# Patient Record
Sex: Female | Born: 1960 | Race: White | Hispanic: No | Marital: Married | Smoking: Never smoker
Health system: Southern US, Community
[De-identification: ages and names within clinical notes are randomized; demographics above are authoritative.]

## PROBLEM LIST (undated history)

## (undated) DIAGNOSIS — L309 Dermatitis, unspecified: Secondary | ICD-10-CM

## (undated) DIAGNOSIS — Z9289 Personal history of other medical treatment: Secondary | ICD-10-CM

## (undated) DIAGNOSIS — I10 Essential (primary) hypertension: Secondary | ICD-10-CM

## (undated) DIAGNOSIS — B029 Zoster without complications: Secondary | ICD-10-CM

## (undated) DIAGNOSIS — K649 Unspecified hemorrhoids: Secondary | ICD-10-CM

## (undated) DIAGNOSIS — Z8041 Family history of malignant neoplasm of ovary: Secondary | ICD-10-CM

## (undated) DIAGNOSIS — Z1379 Encounter for other screening for genetic and chromosomal anomalies: Secondary | ICD-10-CM

## (undated) DIAGNOSIS — L409 Psoriasis, unspecified: Secondary | ICD-10-CM

## (undated) HISTORY — DX: Personal history of other medical treatment: Z92.89

## (undated) HISTORY — DX: Unspecified hemorrhoids: K64.9

## (undated) HISTORY — DX: Zoster without complications: B02.9

## (undated) HISTORY — DX: Dermatitis, unspecified: L30.9

## (undated) HISTORY — DX: Essential (primary) hypertension: I10

## (undated) HISTORY — DX: Psoriasis, unspecified: L40.9

## (undated) HISTORY — DX: Encounter for other screening for genetic and chromosomal anomalies: Z13.79

## (undated) HISTORY — DX: Family history of malignant neoplasm of ovary: Z80.41

---

## 2004-11-10 ENCOUNTER — Ambulatory Visit: Payer: Self-pay | Admitting: Unknown Physician Specialty

## 2004-11-26 ENCOUNTER — Ambulatory Visit: Payer: Self-pay | Admitting: Unknown Physician Specialty

## 2005-07-05 ENCOUNTER — Ambulatory Visit: Payer: Self-pay | Admitting: Unknown Physician Specialty

## 2005-12-13 ENCOUNTER — Ambulatory Visit: Payer: Self-pay | Admitting: Unknown Physician Specialty

## 2006-10-24 ENCOUNTER — Emergency Department: Payer: Self-pay | Admitting: Emergency Medicine

## 2006-12-16 ENCOUNTER — Ambulatory Visit: Payer: Self-pay | Admitting: Unknown Physician Specialty

## 2007-10-08 ENCOUNTER — Emergency Department: Payer: Self-pay | Admitting: Emergency Medicine

## 2013-07-19 ENCOUNTER — Ambulatory Visit: Payer: Self-pay | Admitting: Podiatry

## 2013-07-25 ENCOUNTER — Encounter: Payer: Self-pay | Admitting: Podiatry

## 2013-07-25 ENCOUNTER — Ambulatory Visit (INDEPENDENT_AMBULATORY_CARE_PROVIDER_SITE_OTHER): Payer: 59 | Admitting: Podiatry

## 2013-07-25 VITALS — BP 144/97 | HR 76 | Resp 16 | Ht 67.0 in | Wt 190.0 lb

## 2013-07-25 DIAGNOSIS — Z79899 Other long term (current) drug therapy: Secondary | ICD-10-CM

## 2013-07-25 MED ORDER — TERBINAFINE HCL 250 MG PO TABS: 250.0000 mg | ORAL_TABLET | Freq: Every day | ORAL | Status: DC

## 2013-07-25 NOTE — Progress Notes (Signed)
Here to get results of the toenail that was sent off and to find out what the next step is.  Objective: No change in the onychomycosis of the nail.  Assessment onychomycosis confirmed by mycology report.  Plan: We discussed the etiology pathology conservative versus surgical therapies. We discussed the use of orals versus topicals we also discussed the use of laser. At this point we have decided oral medication will be best for her. She will be given 250 mg of Lamisil 30 in number she will take 1 at night. She will also have liver profile and CBC performed for [place] I will followup with her in one month for another CBC and liver profile at which time we will right a prescription for 90 tablets. Should her blood work come back abnormal initially we will call her immediately.

## 2013-07-26 LAB — HEPATIC FUNCTION PANEL: Bilirubin, Direct: 0.16 mg/dL (ref 0.00–0.40)

## 2013-07-26 LAB — CBC WITH DIFFERENTIAL/PLATELET
Basophils Absolute: 0 10*3/uL (ref 0.0–0.2)
Basos: 0 %
EOS ABS: 0.1 10*3/uL (ref 0.0–0.4)
EOS: 1 %
HCT: 40.9 % (ref 34.0–46.6)
Hemoglobin: 13.9 g/dL (ref 11.1–15.9)
IMMATURE GRANS (ABS): 0 10*3/uL (ref 0.0–0.1)
Immature Granulocytes: 0 %
LYMPHS ABS: 1.7 10*3/uL (ref 0.7–3.1)
Lymphs: 25 %
MCH: 29.6 pg (ref 26.6–33.0)
MCHC: 34 g/dL (ref 31.5–35.7)
MCV: 87 fL (ref 79–97)
MONOS ABS: 0.6 10*3/uL (ref 0.1–0.9)
Monocytes: 8 %
NEUTROS ABS: 4.4 10*3/uL (ref 1.4–7.0)
Neutrophils Relative %: 66 %
RBC: 4.69 x10E6/uL (ref 3.77–5.28)
RDW: 14.5 % (ref 12.3–15.4)
WBC: 6.9 10*3/uL (ref 3.4–10.8)

## 2013-08-02 ENCOUNTER — Telehealth: Payer: Self-pay | Admitting: *Deleted

## 2013-08-02 NOTE — Telephone Encounter (Signed)
SPOKE TO PATIENT LETTING HER KNOW BLOODWORK WAS OK

## 2013-08-22 ENCOUNTER — Ambulatory Visit: Payer: 59 | Admitting: Podiatry

## 2013-08-23 ENCOUNTER — Ambulatory Visit (INDEPENDENT_AMBULATORY_CARE_PROVIDER_SITE_OTHER): Payer: 59 | Admitting: Podiatry

## 2013-08-23 VITALS — Resp 16 | Ht 67.0 in | Wt 191.0 lb

## 2013-08-23 DIAGNOSIS — Z79899 Other long term (current) drug therapy: Secondary | ICD-10-CM

## 2013-08-23 MED ORDER — TERBINAFINE HCL 250 MG PO TABS: 250.0000 mg | ORAL_TABLET | Freq: Every day | ORAL | Status: DC

## 2013-08-23 NOTE — Progress Notes (Signed)
She presents today for followup of her Lamisil therapy. She states that everything seems to be doing great of the nail is looking better. The a problem and she relates having his knee pain which is relieved by anti-inflammatories.  Objective: Vital signs are stable she is alert and oriented x3 there does appear to be nail changes to the hallux right.  Assessment: Long-term therapy with 4 onychomycosis with Lamisil.  Plan: Send her for another liver profile and CBC. She was prescribed another 90 days of Lamisil 250 mg tablets one by mouth daily. I will followup with her in 4 months. We will notify her with her results should be liver enzymes be elevated.

## 2013-08-24 LAB — HEPATIC FUNCTION PANEL
ALBUMIN: 4.3 g/dL (ref 3.5–5.5)
ALK PHOS: 67 IU/L (ref 39–117)
ALT: 10 IU/L (ref 0–32)
AST: 14 IU/L (ref 0–40)
Bilirubin, Direct: 0.17 mg/dL (ref 0.00–0.40)
Total Bilirubin: 0.6 mg/dL (ref 0.0–1.2)
Total Protein: 6.4 g/dL (ref 6.0–8.5)

## 2013-08-24 LAB — CBC WITH DIFFERENTIAL/PLATELET
Basophils Absolute: 0 10*3/uL (ref 0.0–0.2)
Basos: 1 %
Eos: 2 %
Eosinophils Absolute: 0.1 10*3/uL (ref 0.0–0.4)
HEMATOCRIT: 41.2 % (ref 34.0–46.6)
Hemoglobin: 13.5 g/dL (ref 11.1–15.9)
Immature Grans (Abs): 0 10*3/uL (ref 0.0–0.1)
Immature Granulocytes: 0 %
LYMPHS ABS: 0.8 10*3/uL (ref 0.7–3.1)
Lymphs: 17 %
MCH: 29.5 pg (ref 26.6–33.0)
MCHC: 32.8 g/dL (ref 31.5–35.7)
MCV: 90 fL (ref 79–97)
MONOCYTES: 14 %
MONOS ABS: 0.7 10*3/uL (ref 0.1–0.9)
NEUTROS ABS: 3.3 10*3/uL (ref 1.4–7.0)
Neutrophils Relative %: 66 %
RBC: 4.58 x10E6/uL (ref 3.77–5.28)
RDW: 14.9 % (ref 12.3–15.4)
WBC: 4.9 10*3/uL (ref 3.4–10.8)

## 2013-08-28 ENCOUNTER — Telehealth: Payer: Self-pay | Admitting: *Deleted

## 2013-08-28 NOTE — Telephone Encounter (Signed)
Message copied by Laury Axon on Tue Aug 28, 2013 10:24 AM ------      Message from: Tyson Dense T      Created: Tue Aug 28, 2013  7:05 AM       Blood work looks great. Continue with meds. ------

## 2013-08-28 NOTE — Telephone Encounter (Signed)
Left message letting pt know per dr Milinda Pointer blood work looks great and continue with medication.

## 2013-12-26 ENCOUNTER — Ambulatory Visit: Payer: 59 | Admitting: Podiatry

## 2014-01-07 ENCOUNTER — Ambulatory Visit (INDEPENDENT_AMBULATORY_CARE_PROVIDER_SITE_OTHER): Payer: 59 | Admitting: Podiatry

## 2014-01-07 VITALS — BP 123/80 | HR 68 | Resp 16

## 2014-01-07 DIAGNOSIS — Z79899 Other long term (current) drug therapy: Secondary | ICD-10-CM

## 2014-01-07 MED ORDER — TAVABOROLE 5 % EX SOLN: CUTANEOUS | Status: DC

## 2014-01-07 NOTE — Progress Notes (Signed)
She states that she had to stop taking her Lamisil: Started to bother her knees.  Objective: No change in the nail plate as of yet.  Assessment: Long-term therapy for the treatment onychomycosis we were using Lamisil but has discontinued it  Plan: Started her on a topical and I will followup with her in 6 months. If she would like to try the oral once again we will send her for another blood work

## 2014-05-24 DIAGNOSIS — K649 Unspecified hemorrhoids: Secondary | ICD-10-CM

## 2014-05-24 HISTORY — DX: Unspecified hemorrhoids: K64.9

## 2014-07-10 ENCOUNTER — Ambulatory Visit: Payer: 59 | Admitting: Podiatry

## 2014-09-22 DIAGNOSIS — Z1379 Encounter for other screening for genetic and chromosomal anomalies: Secondary | ICD-10-CM

## 2014-09-22 HISTORY — DX: Encounter for other screening for genetic and chromosomal anomalies: Z13.79

## 2014-10-18 DIAGNOSIS — Z9289 Personal history of other medical treatment: Secondary | ICD-10-CM

## 2014-10-18 DIAGNOSIS — Z8041 Family history of malignant neoplasm of ovary: Secondary | ICD-10-CM

## 2014-10-18 HISTORY — DX: Personal history of other medical treatment: Z92.89

## 2014-10-18 HISTORY — DX: Family history of malignant neoplasm of ovary: Z80.41

## 2014-10-18 LAB — HM PAP SMEAR: HM Pap smear: NEGATIVE

## 2014-10-22 ENCOUNTER — Encounter: Payer: Self-pay | Admitting: *Deleted

## 2014-10-23 HISTORY — PX: BREAST BIOPSY: SHX20

## 2014-10-24 ENCOUNTER — Other Ambulatory Visit: Payer: Self-pay | Admitting: Obstetrics and Gynecology

## 2014-10-24 DIAGNOSIS — R928 Other abnormal and inconclusive findings on diagnostic imaging of breast: Secondary | ICD-10-CM

## 2014-10-30 ENCOUNTER — Ambulatory Visit
Admission: RE | Admit: 2014-10-30 | Discharge: 2014-10-30 | Disposition: A | Discharge status: Home / Self Care | Payer: 59 | Source: Ambulatory Visit | Attending: Obstetrics and Gynecology | Admitting: Obstetrics and Gynecology

## 2014-10-30 ENCOUNTER — Encounter: Payer: Self-pay | Admitting: General Surgery

## 2014-10-30 ENCOUNTER — Ambulatory Visit (INDEPENDENT_AMBULATORY_CARE_PROVIDER_SITE_OTHER): Payer: 59 | Admitting: General Surgery

## 2014-10-30 VITALS — BP 128/80 | HR 78 | Resp 14 | Ht 65.0 in | Wt 170.0 lb

## 2014-10-30 DIAGNOSIS — N6002 Solitary cyst of left breast: Secondary | ICD-10-CM | POA: Diagnosis not present

## 2014-10-30 DIAGNOSIS — R928 Other abnormal and inconclusive findings on diagnostic imaging of breast: Secondary | ICD-10-CM | POA: Diagnosis present

## 2014-10-30 DIAGNOSIS — Z1211 Encounter for screening for malignant neoplasm of colon: Secondary | ICD-10-CM

## 2014-10-30 DIAGNOSIS — K649 Unspecified hemorrhoids: Secondary | ICD-10-CM | POA: Diagnosis not present

## 2014-10-30 DIAGNOSIS — K625 Hemorrhage of anus and rectum: Secondary | ICD-10-CM

## 2014-10-30 LAB — POC HEMOCCULT BLD/STL (OFFICE/1-CARD/DIAGNOSTIC): FECAL OCCULT BLD: NEGATIVE

## 2014-10-30 MED ORDER — POLYETHYLENE GLYCOL 3350 17 GM/SCOOP PO POWD: 1.0000 | Freq: Once | ORAL | Status: DC

## 2014-10-30 NOTE — Patient Instructions (Addendum)
Colonoscopy A colonoscopy is an exam to look at the entire large intestine (colon). This exam can help find problems such as tumors, polyps, inflammation, and areas of bleeding. The exam takes about 1 hour.  LET [facility] CARE PROVIDER KNOW ABOUT:   Any allergies you have.  All medicines you are taking, including vitamins, herbs, eye drops, creams, and over-the-counter medicines.  Previous problems you or members of your family have had with the use of anesthetics.  Any blood disorders you have.  Previous surgeries you have had.  Medical conditions you have. RISKS AND COMPLICATIONS  Generally, this is a safe procedure. However, as with any procedure, complications can occur. Possible complications include:  Bleeding.  Tearing or rupture of the colon wall.  Reaction to medicines given during the exam.  Infection (rare). BEFORE THE PROCEDURE   Ask your health care provider about changing or stopping your regular medicines.  You may be prescribed an oral bowel prep. This involves drinking a large amount of medicated liquid, starting the day before your procedure. The liquid will cause you to have multiple loose stools until your stool is almost clear or light green. This cleans out your colon in preparation for the procedure.  Do not eat or drink anything else once you have started the bowel prep, unless your health care provider tells you it is safe to do so.  Arrange for someone to drive you home after the procedure. PROCEDURE   You will be given medicine to help you relax (sedative).  You will lie on your side with your knees bent.  A long, flexible tube with a light and camera on the end (colonoscope) will be inserted through the rectum and into the colon. The camera sends video back to a computer screen as it moves through the colon. The colonoscope also releases carbon dioxide gas to inflate the colon. This helps your health care provider see the area better.  During  the exam, your health care provider may take a small tissue sample (biopsy) to be examined under a microscope if any abnormalities are found.  The exam is finished when the entire colon has been viewed. AFTER THE PROCEDURE   Do not drive for 24 hours after the exam.  You may have a small amount of blood in your stool.  You may pass moderate amounts of gas and have mild abdominal cramping or bloating. This is caused by the gas used to inflate your colon during the exam.  Ask when your test results will be ready and how you will get your results. Make sure you get your test results. Document Released: 05/07/2000 Document Revised: 02/28/2013 Document Reviewed: 01/15/2013 [facility] Patient Information 2015 [place], [place]. This information is not intended to replace advice given to you by your health care provider. Make sure you discuss any questions you have with your health care provider.  Patient is scheduled for a colonoscopy and surgery at [facility] on 11/20/14. She will pre admit by phone. Patient is aware to pre register with the hospital at least two days prior. She will stop her fish oil one week prior. Miralax prescription has been sent into her pharmacy. Patient is aware of date and instructions.

## 2014-10-30 NOTE — Progress Notes (Signed)
Patient ID: Amber Henson, female   DOB: 1960/11/19, 53 y.o.   MRN: [id number]  Chief Complaint  Patient presents with  . Colonoscopy    HPI Amber Henson is a 54 y.o. female.  Here today to discuss having a colonoscopy. Bowels move every other day. She has noticed occasional bleeding of hemorrhoids with straining. She does admit to some rectal itching from the external hemorrhoids. She was seen here in 2008 for hemorrhoids.  Denies any gastrointestinal issues.   HPI  No past medical history on file.  No past surgical history on file.  Family History  Problem Relation Age of Onset  . Diabetes Father   . Hypertension Father   . Hypertension Mother   . Colonic polyp Mother     Social History History  Substance Use Topics  . Smoking status: Never Smoker   . Smokeless tobacco: Never Used  . Alcohol Use: No    No Known Allergies  Current Outpatient Prescriptions  Medication Sig Dispense Refill  . Multiple Vitamin (MULTIVITAMIN) capsule Take 1 capsule by mouth daily.    . Omega-3 Fatty Acids (FISH OIL PO) Take 1 capsule by mouth daily.    . polyethylene glycol powder (GLYCOLAX/MIRALAX) powder Take 255 g by mouth once. 255 g 0   No current facility-administered medications for this visit.    Review of Systems Review of Systems  Constitutional: Negative.   Respiratory: Negative.   Cardiovascular: Negative.   Gastrointestinal: Positive for anal bleeding. Negative for nausea, vomiting, diarrhea and constipation.    Blood pressure 128/80, pulse 78, resp. rate 14, height 5\' 5"  (1.651 m), weight 170 lb (77.111 kg), last menstrual period 10/17/2014.  Physical Exam Physical Exam  Constitutional: She is oriented to person, place, and time. She appears well-developed and well-nourished.  Neck: Neck supple.  Cardiovascular: Normal rate, regular rhythm and normal heart sounds.   Pulmonary/Chest: Effort normal and breath sounds normal.  Abdominal: Soft. Normal appearance. There  is no tenderness. A hernia is present.    Umbilical hernia present.  Genitourinary: Rectal exam shows external hemorrhoid. Guaiac negative stool.  Rectal exam shows pronounced redunadant skin. skin tags at 09-27-08 . Anal polyp at   5:00 position. Likely inflammatory.  Lymphadenopathy:    She has no cervical adenopathy.  Neurological: She is alert and oriented to person, place, and time.  Skin: Skin is warm and dry.    Data Reviewed Office notes of Ardeth Perfect, [place] dated 10/18/2014. Fourth degree hemorrhoids reported. This is not correct.  My office notes of 09/08/2006 were reviewed. Prominent anal skin tags, minimal hemorrhoidal tissue noted at that time.  Assessment    Candidate for screening colonoscopy.  Symptomatic external anal skin tags, minimal hemorrhoidal disease.    Plan    Due to the difficulty with perianal care, excision is warranted. Potential for pain, bleeding and disturbances with defecation were reviewed.  Patient has been discouraged from using soap as part of her perianal cleansing routine.    Excision of anal tag at time of colonoscopy.  Colonoscopy with possible biopsy/polypectomy prn: Information regarding the procedure, including its potential risks and complications (including but not limited to perforation of the bowel, which may require emergency surgery to repair, and bleeding) was verbally given to the patient. Educational information regarding lower instestinal endoscopy was given to the patient. Written instructions for how to complete the bowel prep using Miralax were provided. The importance of drinking ample fluids to avoid dehydration as a result of the  prep emphasized.  Patient is scheduled for a colonoscopy and surgery at [facility] on 11/20/14. She will pre admit by phone. Patient is aware to pre register with the hospital at least two days prior. She will stop her fish oil one week prior. Miralax prescription has been sent into her pharmacy. Patient  is aware of date and instructions.   PCP:  No Pcp  Ref Tamsen Meek 11/01/2014, 1:16 PM

## 2014-11-01 DIAGNOSIS — K625 Hemorrhage of anus and rectum: Secondary | ICD-10-CM | POA: Insufficient documentation

## 2014-11-01 DIAGNOSIS — Z1211 Encounter for screening for malignant neoplasm of colon: Secondary | ICD-10-CM | POA: Insufficient documentation

## 2014-11-01 DIAGNOSIS — K649 Unspecified hemorrhoids: Secondary | ICD-10-CM | POA: Insufficient documentation

## 2014-11-01 NOTE — H&P (Signed)
Patient ID: Amber Henson, female DOB: 1961/02/05, 54 y.o. MRN: [id number]  Chief Complaint   Patient presents with   .  Colonoscopy    HPI  Amber Henson is a 54 y.o. female. Here today to discuss having a colonoscopy. Bowels move every other day. She has noticed occasional bleeding of hemorrhoids with straining. She does admit to some rectal itching from the external hemorrhoids. She was seen here in 2008 for hemorrhoids.  Denies any gastrointestinal issues.  HPI  No past medical history on file.  No past surgical history on file.  Family History   Problem  Relation  Age of Onset   .  Diabetes  Father    .  Hypertension  Father    .  Hypertension  Mother    .  Colonic polyp  Mother     Social History  History   Substance Use Topics   .  Smoking status:  Never Smoker   .  Smokeless tobacco:  Never Used   .  Alcohol Use:  No    No Known Allergies  Current Outpatient Prescriptions   Medication  Sig  Dispense  Refill   .  Multiple Vitamin (MULTIVITAMIN) capsule  Take 1 capsule by mouth daily.     .  Omega-3 Fatty Acids (FISH OIL PO)  Take 1 capsule by mouth daily.     .  polyethylene glycol powder (GLYCOLAX/MIRALAX) powder  Take 255 g by mouth once.  255 g  0    No current facility-administered medications for this visit.    Review of Systems  Review of Systems  Constitutional: Negative.  Respiratory: Negative.  Cardiovascular: Negative.  Gastrointestinal: Positive for anal bleeding. Negative for nausea, vomiting, diarrhea and constipation.   Blood pressure 128/80, pulse 78, resp. rate 14, height 5\' 5"  (1.651 m), weight 170 lb (77.111 kg), last menstrual period 10/17/2014.  Physical Exam  Physical Exam  Constitutional: She is oriented to person, place, and time. She appears well-developed and well-nourished.  Neck: Neck supple.  Cardiovascular: Normal rate, regular rhythm and normal heart sounds.  Pulmonary/Chest: Effort normal and breath sounds normal.  Abdominal: Soft.  Normal appearance. There is no tenderness. A hernia is present.    Umbilical hernia present.  Genitourinary: Rectal exam shows external hemorrhoid. Guaiac negative stool.  Rectal exam shows pronounced redunadant skin. skin tags at 09-27-08 . Anal polyp at 5:00 position. Likely inflammatory.  Lymphadenopathy:  She has no cervical adenopathy.  Neurological: She is alert and oriented to person, place, and time.  Skin: Skin is warm and dry.   Data Reviewed  Office notes of Ardeth Perfect, [place] dated 10/18/2014. Fourth degree hemorrhoids reported. This is not correct.  My office notes of 09/08/2006 were reviewed. Prominent anal skin tags, minimal hemorrhoidal tissue noted at that time.  Assessment   Candidate for screening colonoscopy.  Symptomatic external anal skin tags, minimal hemorrhoidal disease.   Plan   Due to the difficulty with perianal care, excision is warranted. Potential for pain, bleeding and disturbances with defecation were reviewed.  Patient has been discouraged from using soap as part of her perianal cleansing routine.   Excision of anal tag at time of colonoscopy.  Colonoscopy with possible biopsy/polypectomy prn: Information regarding the procedure, including its potential risks and complications (including but not limited to perforation of the bowel, which may require emergency surgery to repair, and bleeding) was verbally given to the patient. Educational information regarding lower instestinal endoscopy was given to the  patient. Written instructions for how to complete the bowel prep using Miralax were provided. The importance of drinking ample fluids to avoid dehydration as a result of the prep emphasized.  Patient is scheduled for a colonoscopy and surgery at [facility] on 11/20/14. She will pre admit by phone. Patient is aware to pre register with the hospital at least two days prior. She will stop her fish oil one week prior. Miralax prescription has been sent into her pharmacy.  Patient is aware of date and instructions.  PCP: No Pcp  Ref Tamsen Meek  11/01/2014, 1:16 PM

## 2014-11-04 ENCOUNTER — Other Ambulatory Visit: Payer: Self-pay | Admitting: Obstetrics and Gynecology

## 2014-11-04 DIAGNOSIS — R928 Other abnormal and inconclusive findings on diagnostic imaging of breast: Secondary | ICD-10-CM

## 2014-11-05 ENCOUNTER — Other Ambulatory Visit: Payer: Self-pay | Admitting: Obstetrics and Gynecology

## 2014-11-05 DIAGNOSIS — R928 Other abnormal and inconclusive findings on diagnostic imaging of breast: Secondary | ICD-10-CM

## 2014-11-05 DIAGNOSIS — N6002 Solitary cyst of left breast: Secondary | ICD-10-CM

## 2014-11-07 ENCOUNTER — Ambulatory Visit
Admission: RE | Admit: 2014-11-07 | Discharge: 2014-11-07 | Disposition: A | Discharge status: Home / Self Care | Payer: 59 | Source: Ambulatory Visit | Attending: Obstetrics and Gynecology | Admitting: Obstetrics and Gynecology

## 2014-11-07 ENCOUNTER — Other Ambulatory Visit: Payer: Self-pay | Admitting: Obstetrics and Gynecology

## 2014-11-07 ENCOUNTER — Ambulatory Visit: Payer: 59

## 2014-11-07 DIAGNOSIS — R928 Other abnormal and inconclusive findings on diagnostic imaging of breast: Secondary | ICD-10-CM

## 2014-11-07 DIAGNOSIS — N63 Unspecified lump in breast: Secondary | ICD-10-CM | POA: Insufficient documentation

## 2014-11-07 DIAGNOSIS — C50912 Malignant neoplasm of unspecified site of left female breast: Secondary | ICD-10-CM

## 2014-11-11 LAB — SURGICAL PATHOLOGY

## 2014-11-13 ENCOUNTER — Other Ambulatory Visit: Payer: 59

## 2014-11-14 ENCOUNTER — Encounter: Payer: Self-pay | Admitting: *Deleted

## 2014-11-14 DIAGNOSIS — Z8371 Family history of colonic polyps: Secondary | ICD-10-CM | POA: Diagnosis not present

## 2014-11-14 DIAGNOSIS — Z1211 Encounter for screening for malignant neoplasm of colon: Secondary | ICD-10-CM | POA: Diagnosis not present

## 2014-11-14 DIAGNOSIS — Z8249 Family history of ischemic heart disease and other diseases of the circulatory system: Secondary | ICD-10-CM | POA: Diagnosis not present

## 2014-11-14 DIAGNOSIS — K644 Residual hemorrhoidal skin tags: Secondary | ICD-10-CM | POA: Diagnosis present

## 2014-11-14 DIAGNOSIS — K5730 Diverticulosis of large intestine without perforation or abscess without bleeding: Secondary | ICD-10-CM | POA: Diagnosis not present

## 2014-11-14 DIAGNOSIS — Z833 Family history of diabetes mellitus: Secondary | ICD-10-CM | POA: Diagnosis not present

## 2014-11-14 NOTE — Patient Instructions (Signed)
  Your procedure is scheduled on 11/20/14 Report to Day Surgery. To find out your arrival time please call [phone] between 1PM - 3PM on 11/19/14.  Remember: Instructions that are not followed completely may result in serious medical risk, up to and including death, or upon the discretion of your surgeon and anesthesiologist your surgery may need to be rescheduled.    _x___ 1. Do not eat food or drink liquids after midnight. No gum chewing or hard candies.     _x___ 2. No Alcohol for 24 hours before or after surgery.   ____ 3. Bring all medications with you on the day of surgery if instructed.    __x__ 4. Notify your doctor if there is any change in your medical condition     (cold, fever, infections).     Do not wear jewelry, make-up, hairpins, clips or nail polish.  Do not wear lotions, powders, or perfumes. You may wear deodorant.  Do not shave 48 hours prior to surgery. Men may shave face and neck.  Do not bring valuables to the hospital.    [facility] is not responsible for any belongings or valuables.               Contacts, dentures or bridgework may not be worn into surgery.  Leave your suitcase in the car. After surgery it may be brought to your room.  For patients admitted to the hospital, discharge time is determined by your                treatment team.   Patients discharged the day of surgery will not be allowed to drive home.   Please read over the following fact sheets that you were given:   Surgical Site Infection Prevention   ____ Take these medicines the morning of surgery with A SIP OF WATER:    1.   2.   3.   4.  5.  6.  ____ Fleet Enema (as directed)   ____ Use CHG Soap as directed  ____ Use inhalers on the day of surgery  ____ Stop metformin 2 days prior to surgery    ____ Take 1/2 of usual insulin dose the night before surgery and none on the morning of surgery.   ____ Stop Coumadin/Plavix/aspirin on  ____ Stop Anti-inflammatories  on   ____ Stop supplements until after surgery.    ____ Bring C-Pap to the hospital.

## 2014-11-20 ENCOUNTER — Encounter: Payer: Self-pay | Admitting: General Surgery

## 2014-11-20 ENCOUNTER — Ambulatory Visit: Payer: 59 | Admitting: Anesthesiology

## 2014-11-20 ENCOUNTER — Ambulatory Visit
Admission: RE | Admit: 2014-11-20 | Discharge: 2014-11-20 | Disposition: A | Discharge status: Home / Self Care | Payer: 59 | Source: Ambulatory Visit | Attending: General Surgery | Admitting: General Surgery

## 2014-11-20 ENCOUNTER — Encounter
Admission: RE | Disposition: A | Discharge status: Home / Self Care | Payer: Self-pay | Source: Ambulatory Visit | Attending: General Surgery

## 2014-11-20 DIAGNOSIS — Z1211 Encounter for screening for malignant neoplasm of colon: Secondary | ICD-10-CM | POA: Insufficient documentation

## 2014-11-20 DIAGNOSIS — K649 Unspecified hemorrhoids: Secondary | ICD-10-CM

## 2014-11-20 DIAGNOSIS — Z8371 Family history of colonic polyps: Secondary | ICD-10-CM | POA: Insufficient documentation

## 2014-11-20 DIAGNOSIS — K5730 Diverticulosis of large intestine without perforation or abscess without bleeding: Secondary | ICD-10-CM | POA: Insufficient documentation

## 2014-11-20 DIAGNOSIS — Z833 Family history of diabetes mellitus: Secondary | ICD-10-CM | POA: Insufficient documentation

## 2014-11-20 DIAGNOSIS — K625 Hemorrhage of anus and rectum: Secondary | ICD-10-CM | POA: Diagnosis not present

## 2014-11-20 DIAGNOSIS — Z8249 Family history of ischemic heart disease and other diseases of the circulatory system: Secondary | ICD-10-CM | POA: Insufficient documentation

## 2014-11-20 HISTORY — PX: COLONOSCOPY: SHX5424

## 2014-11-20 HISTORY — PX: HEMORRHOID SURGERY: SHX153

## 2014-11-20 LAB — POCT PREGNANCY, URINE: Preg Test, Ur: NEGATIVE

## 2014-11-20 SURGERY — HEMORRHOIDECTOMY
Anesthesia: General

## 2014-11-20 SURGERY — COLONOSCOPY
Anesthesia: General

## 2014-11-20 MED ORDER — LACTATED RINGERS IV SOLN
INTRAVENOUS | Status: DC
  Administered 2014-11-20: 10:00:00 via INTRAVENOUS

## 2014-11-20 MED ORDER — CEFOXITIN SODIUM-DEXTROSE 2-2.2 GM-% IV SOLR (PREMIX)
INTRAVENOUS | Status: AC
  Administered 2014-11-20: 2 g via INTRAVENOUS
  Filled 2014-11-20: qty 50

## 2014-11-20 MED ORDER — SODIUM CHLORIDE 0.9 % IV SOLN
INTRAVENOUS | Status: DC | PRN
  Administered 2014-11-20 (×2): via INTRAVENOUS

## 2014-11-20 MED ORDER — PROPOFOL INFUSION 10 MG/ML OPTIME
INTRAVENOUS | Status: DC | PRN
  Administered 2014-11-20: 100 ug/kg/min via INTRAVENOUS

## 2014-11-20 MED ORDER — DEXAMETHASONE SODIUM PHOSPHATE 4 MG/ML IJ SOLN
INTRAMUSCULAR | Status: DC | PRN
  Administered 2014-11-20: 8 mg via INTRAVENOUS

## 2014-11-20 MED ORDER — ACETAMINOPHEN 10 MG/ML IV SOLN
INTRAVENOUS | Status: DC | PRN
  Administered 2014-11-20: 1000 mg via INTRAVENOUS

## 2014-11-20 MED ORDER — BUPIVACAINE LIPOSOME 1.3 % IJ SUSP
INTRAMUSCULAR | Status: AC
  Filled 2014-11-20: qty 20

## 2014-11-20 MED ORDER — OXYCODONE-ACETAMINOPHEN 5-325 MG PO TABS: 1.0000 | ORAL_TABLET | ORAL | Status: DC | PRN

## 2014-11-20 MED ORDER — FENTANYL CITRATE (PF) 100 MCG/2ML IJ SOLN
INTRAMUSCULAR | Status: DC | PRN
  Administered 2014-11-20: 50 ug via INTRAVENOUS

## 2014-11-20 MED ORDER — FAMOTIDINE 20 MG PO TABS
ORAL_TABLET | ORAL | Status: AC
  Administered 2014-11-20: 20 mg
  Filled 2014-11-20: qty 1

## 2014-11-20 MED ORDER — DEXTROSE 5 % IV SOLN
2.0000 g | INTRAVENOUS | Status: DC
  Filled 2014-11-20: qty 2

## 2014-11-20 MED ORDER — LIDOCAINE-EPINEPHRINE (PF) 1 %-1:200000 IJ SOLN
INTRAMUSCULAR | Status: AC
  Filled 2014-11-20: qty 30

## 2014-11-20 MED ORDER — FENTANYL CITRATE (PF) 100 MCG/2ML IJ SOLN
INTRAMUSCULAR | Status: DC | PRN
  Administered 2014-11-20 (×3): 50 ug via INTRAVENOUS
  Administered 2014-11-20: 25 ug via INTRAVENOUS

## 2014-11-20 MED ORDER — ACETAMINOPHEN 10 MG/ML IV SOLN
INTRAVENOUS | Status: AC
  Filled 2014-11-20: qty 100

## 2014-11-20 MED ORDER — SODIUM CHLORIDE 0.9 % IV SOLN
INTRAVENOUS | Status: DC | PRN
  Administered 2014-11-20: 10:00:00 via INTRAVENOUS

## 2014-11-20 MED ORDER — LIDOCAINE-EPINEPHRINE (PF) 1 %-1:200000 IJ SOLN
INTRAMUSCULAR | Status: DC | PRN
  Administered 2014-11-20: 30 mL

## 2014-11-20 MED ORDER — ONDANSETRON HCL 4 MG/2ML IJ SOLN
INTRAMUSCULAR | Status: DC | PRN
Start: 2014-11-20 — End: 2014-11-20
  Administered 2014-11-20: 4 mg via INTRAVENOUS

## 2014-11-20 MED ORDER — PROPOFOL 10 MG/ML IV BOLUS
INTRAVENOUS | Status: DC | PRN
  Administered 2014-11-20: 50 mg via INTRAVENOUS

## 2014-11-20 MED ORDER — KETOROLAC TROMETHAMINE 30 MG/ML IJ SOLN
INTRAMUSCULAR | Status: DC | PRN
  Administered 2014-11-20: 30 mg via INTRAVENOUS

## 2014-11-20 MED ORDER — BUPIVACAINE LIPOSOME 1.3 % IJ SUSP
INTRAMUSCULAR | Status: DC | PRN
  Administered 2014-11-20: 20 mL

## 2014-11-20 MED ORDER — PROPOFOL 10 MG/ML IV BOLUS
INTRAVENOUS | Status: DC | PRN
  Administered 2014-11-20: 130 mg via INTRAVENOUS

## 2014-11-20 MED ORDER — LIDOCAINE HCL (CARDIAC) 20 MG/ML IV SOLN
INTRAVENOUS | Status: DC | PRN
  Administered 2014-11-20: 60 mg via INTRAVENOUS

## 2014-11-20 MED ORDER — FAMOTIDINE 20 MG PO TABS
20.0000 mg | ORAL_TABLET | Freq: Once | ORAL | Status: DC
  Filled 2014-11-20: qty 1

## 2014-11-20 MED ORDER — BUPIVACAINE-EPINEPHRINE (PF) 0.5% -1:200000 IJ SOLN
INTRAMUSCULAR | Status: AC
  Filled 2014-11-20: qty 30

## 2014-11-20 MED ORDER — LIDOCAINE HCL (PF) 2 % IJ SOLN
INTRAMUSCULAR | Status: DC | PRN
  Administered 2014-11-20: 50 mg

## 2014-11-20 SURGICAL SUPPLY — 27 items
BLADE SURG 15 STRL SS SAFETY (BLADE) ×1 IMPLANT
BRIEF STRETCH MATERNITY 2XLG (MISCELLANEOUS) ×2 IMPLANT
CANISTER SUCT 1200ML W/VALVE (MISCELLANEOUS) ×2 IMPLANT
DRAPE LAPAROTOMY 100X77 ABD (DRAPES) ×2 IMPLANT
DRAPE LEGGINS SURG 28X43 STRL (DRAPES) ×2 IMPLANT
DRAPE UNDER BUTTOCK W/FLU (DRAPES) ×2 IMPLANT
DRSG GAUZE PETRO 6X36 STRIP ST (GAUZE/BANDAGES/DRESSINGS) ×2 IMPLANT
GLOVE BIO SURGEON STRL SZ7.5 (GLOVE) ×2 IMPLANT
GLOVE INDICATOR 8.0 STRL GRN (GLOVE) ×2 IMPLANT
GOWN STRL REUS W/ TWL LRG LVL3 (GOWN DISPOSABLE) ×2 IMPLANT
GOWN STRL REUS W/TWL LRG LVL3 (GOWN DISPOSABLE) ×4
HARMONIC SCALPEL FOCUS (MISCELLANEOUS) ×1 IMPLANT
JELLY LUB 2OZ STRL (MISCELLANEOUS) ×1
JELLY LUBE 2OZ STRL (MISCELLANEOUS) ×1 IMPLANT
KIT RM TURNOVER STRD PROC AR (KITS) ×2 IMPLANT
LABEL OR SOLS (LABEL) ×2 IMPLANT
NDL SAFETY 22GX1.5 (NEEDLE) ×2 IMPLANT
NDL SAFETY 25GX1.5 (NEEDLE) ×2 IMPLANT
PACK BASIN MINOR ARMC (MISCELLANEOUS) ×2 IMPLANT
PAD GROUND ADULT SPLIT (MISCELLANEOUS) ×2 IMPLANT
PAD OB MATERNITY 4.3X12.25 (PERSONAL CARE ITEMS) ×2 IMPLANT
PAD PREP 24X41 OB/GYN DISP (PERSONAL CARE ITEMS) ×2 IMPLANT
SUT CHROMIC 3 0 SH 27 (SUTURE) ×4 IMPLANT
SUT SILK 0 CT 1 30 (SUTURE) ×2 IMPLANT
SUT VIC AB 3-0 SH 27 (SUTURE) ×2
SUT VIC AB 3-0 SH 27X BRD (SUTURE) ×1 IMPLANT
SYR CONTROL 10ML (SYRINGE) ×3 IMPLANT

## 2014-11-20 NOTE — Discharge Instructions (Addendum)
Ice to area for comfort. Stool softener daily. Tylenol/ Advil/ Aleve: If needed for soreness. Percocet (oxycodone): If needed for pain.                                                       AMBULATORY SURGERY  DISCHARGE INSTRUCTIONS   1) The drugs that you were given will stay in your system until tomorrow so for the next 24 hours you should not:  A) Drive an automobile B) Make any legal decisions C) Drink any alcoholic beverage   2) You may resume regular meals tomorrow.  Today it is better to start with liquids and gradually work up to solid foods.  You may eat anything you prefer, but it is better to start with liquids, then soup and crackers, and gradually work up to solid foods.   3) Please notify your doctor immediately if you have any unusual bleeding, trouble breathing, redness and pain at the surgery site, drainage, fever, or pain not relieved by medication.  4) Your post-operative visit with Dr.    George Ina                                 is: Date:                        Time:    Please call to schedule your post-operative visit.   5) Additional Instructions:

## 2014-11-20 NOTE — Anesthesia Preprocedure Evaluation (Signed)
Anesthesia Evaluation  Patient identified by MRN, date of birth, ID band Patient awake    Reviewed: Allergy & Precautions, H&P , NPO status , Patient's Chart, lab work & pertinent test results, reviewed documented beta blocker date and time   Airway Mallampati: II  TM Distance: >3 FB Neck ROM: full    Dental no notable dental hx.    Pulmonary neg pulmonary ROS,  breath sounds clear to auscultation  Pulmonary exam normal       Cardiovascular Exercise Tolerance: Good negative cardio ROS  Rhythm:regular Rate:Normal     Neuro/Psych negative neurological ROS  negative psych ROS   GI/Hepatic negative GI ROS, Neg liver ROS,   Endo/Other  negative endocrine ROS  Renal/GU negative Renal ROS  negative genitourinary   Musculoskeletal   Abdominal   Peds  Hematology negative hematology ROS (+)   Anesthesia Other Findings   Reproductive/Obstetrics negative OB ROS                             Anesthesia Physical Anesthesia Plan  ASA: II  Anesthesia Plan: General   Post-op Pain Management:    Induction:   Airway Management Planned:   Additional Equipment:   Intra-op Plan:   Post-operative Plan:   Informed Consent: I have reviewed the patients History and Physical, chart, labs and discussed the procedure including the risks, benefits and alternatives for the proposed anesthesia with the patient or authorized representative who has indicated his/her understanding and acceptance.   Dental Advisory Given  Plan Discussed with: CRNA  Anesthesia Plan Comments:         Anesthesia Quick Evaluation

## 2014-11-20 NOTE — Op Note (Signed)
Preoperative diagnosis: Hemorrhoids.  Postoperative diagnosis same colon.  Operative procedure: Hemorrhoidectomy.  Operative surgeon: Ollen Bowl, M.D.  Anesthesia: Gen. by LMA, 1% Xylocaine with 1-200,000 epinephrine: 30 mL local infiltration, Exparel: 20 mL local infiltration.  Estimated blood loss 20 mL.  Clinical note this 54 year old woman has been symptomatic with the large amount of redundant perianal skin and had evidence of a suspected polyp versus inflammatory hemorrhoid at 10:00 position. She underwent a colonoscopy this morning which was unremarkable except for hemorrhoids. She is brought to the operative for planned excision. She received Invanz prior the procedure.  Operative note with the patient under adequate general anesthesia she was placed in dorsal lithotomy position and the perineum prepped with Betadine solution and draped. Xylocaine followed by Exparel was infiltrated for postoperative analgesia. The dominant hemorrhoid complex the 10:00 position a 1.2 cm polypoid mass on the hemorrhoids. This was excised with the incision as the Ferguson technique. Hemostasis was with the Harmonic scalpel. The wound was closed with a running locking 3-0 chromic suture. Similar procedure was used at the 10 and 7:00 positions. No evidence of anal stenosis. A Vaseline gauze pack was placed in the anus for final hemostasis. The patient was taken to recovery in stable condition.

## 2014-11-20 NOTE — Progress Notes (Signed)
Rectal packing removed intact with small amount of blood to packing.

## 2014-11-20 NOTE — Anesthesia Postprocedure Evaluation (Signed)
  Anesthesia Post-op Note  Patient: Amber Henson  Procedure(s) Performed: Procedure(s): COLONOSCOPY (N/A)  Anesthesia type:General  Patient location: PACU  Post pain: Pain level controlled  Post assessment: Post-op Vital signs reviewed, Patient's Cardiovascular Status Stable, Respiratory Function Stable, Patent Airway and No signs of Nausea or vomiting  Post vital signs: Reviewed and stable  Last Vitals:  Filed Vitals:   11/20/14 1206  BP: 137/87  Pulse: 85  Temp: 36.5 C  Resp: 12    Level of consciousness: awake, alert  and patient cooperative  Complications: No apparent anesthesia complications

## 2014-11-20 NOTE — Progress Notes (Signed)
Pt with peri pad and mesh panties intact and dry from OR.

## 2014-11-20 NOTE — Anesthesia Preprocedure Evaluation (Addendum)
Anesthesia Evaluation  Patient identified by MRN, date of birth, ID band Patient awake    Reviewed: Allergy & Precautions, H&P , NPO status , Patient's Chart, lab work & pertinent test results, reviewed documented beta blocker date and time   Airway Mallampati: II  TM Distance: >3 FB Neck ROM: full    Dental   Pulmonary          Cardiovascular Rate:Normal     Neuro/Psych    GI/Hepatic   Endo/Other    Renal/GU      Musculoskeletal   Abdominal   Peds  Hematology   Anesthesia Other Findings   Reproductive/Obstetrics                             Anesthesia Physical Anesthesia Plan  ASA: II  Anesthesia Plan: General LMA   Post-op Pain Management:    Induction:   Airway Management Planned:   Additional Equipment:   Intra-op Plan:   Post-operative Plan:   Informed Consent: I have reviewed the patients History and Physical, chart, labs and discussed the procedure including the risks, benefits and alternatives for the proposed anesthesia with the patient or authorized representative who has indicated his/her understanding and acceptance.     Plan Discussed with: CRNA  Anesthesia Plan Comments:         Anesthesia Quick Evaluation

## 2014-11-20 NOTE — Transfer of Care (Signed)
Immediate Anesthesia Transfer of Care Note  Patient: Amber Henson  Procedure(s) Performed: Procedure(s): COLONOSCOPY (N/A)  Patient Location: PACU  Anesthesia Type:General  Level of Consciousness: sedated  Airway & Oxygen Therapy: Patient Spontanous Breathing and Patient connected to nasal cannula oxygen  Post-op Assessment: Report given to RN and Post -op Vital signs reviewed and stable  Post vital signs: Reviewed and stable  Last Vitals:  Filed Vitals:   11/20/14 0959  BP: 153/100  Pulse: 77  Temp: 36.7 C  Resp: 16    Complications: No apparent anesthesia complications

## 2014-11-20 NOTE — Anesthesia Postprocedure Evaluation (Signed)
  Anesthesia Post-op Note  Patient: Amber Henson  Procedure(s) Performed: Procedure(s): HEMORRHOIDECTOMY (N/A)  Anesthesia type:General LMA  Patient location: PACU  Post pain: Pain level controlled  Post assessment: Post-op Vital signs reviewed, Patient's Cardiovascular Status Stable, Respiratory Function Stable, Patent Airway and No signs of Nausea or vomiting  Post vital signs: Reviewed and stable  Last Vitals:  Filed Vitals:   11/20/14 1206  BP: 137/87  Pulse: 85  Temp: 36.5 C  Resp: 12    Level of consciousness: awake, alert  and patient cooperative  Complications: No apparent anesthesia complications

## 2014-11-20 NOTE — OR Nursing (Signed)
Surgeon visited and gave ins and prescription

## 2014-11-20 NOTE — H&P (Signed)
No change in health status or history. For colonoscopy and hemorrhoidectomy.

## 2014-11-20 NOTE — Op Note (Signed)
[facility] Patient Name: Amber Henson Procedure Date: 11/20/2014 10:22 AM MRN: [id number] Account #: [id number] Date of Birth: 1960/06/06 Admit Type: Outpatient Age: 54 Room: [facility] ENDO ROOM 1 Gender: Female Note Status: Finalized Procedure:         Colonoscopy Indications:       Screening for colorectal malignant neoplasm Providers:         Robert Bellow, MD Referring MD:      Kincius C. Copland Jaclyn Prime, MD (Referring MD) Medicines:         Monitored Anesthesia Care Complications:     No immediate complications. Procedure:         Pre-Anesthesia Assessment:                    - Prior to the procedure, a History and Physical was                     performed, and patient medications, allergies and                     sensitivities were reviewed. The patient's tolerance of                     previous anesthesia was reviewed.                    - The risks and benefits of the procedure and the sedation                     options and risks were discussed with the patient. All                     questions were answered and informed consent was obtained.                    After obtaining informed consent, the colonoscope was                     passed under direct vision. Throughout the procedure, the                     patient's blood pressure, pulse, and oxygen saturations                     were monitored continuously. The Colonoscope was                     introduced through the anus and advanced to the the cecum,                     identified by appendiceal orifice and ileocecal valve. The                     colonoscopy was performed without difficulty. The patient                     tolerated the procedure well. The quality of the bowel                     preparation was excellent. Findings:      A single medium-mouthed diverticulum was found in the sigmoid colon.      The retroflexed view of the distal rectum and anal verge was  normal and       showed no anal  or rectal abnormalities.      The perianal exam findings include non-thrombosed external hemorrhoids. Impression:        - Diverticulosis in the sigmoid colon.                    - The distal rectum and anal verge are normal on                     retroflexion view.                    - Non-thrombosed external hemorrhoids found on perianal                     exam.                    - No specimens collected. Recommendation:    - Repeat colonoscopy in 10 years for screening purposes. Diagnosis Code(s): --- Professional ---                    Z12.11, Encounter for screening for malignant neoplasm of                     colon                    K64.4, Residual hemorrhoidal skin tags                    K57.30, Diverticulosis of large intestine without                     perforation or abscess without bleeding Robert Bellow, MD 11/20/2014 10:50:07 AM This report has been signed electronically. Number of Addenda: 0 Note Initiated On: 11/20/2014 10:22 AM Scope Withdrawal Time: 0 hours 8 minutes 26 seconds  Total Procedure Duration: 0 hours 16 minutes 6 seconds       [facility]

## 2014-11-20 NOTE — Transfer of Care (Signed)
Immediate Anesthesia Transfer of Care Note  Patient: Amber Henson  Procedure(s) Performed: Procedure(s): HEMORRHOIDECTOMY (N/A)  Patient Location: PACU  Anesthesia Type:General  Level of Consciousness: awake and patient cooperative  Airway & Oxygen Therapy: Patient Spontanous Breathing and Patient connected to face mask oxygen  Post-op Assessment: Report given to RN and Post -op Vital signs reviewed and stable  Post vital signs: Reviewed and stable  Last Vitals:  Filed Vitals:   11/20/14 1206  BP: 137/87  Pulse: 85  Temp: 36.5 C  Resp: 12    Complications: No apparent anesthesia complications

## 2014-11-21 ENCOUNTER — Telehealth: Payer: Self-pay | Admitting: *Deleted

## 2014-11-21 LAB — SURGICAL PATHOLOGY

## 2014-11-21 NOTE — Telephone Encounter (Signed)
-----   Message from Robert Bellow, MD sent at 11/21/2014 11:35 AM EDT ----- The patient had a hemorrhoidectomy and colonoscopy yesterday. Please call and check and see how she's doing. Also, let her know that the pathology showed no changes except the hemorrhoids. No polyps. ----- Message -----    From: [facility]    Sent: 11/21/2014  11:26 AM      To: Robert Bellow, MD

## 2014-11-21 NOTE — Telephone Encounter (Signed)
Notified patient as instructed, patient very pleased.  States she is doing well only "sore". Discussed follow-up appointments, patient agrees

## 2014-11-26 ENCOUNTER — Telehealth: Payer: Self-pay | Admitting: *Deleted

## 2014-11-26 NOTE — Telephone Encounter (Signed)
Pt called asking if she could go back to work since having her hemorroid surgery. I spoke with Rosann Auerbach and then discussed with pt how she was feeling and if her job required any heavy lifting. She stated that she had no problems except for a small spot where hemorroid was removed, pink area and she is using ice. 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 She will keep her scheduled f/u appointment on the 11th of July with Dr. Bary Castilla.

## 2014-12-02 ENCOUNTER — Encounter: Payer: Self-pay | Admitting: General Surgery

## 2014-12-02 ENCOUNTER — Ambulatory Visit (INDEPENDENT_AMBULATORY_CARE_PROVIDER_SITE_OTHER): Payer: 59 | Admitting: General Surgery

## 2014-12-02 DIAGNOSIS — K649 Unspecified hemorrhoids: Secondary | ICD-10-CM

## 2014-12-02 NOTE — Progress Notes (Signed)
Patient ID: JOSANNA HEFEL, female   DOB: 02/07/1961, 54 y.o.   MRN: [id number]  Chief Complaint  Patient presents with  . Routine Post Op    hemorrhoidectomy and colonoscopy    HPI LIANA CAMERER is a 54 y.o. female here tyoday for her posyt op hemorrhoidectomy and colonoscopy done on 11/20/14. Patient states there is only a little bleeding at this time. The patient reports she had a good 2 days of pain relief with the Exparel injection. She has experienced no difficulty controlling her bowels. No episodes of incontinence. HPI  No past medical history on file.  Past Surgical History  Procedure Laterality Date  . Hemorrhoid surgery N/A 11/20/2014    Procedure: HEMORRHOIDECTOMY;  Surgeon: Robert Bellow, MD;  Location: [facility] ORS;  Service: General;  Laterality: N/A;  . Colonoscopy N/A 11/20/2014    Procedure: COLONOSCOPY;  Surgeon: Robert Bellow, MD;  Location: [facility] ENDOSCOPY;  Service: Endoscopy;  Laterality: N/A;    Family History  Problem Relation Age of Onset  . Diabetes Father   . Hypertension Father   . Hypertension Mother   . Colonic polyp Mother     Social History History  Substance Use Topics  . Smoking status: Never Smoker   . Smokeless tobacco: Never Used  . Alcohol Use: No    No Known Allergies  Current Outpatient Prescriptions  Medication Sig Dispense Refill  . Multiple Vitamin (MULTIVITAMIN) capsule Take 1 capsule by mouth daily.    . Omega-3 Fatty Acids (FISH OIL PO) Take 1 capsule by mouth daily.    Marland Kitchen oxyCODONE-acetaminophen (ROXICET) 5-325 MG per tablet Take 1-2 tablets by mouth every 4 (four) hours as needed for moderate pain or severe pain. 30 tablet 0  . oxyCODONE-acetaminophen (ROXICET) 5-325 MG per tablet Take 1-2 tablets by mouth every 4 (four) hours as needed for moderate pain or severe pain. 30 tablet 0   No current facility-administered medications for this visit.    Review of Systems Review of Systems  Constitutional: Negative.    Respiratory: Negative.   Cardiovascular: Negative.     Blood pressure 124/74, pulse 64, resp. rate 14, height 5\' 6"  (1.676 m), weight 170 lb (77.111 kg), last menstrual period 11/03/2014.  Physical Exam Physical Exam  Constitutional: She is oriented to person, place, and time. She appears well-developed and well-nourished.  Genitourinary:     Neurological: She is alert and oriented to person, place, and time.  Skin: Skin is warm and dry.    Data Reviewed Colonoscopy showed only scattered sigmoid diverticuli.  Pathology on the resected hemorrhoid showed no malignancy.  Assessment    Doing well status post hemorrhoidectomy and colonoscopy.    Plan        Patient to return in six weeks.  PCP: No Pcp  Ref Tamsen Meek 12/02/2014, 4:52 PM

## 2014-12-02 NOTE — Patient Instructions (Addendum)
Patient to return in six weeks.  

## 2015-01-15 ENCOUNTER — Encounter: Payer: Self-pay | Admitting: General Surgery

## 2015-01-15 ENCOUNTER — Ambulatory Visit (INDEPENDENT_AMBULATORY_CARE_PROVIDER_SITE_OTHER): Payer: 59 | Admitting: General Surgery

## 2015-01-15 VITALS — BP 108/68 | HR 66 | Resp 12 | Ht 65.0 in | Wt 169.0 lb

## 2015-01-15 DIAGNOSIS — K649 Unspecified hemorrhoids: Secondary | ICD-10-CM

## 2015-01-15 NOTE — Patient Instructions (Signed)
Continue fiber in your diet, she prefers fiber one bars. Follow up as needed. The patient is aware to call back for any questions or concerns.

## 2015-01-15 NOTE — Progress Notes (Signed)
Patient ID: Amber Henson, female   DOB: [birth year], 53 y.o.   MRN: [id number]  Chief Complaint  Patient presents with  . Follow-up    HPI Amber Henson is a 54 y.o. female.  Here today for follow up hemorrhoidectomy done 11-20-14. She states she is doing well. No bleeding with BM. Bowels move every other day.   HPI  No past medical history on file.  Past Surgical History  Procedure Laterality Date  . Hemorrhoid surgery N/A 11/20/2014    Procedure: HEMORRHOIDECTOMY;  Surgeon: Robert Bellow, MD;  Location: [facility] ORS;  Service: General;  Laterality: N/A;  . Colonoscopy N/A 11/20/2014    Procedure: COLONOSCOPY;  Surgeon: Robert Bellow, MD;  Location: [facility] ENDOSCOPY;  Service: Endoscopy;  Laterality: N/A;    Family History  Problem Relation Age of Onset  . Diabetes Father   . Hypertension Father   . Hypertension Mother   . Colonic polyp Mother     Social History Social History  Substance Use Topics  . Smoking status: Never Smoker   . Smokeless tobacco: Never Used  . Alcohol Use: No    No Known Allergies  Current Outpatient Prescriptions  Medication Sig Dispense Refill  . Multiple Vitamin (MULTIVITAMIN) capsule Take 1 capsule by mouth daily.    . Omega-3 Fatty Acids (FISH OIL PO) Take 1 capsule by mouth daily.     No current facility-administered medications for this visit.    Review of Systems Review of Systems  Blood pressure 108/68, pulse 66, resp. rate 12, height 5\' 5"  (1.651 m), weight 169 lb (76.658 kg), last menstrual period 12/18/2014.  Physical Exam Physical Exam  Constitutional: She is oriented to person, place, and time. She appears well-developed and well-nourished.  HENT:  Mouth/Throat: Oropharynx is clear and moist.  Eyes: Conjunctivae are normal. No scleral icterus.  Genitourinary:     Small area of healing posteriorly.  Neurological: She is alert and oriented to person, place, and time.  Skin: Skin is warm and dry.  Psychiatric: Her  behavior is normal.    Data Reviewed Pathology showed hemorrhoids.  Assessment    Doing well status post hemorrhoidectomy.    Plan    The small area of residual skin should continue to shrink. Care with perianal hygiene emphasized.    Continue fiber in your diet, she prefers fiber one bars. Follow up as needed. The patient is aware to call back for any questions or concerns.    PCP:  No Pcp  Ref: Copland, Tawanna Cooler 01/17/2015, 11:11 AM

## 2015-09-22 DIAGNOSIS — B029 Zoster without complications: Secondary | ICD-10-CM

## 2015-09-22 HISTORY — DX: Zoster without complications: B02.9

## 2015-10-06 ENCOUNTER — Other Ambulatory Visit: Payer: Self-pay | Admitting: Obstetrics and Gynecology

## 2015-10-06 DIAGNOSIS — Z1231 Encounter for screening mammogram for malignant neoplasm of breast: Secondary | ICD-10-CM

## 2015-11-05 ENCOUNTER — Other Ambulatory Visit: Payer: Self-pay | Admitting: Obstetrics and Gynecology

## 2015-11-05 ENCOUNTER — Ambulatory Visit
Admission: RE | Admit: 2015-11-05 | Discharge: 2015-11-05 | Disposition: A | Discharge status: Home / Self Care | Payer: 59 | Source: Ambulatory Visit | Attending: Obstetrics and Gynecology | Admitting: Obstetrics and Gynecology

## 2015-11-05 DIAGNOSIS — Z1231 Encounter for screening mammogram for malignant neoplasm of breast: Secondary | ICD-10-CM

## 2015-11-05 LAB — HM MAMMOGRAPHY: HM MAMMO: ABNORMAL — AB (ref 0–4)

## 2015-11-13 ENCOUNTER — Other Ambulatory Visit: Payer: Self-pay | Admitting: Obstetrics and Gynecology

## 2015-11-13 DIAGNOSIS — N6459 Other signs and symptoms in breast: Secondary | ICD-10-CM

## 2015-11-13 DIAGNOSIS — N6310 Unspecified lump in the right breast, unspecified quadrant: Secondary | ICD-10-CM

## 2015-11-20 ENCOUNTER — Ambulatory Visit
Admission: RE | Admit: 2015-11-20 | Discharge: 2015-11-20 | Disposition: A | Discharge status: Home / Self Care | Payer: 59 | Source: Ambulatory Visit | Attending: Obstetrics and Gynecology | Admitting: Obstetrics and Gynecology

## 2015-11-20 DIAGNOSIS — N63 Unspecified lump in breast: Secondary | ICD-10-CM | POA: Diagnosis not present

## 2015-11-20 DIAGNOSIS — N6310 Unspecified lump in the right breast, unspecified quadrant: Secondary | ICD-10-CM

## 2015-11-20 DIAGNOSIS — N6459 Other signs and symptoms in breast: Secondary | ICD-10-CM

## 2015-12-01 ENCOUNTER — Other Ambulatory Visit: Payer: Self-pay | Admitting: Obstetrics and Gynecology

## 2015-12-01 DIAGNOSIS — N6009 Solitary cyst of unspecified breast: Secondary | ICD-10-CM

## 2015-12-24 ENCOUNTER — Ambulatory Visit: Payer: 59 | Admitting: Family Medicine

## 2015-12-30 ENCOUNTER — Ambulatory Visit: Payer: 59 | Admitting: Family Medicine

## 2016-01-05 ENCOUNTER — Encounter: Payer: Self-pay | Admitting: Family Medicine

## 2016-01-05 ENCOUNTER — Ambulatory Visit (INDEPENDENT_AMBULATORY_CARE_PROVIDER_SITE_OTHER): Payer: 59 | Admitting: Family Medicine

## 2016-01-05 VITALS — BP 116/78 | HR 71 | Ht 65.25 in | Wt 167.2 lb

## 2016-01-05 DIAGNOSIS — I10 Essential (primary) hypertension: Secondary | ICD-10-CM

## 2016-01-05 DIAGNOSIS — Z8041 Family history of malignant neoplasm of ovary: Secondary | ICD-10-CM

## 2016-01-05 DIAGNOSIS — N6012 Diffuse cystic mastopathy of left breast: Secondary | ICD-10-CM

## 2016-01-05 DIAGNOSIS — E663 Overweight: Secondary | ICD-10-CM | POA: Diagnosis not present

## 2016-01-05 DIAGNOSIS — Z8619 Personal history of other infectious and parasitic diseases: Secondary | ICD-10-CM | POA: Insufficient documentation

## 2016-01-05 DIAGNOSIS — N6011 Diffuse cystic mastopathy of right breast: Secondary | ICD-10-CM

## 2016-01-05 DIAGNOSIS — Z833 Family history of diabetes mellitus: Secondary | ICD-10-CM

## 2016-01-05 DIAGNOSIS — Z8249 Family history of ischemic heart disease and other diseases of the circulatory system: Secondary | ICD-10-CM | POA: Diagnosis not present

## 2016-01-05 DIAGNOSIS — N63 Unspecified lump in breast: Secondary | ICD-10-CM

## 2016-01-05 DIAGNOSIS — N6310 Unspecified lump in the right breast, unspecified quadrant: Secondary | ICD-10-CM

## 2016-01-05 NOTE — Progress Notes (Signed)
New patient office visit note:  Impression and Recommendations:    1. Essential hypertension   2. Family history of early CAD   3. Overweight (BMI 25.0-29.9)   4. History of shingles   5. Mass of right breast on mammogram   6. Diffuse cystic mastopathy of both breasts (R>L)   7. Family history of diabetes mellitus-father   8. Family history of ovarian cancer- sister age 53   9. Family history of essential hypertension- in 50s and early 37s     Mass of right breast on mammogram Patient's last mammogram was June 2014 - was abnormal. In June 2017 she had a ultrasound of her right breast to further assess persistent mass in the medial aspect of her right breast measuring approximately 1 cm.     The mass identified on the screening mammogram was thought to correspond to benign mass sonographically likely representing cluster of cysts.   Patient needs:  Six-month follow-up right breast ultrasound- repeat December 2017  Hypertension Blood pressure well controlled currently.  Goal blood pressures discussed with patient.   Continue losartan; try to monitor at home.  Advised patient she needs labs in near future.  Dietary and lifestyle modifications discussed. Low salt diet. Advised increase activity level to 30 minutes 5 days a week or more.  Handouts on DASH diet provided.  Overweight (BMI 25.0-29.9) Normal BMI as it relates to decreasing risk for chronic disease such as - hypertension, diabetes, hyperlipidemia, premature osteoarthritis of joints etc-  discussed with patient.    American Heart Association guidelines for healthy diet, basically Mediterranean diet, and exercise guidelines of 30 minutes 5 days per week or more discussed in detail.    Handouts provided re: Mediterranean diet.  Health counseling performed.  All questions answered.  Pt was in the office today for 40+ minutes, with over 50% time spent in face to face counseling of various medical concerns and  in coordination of care  Patient's Medications  New Prescriptions   No medications on file  Previous Medications   ENSTILAR 0.005-0.064 % FOAM    Apply 1 application topically as needed.   LOSARTAN (COZAAR) 25 MG TABLET    Take 1 tablet by mouth daily.   MULTIPLE VITAMIN (MULTIVITAMIN) CAPSULE    Take 1 capsule by mouth daily.   OMEGA-3 FATTY ACIDS (FISH OIL PO)    Take 1 capsule by mouth daily.   OVER THE COUNTER MEDICATION    Bone nutrient  Modified Medications   No medications on file  Discontinued Medications   No medications on file    Return in about 2 months (around 03/06/2016) for Fasting blood work at same time as wellness exam & health maintenance evaluation.  The patient was counseled, risk factors were discussed, anticipatory guidance given.  Gross side effects, risk and benefits, and alternatives of medications discussed with patient.  Patient is aware that all medications have potential side effects and we are unable to predict every side effect or drug-drug interaction that may occur.  Expresses verbal understanding and consents to current therapy plan and treatment regimen.  Please see AVS handed out to patient at the end of our visit for further patient instructions/ counseling done pertaining to today's office visit.    Note: This document was prepared using Dragon voice recognition software and may include unintentional dictation errors.  ----------------------------------------------------------------------------------------------------------------------    Subjective:    Chief Complaint  Patient presents with  . Establish Care  HPI: LEATH VANALSTYNE is a pleasant 55 y.o. female who presents to [place] at [facility] today to review their medical history with me and establish care.   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   She's been widowed for 3 years now. He died of cirrhosis of the liver.  He was an alcoholic.   2 children ages 75, 64.  Patient doesn't smoke and never has, no alcohol, no recreational drugs. Not sexually active.   She is satisfied with her weight and rates her diet as good. She does very little exercising but plans to start a dance class for 1 hour , 2 days /week   Goes to [employer] in [place], Fraser Din M.D.  Psoriasis- Derm in [facility]  GI - Dr Richardson Landry- [place]   Hypertension: Started on bp meds 2 mo ago. Her blood pressures at that time were right around 150/100.   Intol to lisopril due to cough.    Was told HTN at 64mo ago OV with GYN Doc as well and was told to get a family physician. Patient has never had a primary care physician prior  Patient's last mammogram was June 2014 - was abnormal. In June 2017 she had a ultrasound of her right breast to persistent mass in the medial aspect of her right breast measuring approximately 1 cm.   The mass identified on the screening mammogram was thought to correspond to benign mass sonographically likely representing cluster of cysts. Patient needs:  Six-month follow-up right breast ultrasound.  Had Shingles breakout in recent past- mild, went on meds and was gone w/in 10d..    Wt Readings from Last 3 Encounters:  01/05/16 167 lb 3.2 oz (75.8 kg)  01/15/15 169 lb (76.7 kg)  12/02/14 170 lb (77.1 kg)   BP Readings from Last 3 Encounters:  01/05/16 116/78  01/15/15 108/68  12/02/14 124/74   Pulse Readings from Last 3 Encounters:  01/05/16 71  01/15/15 66  12/02/14 64     Patient Active Problem List   Diagnosis Date Noted  . Hypertension 01/10/2016  . Mass of right breast on mammogram 01/10/2016  . Diffuse cystic mastopathy of both breasts (R>L) 01/10/2016  . Family history of diabetes mellitus-father 01/10/2016  . Family history of ovarian cancer- sister age 67 01/10/2016  . Family history of essential hypertension- in 22s and early 50s 01/10/2016  . Family  history of early CAD 01/05/2016  . Overweight (BMI 25.0-29.9) 01/05/2016  . History of shingles 01/05/2016  . h/o Hemorrhoid- sx repair 6/16 11/01/2014     Past Medical History:  Diagnosis Date  . Hypertension      Past Surgical History:  Procedure Laterality Date  . BREAST BIOPSY Left 10/2014   negative  . COLONOSCOPY N/A 11/20/2014   Procedure: COLONOSCOPY;  Surgeon: Robert Bellow, MD;  Location: [facility] ENDOSCOPY;  Service: Endoscopy;  Laterality: N/A;  . HEMORRHOID SURGERY N/A 11/20/2014   Procedure: HEMORRHOIDECTOMY;  Surgeon: Robert Bellow, MD;  Location: [facility] ORS;  Service: General;  Laterality: N/A;     Family History  Problem Relation Age of Onset  . Hypertension Mother   . Colonic polyp Mother   . Diabetes Father   . Hypertension Father   . Heart attack Father   . Cancer Sister     ovarian  . Heart attack Brother   . Hypertension Brother      History  Drug Use No    History  Alcohol Use No    History  Smoking Status  . Never Smoker  Smokeless Tobacco  . Never Used    Patient's Medications  New Prescriptions   No medications on file  Previous Medications   ENSTILAR 0.005-0.064 % FOAM    Apply 1 application topically as needed.   LOSARTAN (COZAAR) 25 MG TABLET    Take 1 tablet by mouth daily.   MULTIPLE VITAMIN (MULTIVITAMIN) CAPSULE    Take 1 capsule by mouth daily.   OMEGA-3 FATTY ACIDS (FISH OIL PO)    Take 1 capsule by mouth daily.   OVER THE COUNTER MEDICATION    Bone nutrient  Modified Medications   No medications on file  Discontinued Medications   No medications on file    Allergies: Review of patient's allergies indicates no known allergies.  Review of Systems:   ( Completed via Adult Medical History Intake form today ) General:  Denies fever, chills, appetite changes, unexplained weight loss.  Optho/Auditory:   Denies visual changes, blurred vision/LOV, ringing in ears/ diff hearing Respiratory:   Denies SOB, DOE, cough,  wheezing.  Cardiovascular:   Denies chest pain, palpitations, new onset peripheral edema  Gastrointestinal:   Denies nausea, vomiting, diarrhea.  Genitourinary:    Denies dysuria, increased frequency, flank pain.  Endocrine:     Denies hot or cold intolerance, polyuria, polydipsia. Musculoskeletal:  Denies unexplained myalgias, joint swelling, arthralgias, gait problems.  Skin:  Denies rash, suspicious lesions or new/ changes in moles Neurological:    Denies dizziness, syncope, unexplained weakness, lightheadedness, numbness  Psychiatric/Behavioral:   Denies mood changes, suicidal or homicidal ideations, hallucinations    Objective:   Blood pressure 116/78, pulse 71, height 5' 5.25" (1.657 m), weight 167 lb 3.2 oz (75.8 kg), last menstrual period 12/27/2015. Body mass index is 27.61 kg/m.   General: Well Developed, well nourished, and in no acute distress.  Neuro: Alert and oriented x3, extra-ocular muscles intact, sensation grossly intact.  HEENT: Normocephalic, atraumatic, pupils equal round reactive to light, neck supple, no gross masses, no carotid bruits, no JVD apprec Skin: no gross suspicious lesions or rashes  Cardiac: Regular rate and rhythm, no murmurs rubs or gallops.  Respiratory: Essentially clear to auscultation bilaterally. Not using accessory muscles, speaking in full sentences.  Abdominal: Soft, not grossly distended Musculoskeletal: Ambulates w/o diff, FROM * 4 ext.  Vasc: less 2 sec cap RF, warm and pink  Psych:  No HI/SI, judgement and insight good.

## 2016-01-05 NOTE — Patient Instructions (Signed)
DASH Eating Plan DASH stands for "Dietary Approaches to Stop Hypertension." The DASH eating plan is a healthy eating plan that has been shown to reduce high blood pressure (hypertension). Additional health benefits may include reducing the risk of type 2 diabetes mellitus, heart disease, and stroke. The DASH eating plan may also help with weight loss. WHAT DO I NEED TO KNOW ABOUT THE DASH EATING PLAN? For the DASH eating plan, you will follow these general guidelines:  Choose foods with a percent daily value for sodium of less than 5% (as listed on the food label).  Use salt-free seasonings or herbs instead of table salt or sea salt.  Check with your health care provider or pharmacist before using salt substitutes.  Eat lower-sodium products, often labeled as "lower sodium" or "no salt added."  Eat fresh foods.  Eat more vegetables, fruits, and low-fat dairy products.  Choose whole grains. Look for the word "whole" as the first word in the ingredient list.  Choose fish and skinless chicken or [place] more often than red meat. Limit fish, poultry, and meat to 6 oz (170 g) each day.  Limit sweets, desserts, sugars, and sugary drinks.  Choose heart-healthy fats.  Limit cheese to 1 oz (28 g) per day.  Eat more home-cooked food and less restaurant, buffet, and fast food.  Limit fried foods.  Cook foods using methods other than frying.  Limit canned vegetables. If you do use them, rinse them well to decrease the sodium.  When eating at a restaurant, ask that your food be prepared with less salt, or no salt if possible. WHAT FOODS CAN I EAT? Seek help from a dietitian for individual calorie needs. Grains Whole grain or whole wheat bread. Brown rice. Whole grain or whole wheat pasta. Quinoa, bulgur, and whole grain cereals. Low-sodium cereals. Corn or whole wheat flour tortillas. Whole grain cornbread. Whole grain crackers. Low-sodium crackers. Vegetables Fresh or frozen vegetables  (raw, steamed, roasted, or grilled). Low-sodium or reduced-sodium tomato and vegetable juices. Low-sodium or reduced-sodium tomato sauce and paste. Low-sodium or reduced-sodium canned vegetables.  Fruits All fresh, canned (in natural juice), or frozen fruits. Meat and Other Protein Products Ground beef (85% or leaner), grass-fed beef, or beef trimmed of fat. Skinless chicken or [place]. Ground chicken or [place]. Pork trimmed of fat. All fish and seafood. Eggs. Dried beans, peas, or lentils. Unsalted nuts and seeds. Unsalted canned beans. Dairy Low-fat dairy products, such as skim or 1% milk, 2% or reduced-fat cheeses, low-fat ricotta or cottage cheese, or plain low-fat yogurt. Low-sodium or reduced-sodium cheeses. Fats and Oils Tub margarines without trans fats. Light or reduced-fat mayonnaise and salad dressings (reduced sodium). Avocado. Safflower, olive, or canola oils. Natural peanut or almond butter. Other Unsalted popcorn and pretzels. The items listed above may not be a complete list of recommended foods or beverages. Contact your dietitian for more options. WHAT FOODS ARE NOT RECOMMENDED? Grains White bread. White pasta. White rice. Refined cornbread. Bagels and croissants. Crackers that contain trans fat. Vegetables Creamed or fried vegetables. Vegetables in a cheese sauce. Regular canned vegetables. Regular canned tomato sauce and paste. Regular tomato and vegetable juices. Fruits Dried fruits. Canned fruit in light or heavy syrup. Fruit juice. Meat and Other Protein Products Fatty cuts of meat. Ribs, chicken wings, bacon, sausage, bologna, salami, chitterlings, fatback, hot dogs, bratwurst, and packaged luncheon meats. Salted nuts and seeds. Canned beans with salt. Dairy Whole or 2% milk, cream, half-and-half, and cream cheese. Whole-fat or sweetened yogurt. Full-fat  cheeses or blue cheese. Nondairy creamers and whipped toppings. Processed cheese, cheese spreads, or cheese  curds. Condiments Onion and garlic salt, seasoned salt, table salt, and sea salt. Canned and packaged gravies. Worcestershire sauce. Tartar sauce. Barbecue sauce. Teriyaki sauce. Soy sauce, including reduced sodium. Steak sauce. Fish sauce. Oyster sauce. Cocktail sauce. Horseradish. Ketchup and mustard. Meat flavorings and tenderizers. Bouillon cubes. Hot sauce. Tabasco sauce. Marinades. Taco seasonings. Relishes. Fats and Oils Butter, stick margarine, lard, shortening, ghee, and bacon fat. Coconut, palm kernel, or palm oils. Regular salad dressings. Other Pickles and olives. Salted popcorn and pretzels. The items listed above may not be a complete list of foods and beverages to avoid. Contact your dietitian for more information. WHERE CAN I FIND MORE INFORMATION? National Heart, Lung, and Blood Institute: travelstabloid.com   This information is not intended to replace advice given to you by your health care provider. Make sure you discuss any questions you have with your health care provider.   Document Released: 04/29/2011 Document Revised: 05/31/2014 Document Reviewed: 03/14/2013 Elsevier Interactive Patient Education 2016 [place] Diet  Why follow it? Research shows. . Those who follow the Mediterranean diet have a reduced risk of heart disease  . The diet is associated with a reduced incidence of Parkinson's and Alzheimer's diseases . People following the diet may have longer life expectancies and lower rates of chronic diseases  . The Dietary Guidelines for Americans recommends the Mediterranean diet as an eating plan to promote health and prevent disease  What Is the Mediterranean Diet?  . Healthy eating plan based on typical foods and recipes of Mediterranean-style cooking . The diet is primarily a plant based diet; these foods should make up a majority of meals   Starches - Plant based foods should make up a  majority of meals - They are an important sources of vitamins, minerals, energy, antioxidants, and fiber - Choose whole grains, foods high in fiber and minimally processed items  - Typical grain sources include wheat, oats, barley, corn, brown rice, bulgar, farro, millet, polenta, couscous  - Various types of beans include chickpeas, lentils, fava beans, black beans, white beans   Fruits  Veggies - Large quantities of antioxidant rich fruits & veggies; 6 or more servings  - Vegetables can be eaten raw or lightly drizzled with oil and cooked  - Vegetables common to the traditional Mediterranean Diet include: artichokes, arugula, beets, broccoli, brussel sprouts, cabbage, carrots, celery, collard greens, cucumbers, eggplant, kale, leeks, lemons, lettuce, mushrooms, okra, onions, peas, peppers, potatoes, pumpkin, radishes, rutabaga, shallots, spinach, sweet potatoes, turnips, zucchini - Fruits common to the Mediterranean Diet include: apples, apricots, avocados, cherries, clementines, dates, figs, grapefruits, grapes, melons, nectarines, oranges, peaches, pears, pomegranates, strawberries, tangerines  Fats - Replace butter and margarine with healthy oils, such as olive oil, canola oil, and tahini  - Limit nuts to no more than a handful a day  - Nuts include walnuts, almonds, pecans, pistachios, pine nuts  - Limit or avoid candied, honey roasted or heavily salted nuts - Olives are central to the [employer] - can be eaten whole or used in a variety of dishes   Meats Protein - Limiting red meat: no more than a few times a month - When eating red meat: choose lean cuts and keep the portion to the size of deck of cards - Eggs: approx. 0 to 4 times a week  - Fish and lean  poultry: at least 2 a week  - Healthy protein sources include, chicken, [place], lean beef, lamb - Increase intake of seafood such as tuna, salmon, trout, mackerel, shrimp, scallops - Avoid or limit high fat processed meats such  as sausage and bacon  Dairy - Include moderate amounts of low fat dairy products  - Focus on healthy dairy such as fat free yogurt, skim milk, low or reduced fat cheese - Limit dairy products higher in fat such as whole or 2% milk, cheese, ice cream  Alcohol - Moderate amounts of red wine is ok  - No more than 5 oz daily for women (all ages) and men older than age 25  - No more than 10 oz of wine daily for men younger than 80  Other - Limit sweets and other desserts  - Use herbs and spices instead of salt to flavor foods  - Herbs and spices common to the traditional Mediterranean Diet include: basil, bay leaves, chives, cloves, cumin, fennel, garlic, lavender, marjoram, mint, oregano, parsley, pepper, rosemary, sage, savory, sumac, tarragon, thyme   It's not just a diet, it's a lifestyle:  . The Mediterranean diet includes lifestyle factors typical of those in the region  . Foods, drinks and meals are best eaten with others and savored . Daily physical activity is important for overall good health . This could be strenuous exercise like running and aerobics . This could also be more leisurely activities such as walking, housework, yard-work, or taking the stairs . Moderation is the key; a balanced and healthy diet accommodates most foods and drinks . Consider portion sizes and frequency of consumption of certain foods   Meal Ideas & Options:  . Breakfast:  o Whole wheat toast or whole wheat English muffins with peanut butter & hard boiled egg o Steel cut oats topped with apples & cinnamon and skim milk  o Fresh fruit: banana, strawberries, melon, berries, peaches  o Smoothies: strawberries, bananas, greek yogurt, peanut butter o Low fat greek yogurt with blueberries and granola  o Egg white omelet with spinach and mushrooms o Breakfast couscous: whole wheat couscous, apricots, skim milk, cranberries  . Sandwiches:  o Hummus and grilled vegetables (peppers, zucchini, squash) on whole  wheat bread   o Grilled chicken on whole wheat pita with lettuce, tomatoes, cucumbers or tzatziki  o Tuna salad on whole wheat bread: tuna salad made with greek yogurt, olives, red peppers, capers, green onions o Garlic rosemary lamb pita: lamb sauted with garlic, rosemary, salt & pepper; add lettuce, cucumber, greek yogurt to pita - flavor with lemon juice and black pepper  . Seafood:  o Mediterranean grilled salmon, seasoned with garlic, basil, parsley, lemon juice and black pepper o Shrimp, lemon, and spinach whole-grain pasta salad made with low fat greek yogurt  o Seared scallops with lemon orzo  o Seared tuna steaks seasoned salt, pepper, coriander topped with tomato mixture of olives, tomatoes, olive oil, minced garlic, parsley, green onions and cappers  . Meats:  o Herbed greek chicken salad with kalamata olives, cucumber, feta  o Red bell peppers stuffed with spinach, bulgur, lean ground beef (or lentils) & topped with feta   o Kebabs: skewers of chicken, tomatoes, onions, zucchini, squash  o [place] burgers: made with red onions, mint, dill, lemon juice, feta cheese topped with roasted red peppers . Vegetarian o Cucumber salad: cucumbers, artichoke hearts, celery, red onion, feta cheese, tossed in olive oil & lemon juice  o Hummus and whole grain  pita points with a greek salad (lettuce, tomato, feta, olives, cucumbers, red onion) o Lentil soup with celery, carrots made with vegetable broth, garlic, salt and pepper  o Tabouli salad: parsley, bulgur, mint, scallions, cucumbers, tomato, radishes, lemon juice, olive oil, salt and pepper.

## 2016-01-10 DIAGNOSIS — Z8249 Family history of ischemic heart disease and other diseases of the circulatory system: Secondary | ICD-10-CM | POA: Insufficient documentation

## 2016-01-10 DIAGNOSIS — N6012 Diffuse cystic mastopathy of left breast: Secondary | ICD-10-CM

## 2016-01-10 DIAGNOSIS — I10 Essential (primary) hypertension: Secondary | ICD-10-CM | POA: Insufficient documentation

## 2016-01-10 DIAGNOSIS — N6011 Diffuse cystic mastopathy of right breast: Secondary | ICD-10-CM | POA: Insufficient documentation

## 2016-01-10 DIAGNOSIS — Z8041 Family history of malignant neoplasm of ovary: Secondary | ICD-10-CM | POA: Insufficient documentation

## 2016-01-10 DIAGNOSIS — N6310 Unspecified lump in the right breast, unspecified quadrant: Secondary | ICD-10-CM | POA: Insufficient documentation

## 2016-01-10 DIAGNOSIS — Z833 Family history of diabetes mellitus: Secondary | ICD-10-CM | POA: Insufficient documentation

## 2016-01-10 NOTE — Assessment & Plan Note (Addendum)
Patient's last mammogram was June 2014 - was abnormal. In June 2017 she had a ultrasound of her right breast to further assess persistent mass in the medial aspect of her right breast measuring approximately 1 cm.     The mass identified on the screening mammogram was thought to correspond to benign mass sonographically likely representing cluster of cysts.   Patient needs:  Six-month follow-up right breast ultrasound- repeat December 2017

## 2016-01-10 NOTE — Assessment & Plan Note (Addendum)
Normal BMI as it relates to decreasing risk for chronic disease such as - hypertension, diabetes, hyperlipidemia, premature osteoarthritis of joints etc-  discussed with patient.    American Heart Association guidelines for healthy diet, basically Mediterranean diet, and exercise guidelines of 30 minutes 5 days per week or more discussed in detail.    Handouts provided re: Mediterranean diet.  Health counseling performed.  All questions answered.

## 2016-01-10 NOTE — Assessment & Plan Note (Addendum)
Blood pressure well controlled currently.  Goal blood pressures discussed with patient.   Continue losartan; try to monitor at home.  Advised patient she needs labs in near future.  Dietary and lifestyle modifications discussed. Low salt diet. Advised increase activity level to 30 minutes 5 days a week or more.  Handouts on DASH diet provided.

## 2016-01-14 LAB — LIPID PANEL
Cholesterol: 163 mg/dL (ref 0–200)
HDL: 50 mg/dL (ref 35–70)
LDL CALC: 80 mg/dL
TRIGLYCERIDES: 164 mg/dL — AB (ref 40–160)

## 2016-01-14 LAB — BASIC METABOLIC PANEL
CREATININE: 0.8 mg/dL (ref <=1.1)
GLUCOSE: 80 mg/dL

## 2016-01-14 LAB — HEMOGLOBIN A1C: Hemoglobin A1C: 4.9

## 2016-03-12 ENCOUNTER — Ambulatory Visit (INDEPENDENT_AMBULATORY_CARE_PROVIDER_SITE_OTHER): Payer: 59 | Admitting: Family Medicine

## 2016-03-12 ENCOUNTER — Other Ambulatory Visit: Payer: Self-pay

## 2016-03-12 ENCOUNTER — Encounter: Payer: Self-pay | Admitting: Family Medicine

## 2016-03-12 VITALS — BP 117/78 | HR 70 | Ht 65.25 in | Wt 172.0 lb

## 2016-03-12 DIAGNOSIS — E559 Vitamin D deficiency, unspecified: Secondary | ICD-10-CM | POA: Diagnosis not present

## 2016-03-12 DIAGNOSIS — E781 Pure hyperglyceridemia: Secondary | ICD-10-CM | POA: Diagnosis not present

## 2016-03-12 DIAGNOSIS — I10 Essential (primary) hypertension: Secondary | ICD-10-CM

## 2016-03-12 DIAGNOSIS — E663 Overweight: Secondary | ICD-10-CM | POA: Diagnosis not present

## 2016-03-12 DIAGNOSIS — Z0000 Encounter for general adult medical examination without abnormal findings: Secondary | ICD-10-CM

## 2016-03-12 NOTE — Progress Notes (Signed)
Impression and Recommendations:    1. Overweight (BMI 25.0-29.9)   2. Routine physical examination   3. Vitamin D deficiency   4. Hypertriglyceridemia   5. Essential hypertension    Vitamin D deficiency Pt's Vitamin D level is too low.  I recommend pt take a once weekly dose of 50 K IUs.  Please e-prescribe this and dispense #12 tablets with 10 refills.   Inform patient this will likely be a lifelong thing, and we will monitor it yearly.   In addition to the once weekly prescription dose, pt needs to take an OTC daily dose of 5,000 IUs of vitamin D3.   Hypertriglyceridemia Dietary changes such as low saturated & trans fat and low carb/ ketogenic diets discussed with patient.  Encouraged regular exercise and weight loss when appropriate.   Educational handouts provided at patient's desire.  Contact us prior with any Q's/ concerns.   Please see orders section below for further details of actions taken during this office visit.  Gross side effects, risk and benefits, and alternatives of medications discussed with patient.  Patient is aware that all medications have potential side effects and we are unable to predict every side effect or drug-drug interaction that may occur.  Expresses verbal understanding and consents to current therapy plan and treatment regiment.  1) Anticipatory Guidance: Discussed importance of wearing a seatbelt while driving, not texting while driving; sunscreen when outside along with yearly skin surveillance; eating a well balanced and modest diet; physical activity at least 25 minutes per day or 150 min/ week of moderate to intense activity.  2) Immunizations / Screenings / Labs:  All immunizations and screenings that patient agrees to, are up-to-date per recommendations or will be updated today.  Patient understands the needs for q 50modental and yearly vision screens which pt will schedule independently. Obtain CBC, CMP, HgA1c, Lipid panel, TSH and vit D  when fasting if not already done recently.   3) Weight:   Discussed goal of losing even 5-10% of current body weight which would improve overall feelings of well being and improve objective health data significantly.   Improve nutrient density of diet through increasing intake of fruits and vegetables and decreasing saturated/trans fats, white flour products and refined sugar products.   F-up preventative CPE in 1 year. F/up sooner for chronic care management as discussed and/or prn.  Please see orders placed and AVS handed out to patient at the end of our visit for further patient instructions/ counseling done pertaining to today's office visit.     Subjective:    Chief Complaint  Patient presents with  . Annual Exam   HPI: Amber HORSEYis a 55y.o. female [facility].  Health Maintenance Summary Reviewed and updated, unless pt declines services.  Alcohol:      No concerns, no excessive use Exercise Habits:    Not yet STD concerns:     none Drug Use:     None Birth control method:    none Menses regular:      Yes- WNL's Lumps or breast concerns:      no Breast Cancer Family History:      Sister w ovarian CA- 424yo GYN exam- 6/17-- WNL's.  Also does rectal exam. Skin exam - 6/17 Eyes- feb 2017- yrly Teeth- q 6 mo   Wt Readings from Last 3 Encounters:  03/12/16 172 lb (78  kg)  01/05/16 167 lb 3.2 oz (75.8 kg)  01/15/15 169 lb (76.7 kg)   BP Readings from Last 3 Encounters:  03/12/16 117/78  01/05/16 116/78  01/15/15 108/68   Pulse Readings from Last 3 Encounters:  03/12/16 70  01/05/16 71  01/15/15 66     Past Medical History:  Diagnosis Date  . Hypertension       Past Surgical History:  Procedure Laterality Date  . BREAST BIOPSY Left 10/2014   negative  . COLONOSCOPY N/A 11/20/2014   Procedure: COLONOSCOPY;  Surgeon: Robert Bellow, MD;  Location: [facility] ENDOSCOPY;   Service: Endoscopy;  Laterality: N/A;  . HEMORRHOID SURGERY N/A 11/20/2014   Procedure: HEMORRHOIDECTOMY;  Surgeon: Robert Bellow, MD;  Location: [facility] ORS;  Service: General;  Laterality: N/A;      Family History  Problem Relation Age of Onset  . Hypertension Mother   . Colonic polyp Mother   . Diabetes Father   . Hypertension Father   . Heart attack Father   . Cancer Sister     ovarian  . Heart attack Brother   . Hypertension Brother       History  Drug Use No  ,   History  Alcohol Use No  ,   History  Smoking Status  . Never Smoker  Smokeless Tobacco  . Never Used  ,   History  Sexual Activity  . Sexual activity: Not Currently    Current Outpatient Prescriptions on File Prior to Visit  Medication Sig Dispense Refill  . ENSTILAR 0.005-0.064 % FOAM Apply 1 application topically as needed.    Marland Kitchen losartan (COZAAR) 25 MG tablet Take 1 tablet by mouth daily.    . Multiple Vitamin (MULTIVITAMIN) capsule Take 1 capsule by mouth daily.    . Omega-3 Fatty Acids (FISH OIL PO) Take 1 capsule by mouth daily.    Marland Kitchen OVER THE COUNTER MEDICATION Bone nutrient     No current facility-administered medications on file prior to visit.     Allergies: Patient has no known allergies.  Review of Systems  Constitutional: Negative.  Negative for chills, diaphoresis, fever, malaise/fatigue and weight loss.  HENT: Negative.  Negative for congestion, sore throat and tinnitus.   Eyes: Negative.  Negative for blurred vision, double vision and photophobia.  Respiratory: Negative.  Negative for cough and wheezing.   Cardiovascular: Negative.  Negative for chest pain and palpitations.  Gastrointestinal: Negative.  Negative for blood in stool, diarrhea, nausea and vomiting.  Genitourinary: Negative.  Negative for dysuria, frequency and urgency.  Musculoskeletal: Negative.  Negative for joint pain and myalgias.  Skin: Negative.  Negative for itching and rash.  Neurological: Negative.   Negative for dizziness, focal weakness, weakness and headaches.  Endo/Heme/Allergies: Negative.  Negative for environmental allergies and polydipsia. Does not bruise/bleed easily.  Psychiatric/Behavioral: Negative.  Negative for depression and memory loss. The patient is not nervous/anxious and does not have insomnia.      Objective:    Blood pressure 117/78, pulse 70, height 5' 5.25" (1.657 m), weight 172 lb (78 kg), last menstrual period 03/08/2016. Body mass index is 28.4 kg/m. General Appearance:    Alert, cooperative, no distress, appears stated age  Head:    Normocephalic, without obvious abnormality, atraumatic  Eyes:    PERRL, conjunctiva/corneas clear, EOM's intact, fundi    benign, both eyes  Ears:    Normal TM's and external ear canals, both ears  Nose:   Nares normal, septum midline, mucosa normal,  no drainage    or sinus tenderness  Throat:   Lips w/o lesion, mucosa moist, and tongue normal; teeth and   gums normal  Neck:   Supple, symmetrical, trachea midline, no adenopathy;    thyroid:  no enlargement/tenderness/nodules; no carotid   bruit or JVD  Back:     Symmetric, no curvature, ROM normal, no CVA tenderness  Lungs:     Clear to auscultation bilaterally, respirations unlabored, no       Wh/ R/ R  Chest Wall:    No tenderness or gross deformity; normal excursion   Heart:    Regular rate and rhythm, S1 and S2 normal, no murmur, rub   or gallop  Breast Exam:    No tenderness, masses, or nipple abnormality b/l; no d/c  Abdomen:     Soft, non-tender, bowel sounds active all four quadrants, NO   G/R/R, no masses, no organomegaly  Genitalia:    Ext genitalia: without lesion, no rash or discharge, No         tenderness;  Cervix: WNL's w/o discharge or lesion;        Adnexa:  No tenderness or palpable masses   Rectal:    Normal tone, no masses or tenderness;   guaiac negative stool  Extremities:   Extremities normal, atraumatic, no cyanosis or gross edema  Pulses:   2+ and  symmetric all extremities  Skin:   Warm, dry, Skin color, texture, turgor normal, no obvious rashes or lesions Psych: No HI/SI, judgement and insight good, Euthymic mood. Full Affect.  Neurologic:   CNII-XII intact, normal strength, sensation and reflexes    Throughout    Recent Results (from the past 2160 hour(s))  Basic metabolic panel     Status: None   Collection Time: 01/14/16 12:00 AM  Result Value Ref Range   Glucose 80 mg/dL   Creatinine 0.8 0.5 - 1.1 mg/dL  Lipid panel     Status: Abnormal   Collection Time: 01/14/16 12:00 AM  Result Value Ref Range   Triglycerides 164 (A) 40 - 160 mg/dL   Cholesterol 163 0 - 200 mg/dL   HDL 50 35 - 70 mg/dL   LDL Cholesterol 80 mg/dL  Hemoglobin A1c     Status: None   Collection Time: 01/14/16 12:00 AM  Result Value Ref Range   Hemoglobin A1C 4.9   CBC with Differential/Platelet     Status: None   Collection Time: 03/12/16  9:39 AM  Result Value Ref Range   WBC 5.2 3.8 - 10.8 K/uL   RBC 4.34 3.80 - 5.10 MIL/uL   Hemoglobin 13.2 11.7 - 15.5 g/dL   HCT 39.6 35.0 - 45.0 %   MCV 91.2 80.0 - 100.0 fL   MCH 30.4 27.0 - 33.0 pg   MCHC 33.3 32.0 - 36.0 g/dL   RDW 12.9 11.0 - 15.0 %   Platelets 227 140 - 400 K/uL   MPV 10.1 7.5 - 12.5 fL   Neutro Abs 3,380 1,500 - 7,800 cells/uL   Lymphs Abs 1,352 850 - 3,900 cells/uL   Monocytes Absolute 416 200 - 950 cells/uL   Eosinophils Absolute 52 15 - 500 cells/uL   Basophils Absolute 0 0 - 200 cells/uL   Neutrophils Relative % 65 %   Lymphocytes Relative 26 %   Monocytes Relative 8 %   Eosinophils Relative 1 %   Basophils Relative 0 %   Smear Review Criteria for review not met   Comprehensive metabolic panel  Status: Abnormal   Collection Time: 03/12/16  9:39 AM  Result Value Ref Range   Sodium 141 135 - 146 mmol/L   Potassium 4.2 3.5 - 5.3 mmol/L   Chloride 106 98 - 110 mmol/L   CO2 27 20 - 31 mmol/L   Glucose, Bld 74 65 - 99 mg/dL   BUN 13 7 - 25 mg/dL   Creat 0.79 0.50 - 1.05  mg/dL    Comment:   For patients > or = 53 years of age: The upper reference limit for Creatinine is approximately 13% higher for people identified as African-American.      Total Bilirubin 0.7 0.2 - 1.2 mg/dL   Alkaline Phosphatase 58 33 - 130 U/L   AST 13 10 - 35 U/L   ALT 15 6 - 29 U/L   Total Protein 6.0 (L) 6.1 - 8.1 g/dL   Albumin 3.8 3.6 - 5.1 g/dL   Calcium 8.8 8.6 - 10.4 mg/dL  TSH     Status: None   Collection Time: 03/12/16  9:39 AM  Result Value Ref Range   TSH 2.23 mIU/L    Comment:   Reference Range   > or = 20 Years  0.40-4.50   Pregnancy Range First trimester  0.26-2.66 Second trimester 0.55-2.73 Third trimester  0.43-2.91     VITAMIN D 25 Hydroxy (Vit-D Deficiency, Fractures)     Status: Abnormal   Collection Time: 03/12/16  9:39 AM  Result Value Ref Range   Vit D, 25-Hydroxy 15 (L) 30 - 100 ng/mL    Comment: Vitamin D Status           25-OH Vitamin D        Deficiency                <20 ng/mL        Insufficiency         20 - 29 ng/mL        Optimal             > or = 30 ng/mL   For 25-OH Vitamin D testing on patients on D2-supplementation and patients for whom quantitation of D2 and D3 fractions is required, the [facility] 25-OH VIT D, (D2,D3), LC/MS/MS is recommended: order code [id number] (patients > 2 yrs).

## 2016-03-12 NOTE — Patient Instructions (Signed)
For those diagnosed with high triglycerides, it's important to take action to lower your levels and improve your heart health.  Triglyceride is just a fancy word for fat - the fat in our bodies is stored in the form of triglycerides. Triglycerides are found in foods and manufactured in our bodies.  Normal triglyceride levels are defined as less than 150 mg/dL; 150 to 199 is considered borderline high; 200 to 499 is high; and 500 or higher is officially called very high. To me, anything over 150 is a red flag indicating my patient needs to take immediate steps to get the situation under control.   What is the significance of high triglycerides? High triglyceride levels make blood thicker and stickier, which means that it is more likely to form clots. Studies have shown that triglyceride levels are associated with increased risks of cardiovascular disease and stroke - in both men and women - alone or in combination with other risk factors (high triglycerides combined with high LDL cholesterol can be a particularly deadly combination). For example, in one ground-breaking study, high triglycerides alone increased the risk of cardiovascular disease by 14 percent in men, and by 56 percent in women. But when the test subjects also had low HDL cholesterol (that's the good cholesterol) and other risk factors, high triglycerides increased the risk of disease by 32 percent in men and 76 percent in women.   Fortunately, triglycerides can sometimes be controlled with several diet and lifestyle changes.    What Factors Can Increase Triglycerides? As with cholesterol, eating too much of the wrong kinds of fats will raise your blood triglycerides.  Therefore, it's important to restrict the amounts of saturated fats and trans fats you allow into your diet.  Triglyceride levels can also shoot up after eating foods that are high in carbohydrates or after drinking alcohol.  That's why triglyceride blood tests require an  overnight fast.  If you have elevated triglycerides, it's especially important to avoid sugary and refined carbohydrates, including sugar, honey, and other sweeteners, soda and other sugary drinks, candy, baked goods, and anything made with white (refined or enriched) flour, including white bread, rolls, cereals, buns, pastries, regular pasta, and white rice.  You'll also want to limit dried fruit and fruit juice since they're dense in simple sugar.  All of these low-quality carbs cause a sudden rise in insulin, which may lead to a spike in triglycerides.  Triglycerides can also become elevated as a reaction to having diabetes, hypothyroidism, or kidney disease. As with most other heart-related factors, being overweight and inactive also contribute to abnormal triglycerides. And unfortunately, some people have a genetic predisposition that causes them to manufacture way too much triglycerides on their own, no matter how carefully they eat.   How Can You Lower Your Triglyceride Levels? If you are diagnosed with high triglycerides, it's important to take action. There are several things you can do to help lower your triglyceride levels and improve your heart health:  Lose weight if you are overweight.  There is a clear correlation between obesity and high triglycerides - the heavier people are, the higher their triglyceride levels are likely to be. The good news is that losing weight can significantly lower triglycerides. In a large study of individuals with type 2 diabetes, those assigned to the "lifestyle intervention group" - which involved counseling, a low-calorie meal plan, and customized exercise program - lost 8.6% of their body weight and lowered their triglyceride levels by more than 16%. If you're  overweight, find a weight loss plan that works for you and commit to shedding the pounds and getting healthier.  Reduce the amount of saturated fat and trans fat in your diet.  Start by avoiding or  dramatically limiting butter, cream cheese, lard, sour cream, doughnuts, cakes, cookies, candy bars, regular ice cream, fried foods, pizza, cheese sauce, cream-based sauces and salad dressings, high-fat meats (including fatty hamburgers, bologna, pepperoni, sausage, bacon, salami, pastrami, spareribs, and hot dogs), high-fat cuts of beef and pork, and whole-milk dairy products.   Other ways to cut back: Choose lean meats only (including skinless chicken and [place], lean beef, lean pork), fish, and reduced-fat or fat-free dairy products.   Experiment with adding whole soy foods to your diet. Although soy itself may not reduce risk of heart disease, it replaces hazardous animal fats with healthier proteins. Choose high-quality soy foods, such as tofu, tempeh, soy milk, and edamame (whole soybeans).  Always remove skin from poultry.  Prepare foods by baking, roasting, broiling, boiling, poaching, steaming, grilling, or stir-frying in vegetable oil.  Most stick margarines contain trans fats, and trans fats are also found in some packaged baked goods, potato chips, snack foods, fried foods, and fast food that use or create hydrogenated oils.    (All food labels must now list the amount of trans fats, right after the amount of saturated fats - good news for consumers. As a result, many food companies have now reformulated their products to be trans fat free.many, but not all! So it's still just as important to read labels and make sure the packaged foods you buy don't contain trans fats.)     If you use margarine, purchase soft-tub margarine spreads that contain 0 grams trans fats and don't list any partially hydrogenated oils in the ingredients list. By substituting olive oil or vegetable oil for trans fats in just 2 percent of your daily calories, you can reduce your risk of heart disease by 53 percent.   There is no safe amount of trans fats, so try to keep them as far from your plate as possible.  Avoid  foods that are concentrated in sugar (even dried fruit and fruit juice). Sugary foods can elevate triglyceride levels in the blood, so keep them to a bare minimum.  Swap out refined carbohydrates for whole grains.  Refined carbohydrates - like white rice, regular pasta, and anything made with white or "enriched" flour (including white bread, rolls, cereals, buns, and crackers) - raise blood sugar and insulin levels more than fiber-rich whole grains. Higher insulin levels, in turn, can lead to a higher rise in triglycerides after a meal. So, make the switch to whole wheat bread, whole grain pasta, brown or wild rice, and whole grain versions of cereals, crackers, and other bread products. However, it's important to know that individuals with high triglycerides should moderate even their intake of high-quality starches (since all starches raise blood sugar) - I recommend 1 to 2 servings per meal.  Cut way back on alcohol.  If you have high triglycerides, alcohol should be considered a rare treat - if you indulge at all, since even small amounts of alcohol can dramatically increase triglyceride levels.  Incorporate omega-3 fats.  Heart-healthy fish oils are especially rich in omega-3 fatty acids. In multiple studies over the past two decades, people who ate diets high in omega-3s had 30 to 40 percent reductions in heart disease. Although we don't yet know why fish oil works so well, there are several  possibilities. Omega-3s seem to reduce inflammation, reduce high blood pressure, decrease triglycerides, raise HDL cholesterol, and make blood thinner and less sticky so it is less likely to clot. It's as close to a food prescription for heart health as it gets. If you have high triglycerides, I recommend eating at least three servings of one of the omega-3-rich fish every week (fatty fish is the most concentrated food form of omega three fats). If you cannot manage to eat that much fish, speak with your physician  about taking fish oil capsules, which offer similar benefits.The best foods for omega-3 fatty acids include wild salmon (fresh, canned), herring, mackerel (not king), sardines, anchovies, rainbow trout, and Pacific oysters. Non-fish sources of omega-3 fats include omega-3-fortified eggs, ground flaxseed, chia seeds, walnuts, butternuts (white walnuts), seaweed, walnut oil, canola oil, and soybeans.  Quit smoking.  Smoking causes inflammation, not just in your lungs, but throughout your body. Inflammation can contribute to atherosclerosis, blood clots, and risk of heart attack. Smoking makes all heart health indicators worse. If you have high cholesterol, high triglycerides, or high blood pressure, smoking magnifies the danger.  Become more physically active.  Even moderate exercise can help improve cholesterol, triglycerides, and blood pressure. Aerobic exercise seems to be able to stop the sharp rise of triglycerides after eating, perhaps because of a decrease in the amount of triglyceride released by the liver, or because active muscle clears triglycerides out of the blood stream more quickly than inactive muscle. If you haven't exercised regularly (or at all) for years, I recommend starting slowly, by walking at an easy pace for 15 minutes a day. Then, as you feel more comfortable, increase the amount. Your ultimate goal should be at least 30 minutes of moderate physical activity, at least five days a week.    Please realize, EXERCISE IS MEDICINE!  -  American Heart Association [facility]) guidelines for exercise : If you are in good health, without any medical conditions, you should engage in 150 minutes of moderate intensity aerobic activity per week.  This means you should be huffing and puffing throughout your workout.   Engaging in regular exercise will improve brain function and memory, as well as improve mood, boost immune system and help with weight management.  As well as the other, more  well-known effects of exercise such as decreasing blood sugar levels, decreasing blood pressure,  and decreasing bad cholesterol levels/ increasing good cholesterol levels.     -  The AHA strongly endorses consumption of a diet that contains a variety of foods from all the food categories with an emphasis on fruits and vegetables; fat-free and low-fat dairy products; cereal and grain products; legumes and nuts; and fish, poultry, and/or extra lean meats.    Excessive food intake, especially of foods high in saturated and trans fats, sugar, and salt, should be avoided.    Adequate water intake of roughly 1/2 of your weight in pounds, should equal the ounces of water per day you should drink.  So for instance, if you're 200 pounds, that would be 100 ounces of water per day.         Mediterranean Diet  Why follow it? Research shows. . Those who follow the Mediterranean diet have a reduced risk of heart disease  . The diet is associated with a reduced incidence of Parkinson's and Alzheimer's diseases . People following the diet may have longer life expectancies and lower rates of chronic diseases  . The Dietary Guidelines for Americans  recommends the Mediterranean diet as an eating plan to promote health and prevent disease  What Is the Mediterranean Diet?  . Healthy eating plan based on typical foods and recipes of Mediterranean-style cooking . The diet is primarily a plant based diet; these foods should make up a majority of meals   Starches - Plant based foods should make up a majority of meals - They are an important sources of vitamins, minerals, energy, antioxidants, and fiber - Choose whole grains, foods high in fiber and minimally processed items  - Typical grain sources include wheat, oats, barley, corn, brown rice, bulgar, farro, millet, polenta, couscous  - Various types of beans include chickpeas, lentils, fava beans, black beans, white beans   Fruits  Veggies - Large quantities of  antioxidant rich fruits & veggies; 6 or more servings  - Vegetables can be eaten raw or lightly drizzled with oil and cooked  - Vegetables common to the traditional Mediterranean Diet include: artichokes, arugula, beets, broccoli, brussel sprouts, cabbage, carrots, celery, collard greens, cucumbers, eggplant, kale, leeks, lemons, lettuce, mushrooms, okra, onions, peas, peppers, potatoes, pumpkin, radishes, rutabaga, shallots, spinach, sweet potatoes, turnips, zucchini - Fruits common to the Mediterranean Diet include: apples, apricots, avocados, cherries, clementines, dates, figs, grapefruits, grapes, melons, nectarines, oranges, peaches, pears, pomegranates, strawberries, tangerines  Fats - Replace butter and margarine with healthy oils, such as olive oil, canola oil, and tahini  - Limit nuts to no more than a handful a day  - Nuts include walnuts, almonds, pecans, pistachios, pine nuts  - Limit or avoid candied, honey roasted or heavily salted nuts - Olives are central to the [employer] - can be eaten whole or used in a variety of dishes   Meats Protein - Limiting red meat: no more than a few times a month - When eating red meat: choose lean cuts and keep the portion to the size of deck of cards - Eggs: approx. 0 to 4 times a week  - Fish and lean poultry: at least 2 a week  - Healthy protein sources include, chicken, [place], lean beef, lamb - Increase intake of seafood such as tuna, salmon, trout, mackerel, shrimp, scallops - Avoid or limit high fat processed meats such as sausage and bacon  Dairy - Include moderate amounts of low fat dairy products  - Focus on healthy dairy such as fat free yogurt, skim milk, low or reduced fat cheese - Limit dairy products higher in fat such as whole or 2% milk, cheese, ice cream  Alcohol - Moderate amounts of red wine is ok  - No more than 5 oz daily for women (all ages) and men older than age 35  - No more than 10 oz of wine daily for men younger  than 73  Other - Limit sweets and other desserts  - Use herbs and spices instead of salt to flavor foods  - Herbs and spices common to the traditional Mediterranean Diet include: basil, bay leaves, chives, cloves, cumin, fennel, garlic, lavender, marjoram, mint, oregano, parsley, pepper, rosemary, sage, savory, sumac, tarragon, thyme   It's not just a diet, it's a lifestyle:  . The Mediterranean diet includes lifestyle factors typical of those in the region  . Foods, drinks and meals are best eaten with others and savored . Daily physical activity is important for overall good health . This could be strenuous exercise like running and aerobics . This could also be more leisurely activities such as walking, housework, yard-work, or taking  the stairs . Moderation is the key; a balanced and healthy diet accommodates most foods and drinks . Consider portion sizes and frequency of consumption of certain foods   Meal Ideas & Options:  . Breakfast:  o Whole wheat toast or whole wheat English muffins with peanut butter & hard boiled egg o Steel cut oats topped with apples & cinnamon and skim milk  o Fresh fruit: banana, strawberries, melon, berries, peaches  o Smoothies: strawberries, bananas, greek yogurt, peanut butter o Low fat greek yogurt with blueberries and granola  o Egg white omelet with spinach and mushrooms o Breakfast couscous: whole wheat couscous, apricots, skim milk, cranberries  . Sandwiches:  o Hummus and grilled vegetables (peppers, zucchini, squash) on whole wheat bread   o Grilled chicken on whole wheat pita with lettuce, tomatoes, cucumbers or tzatziki  o Tuna salad on whole wheat bread: tuna salad made with greek yogurt, olives, red peppers, capers, green onions o Garlic rosemary lamb pita: lamb sauted with garlic, rosemary, salt & pepper; add lettuce, cucumber, greek yogurt to pita - flavor with lemon juice and black pepper  . Seafood:  o Mediterranean grilled salmon,  seasoned with garlic, basil, parsley, lemon juice and black pepper o Shrimp, lemon, and spinach whole-grain pasta salad made with low fat greek yogurt  o Seared scallops with lemon orzo  o Seared tuna steaks seasoned salt, pepper, coriander topped with tomato mixture of olives, tomatoes, olive oil, minced garlic, parsley, green onions and cappers  . Meats:  o Herbed greek chicken salad with kalamata olives, cucumber, feta  o Red bell peppers stuffed with spinach, bulgur, lean ground beef (or lentils) & topped with feta   o Kebabs: skewers of chicken, tomatoes, onions, zucchini, squash  o [place] burgers: made with red onions, mint, dill, lemon juice, feta cheese topped with roasted red peppers . Vegetarian o Cucumber salad: cucumbers, artichoke hearts, celery, red onion, feta cheese, tossed in olive oil & lemon juice  o Hummus and whole grain pita points with a greek salad (lettuce, tomato, feta, olives, cucumbers, red onion) o Lentil soup with celery, carrots made with vegetable broth, garlic, salt and pepper  o Tabouli salad: parsley, bulgur, mint, scallions, cucumbers, tomato, radishes, lemon juice, olive oil, salt and pepper.

## 2016-03-13 LAB — COMPREHENSIVE METABOLIC PANEL
ALT: 15 U/L (ref 6–29)
AST: 13 U/L (ref 10–35)
Albumin: 3.8 g/dL (ref 3.6–5.1)
Alkaline Phosphatase: 58 U/L (ref 33–130)
BUN: 13 mg/dL (ref 7–25)
CHLORIDE: 106 mmol/L (ref 98–110)
CO2: 27 mmol/L (ref 20–31)
CREATININE: 0.79 mg/dL (ref 0.50–1.05)
Calcium: 8.8 mg/dL (ref 8.6–10.4)
GLUCOSE: 74 mg/dL (ref 65–99)
POTASSIUM: 4.2 mmol/L (ref 3.5–5.3)
SODIUM: 141 mmol/L (ref 135–146)
Total Bilirubin: 0.7 mg/dL (ref 0.2–1.2)
Total Protein: 6 g/dL — ABNORMAL LOW (ref 6.1–8.1)

## 2016-03-13 LAB — CBC WITH DIFFERENTIAL/PLATELET
Basophils Absolute: 0 cells/uL (ref 0–200)
Basophils Relative: 0 %
EOS ABS: 52 {cells}/uL (ref 15–500)
Eosinophils Relative: 1 %
HCT: 39.6 % (ref 35.0–45.0)
Hemoglobin: 13.2 g/dL (ref 11.7–15.5)
LYMPHS PCT: 26 %
Lymphs Abs: 1352 cells/uL (ref 850–3900)
MCH: 30.4 pg (ref 27.0–33.0)
MCHC: 33.3 g/dL (ref 32.0–36.0)
MCV: 91.2 fL (ref 80.0–100.0)
MONOS PCT: 8 %
MPV: 10.1 fL (ref 7.5–12.5)
Monocytes Absolute: 416 cells/uL (ref 200–950)
NEUTROS ABS: 3380 {cells}/uL (ref 1500–7800)
Neutrophils Relative %: 65 %
PLATELETS: 227 10*3/uL (ref 140–400)
RBC: 4.34 MIL/uL (ref 3.80–5.10)
RDW: 12.9 % (ref 11.0–15.0)
WBC: 5.2 10*3/uL (ref 3.8–10.8)

## 2016-03-13 LAB — VITAMIN D 25 HYDROXY (VIT D DEFICIENCY, FRACTURES): Vit D, 25-Hydroxy: 15 ng/mL — ABNORMAL LOW (ref 30–100)

## 2016-03-13 LAB — TSH: TSH: 2.23 mIU/L

## 2016-03-16 ENCOUNTER — Other Ambulatory Visit: Payer: Self-pay

## 2016-03-16 MED ORDER — VITAMIN D (ERGOCALCIFEROL) 1.25 MG (50000 UNIT) PO CAPS: 50000.0000 [IU] | ORAL_CAPSULE | ORAL | 10 refills | Status: DC

## 2016-03-28 DIAGNOSIS — E559 Vitamin D deficiency, unspecified: Secondary | ICD-10-CM | POA: Insufficient documentation

## 2016-03-28 DIAGNOSIS — E781 Pure hyperglyceridemia: Secondary | ICD-10-CM | POA: Insufficient documentation

## 2016-03-28 NOTE — Assessment & Plan Note (Signed)
Pt's Vitamin D level is too low.  I recommend pt take a once weekly dose of 50 K IUs.  Please e-prescribe this and dispense #12 tablets with 10 refills.   Inform patient this will likely be a lifelong thing, and we will monitor it yearly.   In addition to the once weekly prescription dose, pt needs to take an OTC daily dose of 5,000 IUs of vitamin D3.  

## 2016-03-28 NOTE — Assessment & Plan Note (Signed)
Dietary changes such as low saturated & trans fat and low carb/ ketogenic diets discussed with patient.  Encouraged regular exercise and weight loss when appropriate.   Educational handouts provided at patient's desire.  Contact us prior with any Q's/ concerns. 

## 2016-05-19 ENCOUNTER — Ambulatory Visit
Admission: RE | Admit: 2016-05-19 | Discharge: 2016-05-19 | Disposition: A | Discharge status: Home / Self Care | Payer: 59 | Source: Ambulatory Visit | Attending: Obstetrics and Gynecology | Admitting: Obstetrics and Gynecology

## 2016-05-19 DIAGNOSIS — N6009 Solitary cyst of unspecified breast: Secondary | ICD-10-CM

## 2016-07-02 ENCOUNTER — Other Ambulatory Visit: Payer: Self-pay

## 2016-07-02 MED ORDER — VITAMIN D (ERGOCALCIFEROL) 1.25 MG (50000 UNIT) PO CAPS: 50000.0000 [IU] | ORAL_CAPSULE | ORAL | 10 refills | Status: DC

## 2016-09-06 ENCOUNTER — Ambulatory Visit: Payer: 59 | Admitting: Adult Health

## 2016-09-06 ENCOUNTER — Ambulatory Visit: Payer: 59 | Admitting: Family Medicine

## 2016-09-26 ENCOUNTER — Other Ambulatory Visit: Payer: Self-pay | Admitting: Obstetrics and Gynecology

## 2016-10-07 ENCOUNTER — Ambulatory Visit: Payer: 59 | Admitting: Family Medicine

## 2016-10-15 ENCOUNTER — Encounter: Payer: Self-pay | Admitting: Family Medicine

## 2016-10-15 ENCOUNTER — Ambulatory Visit (INDEPENDENT_AMBULATORY_CARE_PROVIDER_SITE_OTHER): Payer: 59 | Admitting: Family Medicine

## 2016-10-15 VITALS — BP 122/87 | HR 70 | Temp 98.5°F | Ht 65.25 in | Wt 166.0 lb

## 2016-10-15 DIAGNOSIS — I10 Essential (primary) hypertension: Secondary | ICD-10-CM | POA: Diagnosis not present

## 2016-10-15 DIAGNOSIS — E663 Overweight: Secondary | ICD-10-CM | POA: Diagnosis not present

## 2016-10-15 DIAGNOSIS — E781 Pure hyperglyceridemia: Secondary | ICD-10-CM | POA: Diagnosis not present

## 2016-10-15 DIAGNOSIS — E559 Vitamin D deficiency, unspecified: Secondary | ICD-10-CM | POA: Diagnosis not present

## 2016-10-15 MED ORDER — VITAMIN D (ERGOCALCIFEROL) 1.25 MG (50000 UNIT) PO CAPS: 50000.0000 [IU] | ORAL_CAPSULE | ORAL | 10 refills | Status: DC

## 2016-10-15 MED ORDER — LOSARTAN POTASSIUM 25 MG PO TABS: 25.0000 mg | ORAL_TABLET | Freq: Every day | ORAL | 3 refills | Status: DC

## 2016-10-15 NOTE — Progress Notes (Signed)
Impression and Recommendations:    1. Essential hypertension   2. Hypertriglyceridemia   3. Overweight (BMI 25.0-29.9)   4. Vitamin D deficiency     Hypertension RF med for BP---   BP at goal.   DASH; purdent diet and exercise d/c pt  - due to get labs from her work- [facility] in [place] every Oct so she declines to make appt for the future with [place].    - She will bring [place] her results once she gets them  Vitamin D deficiency Pt wants to obtain labs if needed once [facility] labs are reviewed.    - RF given and we may obtian labs this fall  Hypertriglyceridemia Await labs.   - low carb diet d/c pt--> handouts givne if desired.    Education and routine counseling performed. Handouts provided.   Meds ordered this encounter  Medications  . losartan (COZAAR) 25 MG tablet    Sig: Take 1 tablet (25 mg total) by mouth daily.    Dispense:  90 tablet    Refill:  3    ANNUAL DUE 7/18  . Vitamin D, Ergocalciferol, (DRISDOL) 50000 units CAPS capsule    Sig: Take 1 capsule (50,000 Units total) by mouth every 7 (seven) days.    Dispense:  12 capsule    Refill:  10    Return in about 6 months (around 04/17/2017).  The patient was counseled, risk factors were discussed, anticipatory guidance given.  Gross side effects, risk and benefits, and alternatives of medications discussed with patient.  Patient is aware that all medications have potential side effects and we are unable to predict every side effect or drug-drug interaction that may occur.  Expresses verbal understanding and consents to current therapy plan and treatment regimen.  Please see AVS handed out to patient at the end of our visit for further patient instructions/ counseling done pertaining to today's office visit.    Note: This document was prepared using Dragon voice recognition software and may include unintentional dictation errors.     Subjective:    Chief Complaint  Patient presents with  .  Hypertension    HPI: Amber Henson is a 56 y.o. female who presents to [place] at [facility] today for follow up for HTN; q 70mo f/up per pt.    - Has GYN- gets her yrly PE, mammos, paps etc from them.     Vit D Def:   doesn;t feel much diff with supp but takes it and says she needs RF     HTN:  -  Her blood pressure has been controlled at home.   - Patient reports good compliance with blood pressure medications  - Denies medication S-E-   needs RF.   Doesn't want to change anything as she feels good.    - Smoking Status noted   - She denies new onset of: chest pain, exercise intolerance, shortness of breath, dizziness, visual changes, headache, lower extremity swelling or claudication.   Today their BP is BP: 122/87   Last 3 blood pressure readings in our office are as follows: BP Readings from Last 3 Encounters:  10/15/16 122/87  03/12/16 117/78  01/05/16 116/78    Pulse Readings from Last 3 Encounters:  10/15/16 70  03/12/16 70  01/05/16 71    Filed Weights   10/15/16 0930  Weight: 166 lb (75.3 kg)      Patient Care Team    Relationship Specialty Notifications  Start End  [name], DO PCP - General Family Medicine  12/22/13   Copland, Ginette Otto Referring Physician Physician Assistant  10/30/14    Comment: PA  under  Gynecologist :  Glean Salen, M.D.-   [facility] in [place], Forest Gleason, MD  General Surgery  10/30/14   [facility]  Dermatology  03/12/16      Lab Results  Component Value Date   CREATININE 0.79 03/12/2016   BUN 13 03/12/2016   NA 141 03/12/2016   K 4.2 03/12/2016   CL 106 03/12/2016   CO2 27 03/12/2016    Lab Results  Component Value Date   CHOL 163 01/14/2016    Lab Results  Component Value Date   HDL 50 01/14/2016    Lab Results  Component Value Date   LDLCALC 80 01/14/2016    Lab Results  Component Value Date   TRIG 164 (A) 01/14/2016    No results found for:  CHOLHDL  No results found for: LDLDIRECT ===================================================================   Patient Active Problem List   Diagnosis Date Noted  . Vitamin D deficiency 03/28/2016    Priority: High  . Hypertriglyceridemia 03/28/2016    Priority: High  . Hypertension 01/10/2016    Priority: High  . Overweight (BMI 25.0-29.9) 01/05/2016    Priority: High  . Mass of right breast on mammogram 01/10/2016  . Diffuse cystic mastopathy of both breasts (R>L) 01/10/2016  . Family history of diabetes mellitus-father 01/10/2016  . Family history of ovarian cancer- sister age 57 01/10/2016  . Family history of essential hypertension- in 62s and early 50s 01/10/2016  . Family history of early CAD 01/05/2016  . History of shingles 01/05/2016  . h/o Hemorrhoid- sx repair 6/16 11/01/2014     Past Medical History:  Diagnosis Date  . Hypertension      Past Surgical History:  Procedure Laterality Date  . BREAST BIOPSY Left 10/2014   negative  . COLONOSCOPY N/A 11/20/2014   Procedure: COLONOSCOPY;  Surgeon: Robert Bellow, MD;  Location: [facility] ENDOSCOPY;  Service: Endoscopy;  Laterality: N/A;  . HEMORRHOID SURGERY N/A 11/20/2014   Procedure: HEMORRHOIDECTOMY;  Surgeon: Robert Bellow, MD;  Location: [facility] ORS;  Service: General;  Laterality: N/A;     Family History  Problem Relation Age of Onset  . Hypertension Mother   . Colonic polyp Mother   . Diabetes Father   . Hypertension Father   . Heart attack Father   . Cancer Sister        ovarian  . Heart attack Brother   . Hypertension Brother      History  Drug Use No  ,  History  Alcohol Use No  ,  History  Smoking Status  . Never Smoker  Smokeless Tobacco  . Never Used  ,    Current Outpatient Prescriptions on File Prior to Visit  Medication Sig Dispense Refill  . ENSTILAR 0.005-0.064 % FOAM Apply 1 application topically as needed.    . Multiple Vitamin (MULTIVITAMIN) capsule Take 1 capsule by  mouth daily.    . Omega-3 Fatty Acids (FISH OIL PO) Take 1 capsule by mouth daily.    Marland Kitchen OVER THE COUNTER MEDICATION Bone nutrient     No current facility-administered medications on file prior to visit.      No Known Allergies   Review of Systems:   General:  Denies fever, chills Optho/Auditory:   Denies visual changes, blurred vision Respiratory:  Denies SOB, cough, wheeze, DIB  Cardiovascular:   Denies chest pain, palpitations, painful respirations Gastrointestinal:   Denies nausea, vomiting, diarrhea.  Endocrine:     Denies new hot or cold intolerance Musculoskeletal:  Denies joint swelling, gait issues, or new unexplained myalgias/ arthralgias Skin:  Denies rash, suspicious lesions  Neurological:    Denies dizziness, unexplained weakness, numbness  Psychiatric/Behavioral:   Denies mood changes  Objective:    Blood pressure 122/87, pulse 70, temperature 98.5 F (36.9 C), temperature source Oral, height 5' 5.25" (1.657 m), weight 166 lb (75.3 kg), SpO2 100 %.  Body mass index is 27.41 kg/m.  General: Well Developed, well nourished, and in no acute distress.  HEENT: Normocephalic, atraumatic, pupils equal round reactive to light, neck supple, No carotid bruits Skin: Warm and dry, cap RF less 2 sec Cardiac: Regular rate and rhythm, S1, S2 WNL's, no murmurs rubs or gallops Respiratory: ECTA B/L, Not using accessory muscles, speaking in full sentences. NeuroM-Sk: Ambulates w/o assistance, moves ext * 4 w/o difficulty, sensation grossly intact.  Ext: scant edema b/l lower ext Psych: No HI/SI, judgement and insight good, Euthymic mood. Full Affect.

## 2016-10-15 NOTE — Patient Instructions (Signed)

## 2016-10-16 NOTE — Assessment & Plan Note (Addendum)
RF med for BP---   BP at goal.   DASH; purdent diet and exercise d/c pt  - due to get labs from her work- [facility] in [place] every Oct so she declines to make appt for the future with [place].    - She will bring [place] her results once she gets them

## 2016-10-16 NOTE — Assessment & Plan Note (Signed)
Pt wants to obtain labs if needed once [facility] labs are reviewed.    - RF given and we may obtian labs this fall

## 2016-10-16 NOTE — Assessment & Plan Note (Signed)
Await labs.   - low carb diet d/c pt--> handouts givne if desired.

## 2016-10-29 ENCOUNTER — Encounter: Payer: Self-pay | Admitting: Family Medicine

## 2016-10-29 ENCOUNTER — Telehealth: Payer: Self-pay | Admitting: Family Medicine

## 2016-10-29 ENCOUNTER — Ambulatory Visit (INDEPENDENT_AMBULATORY_CARE_PROVIDER_SITE_OTHER): Payer: 59 | Admitting: Family Medicine

## 2016-10-29 VITALS — BP 134/91 | HR 56 | Wt 169.0 lb

## 2016-10-29 DIAGNOSIS — W57XXXA Bitten or stung by nonvenomous insect and other nonvenomous arthropods, initial encounter: Secondary | ICD-10-CM

## 2016-10-29 NOTE — Patient Instructions (Signed)
Please make a follow-up in about 4 weeks to discuss your chronic fatigue and daytime somnolence.  We will address these chronic issues then.  -

## 2016-10-29 NOTE — Telephone Encounter (Signed)
Patient complain of tick bite 1 week ago.  Small rash with black spot in middle continues to itch.  Denies fever that she can tell.  Patient complains of weakness and fatigue now.  Concerned.  Appointment scheduled.

## 2016-10-29 NOTE — Progress Notes (Signed)
Pt here for an acute care OV today   Impression and Recommendations:    1. Insect bite, initial encounter- unknown what bit her      No problem-specific Assessment & Plan notes found for this encounter.   The patient was counseled, risk factors were discussed, anticipatory guidance given.    No orders of the defined types were placed in this encounter.    Discontinued Medications   No medications on file      Orders Placed This Encounter  Procedures  . Rocky mtn spotted fvr abs pnl(IgG+IgM)  . Ehrlichia antibody panel  . Lyme Disease, IgM, Early Test w/ Rflx     Gross side effects, risk and benefits, and alternatives of medications and treatment plan in general discussed with patient.  Patient is aware that all medications have potential side effects and we are unable to predict every side effect or drug-drug interaction that may occur.   Patient will call with any questions prior to using medication if they have concerns.  Expresses verbal understanding and consents to current therapy and treatment regimen.  No barriers to understanding were identified.  Red flag symptoms and signs discussed in detail.  Patient expressed understanding regarding what to do in case of emergency\urgent symptoms  Please see AVS handed out to patient at the end of our visit for further patient instructions/ counseling done pertaining to today's office visit.   Return in about 4 weeks (around 11/26/2016) for chornic fatigue etc.     Note: This document was prepared using Dragon voice recognition software and may include unintentional dictation errors.  Amber Henson 12:33 PM --------------------------------------------------------------------------------------------------------------------------------------------------------------------------------------------------------------------------------------------    Subjective:    CC:  Chief Complaint  Patient presents with  .  Insect Bite    tick bite 1 week ago  . Fatigue    HPI: Amber Henson is a 56 y.o. female who presents to [place] at [facility] today for issues as discussed below.  FOund tick 2 wks ago- in sleep she remembers scrathing it and "something came off".   We have tons onf ticks in yard / land etc.   Conhronic fatigue and daytime somluence for many yrs now, some mild HA last couple days, and chronically has difficulty staying awake .   NO F/C, NO myalgias/ arthralgias, no rash    Wt Readings from Last 3 Encounters:  10/29/16 169 lb (76.7 kg)  10/15/16 166 lb (75.3 kg)  03/12/16 172 lb (78 kg)   BP Readings from Last 3 Encounters:  10/29/16 (!) 134/91  10/15/16 122/87  03/12/16 117/78   BMI Readings from Last 3 Encounters:  10/29/16 27.91 kg/m  10/15/16 27.41 kg/m  03/12/16 28.40 kg/m     Patient Care Team    Relationship Specialty Notifications Start End  Amber Dance, DO PCP - General Family Medicine  07/23/33   Amalia Greenhouse Referring Physician Physician Assistant  10/30/14    Comment: PA  under  Gynecologist :  Glean Salen, M.D.-   [facility] in [place], Forest Gleason, MD  General Surgery  10/30/14   [facility]  Dermatology  03/12/16      Patient Active Problem List   Diagnosis Date Noted  . Vitamin D deficiency 03/28/2016    Priority: High  . Hypertriglyceridemia 03/28/2016    Priority: High  . Hypertension 01/10/2016    Priority: High  . Overweight (BMI 25.0-29.9) 01/05/2016    Priority: High  .  Mass of right breast on mammogram 01/10/2016  . Diffuse cystic mastopathy of both breasts (R>L) 01/10/2016  . Family history of diabetes mellitus-father 01/10/2016  . Family history of ovarian cancer- sister age 13 01/10/2016  . Family history of essential hypertension- in 30s and early 50s 01/10/2016  . Family history of early CAD 01/05/2016  . History of shingles 01/05/2016  . h/o Hemorrhoid- sx repair 6/16  11/01/2014    Past Medical history, Surgical history, Family history, Social history, Allergies and Medications have been entered into the medical record, reviewed and changed as needed.    Current Meds  Medication Sig  . ENSTILAR 0.005-0.064 % FOAM Apply 1 application topically as needed.  Marland Kitchen losartan (COZAAR) 25 MG tablet Take 1 tablet (25 mg total) by mouth daily.  . Multiple Vitamin (MULTIVITAMIN) capsule Take 1 capsule by mouth daily.  . Omega-3 Fatty Acids (FISH OIL PO) Take 1 capsule by mouth daily.  Marland Kitchen OVER THE COUNTER MEDICATION Bone nutrient  . Vitamin D, Ergocalciferol, (DRISDOL) 50000 units CAPS capsule Take 1 capsule (50,000 Units total) by mouth every 7 (seven) days.    Allergies:  No Known Allergies   Review of Systems: General:   Denies fever, chills, unexplained weight loss.  Optho/Auditory:   Denies visual changes, blurred vision/LOV Respiratory:   Denies wheeze, DOE more than baseline levels.  Cardiovascular:   Denies chest pain, palpitations, new onset peripheral edema  Gastrointestinal:   Denies nausea, vomiting, diarrhea, abd pain.  Genitourinary: Denies dysuria, freq/ urgency, flank pain or discharge from genitals.  Endocrine:     Denies hot or cold intolerance, polyuria, polydipsia. Musculoskeletal:   Denies unexplained myalgias, joint swelling, unexplained arthralgias, gait problems.  Skin:  Denies new onset rash, suspicious lesions Neurological:     Denies dizziness, unexplained weakness, numbness  Psychiatric/Behavioral:   Denies mood changes, suicidal or homicidal ideations, hallucinations    Objective:   Blood pressure (!) 134/91, pulse (!) 56, weight 169 lb (76.7 kg). Body mass index is 27.91 kg/m. General:  Well Developed, well nourished, appropriate for stated age.  Neuro:  Alert and oriented,  extra-ocular muscles intact  HEENT:  Normocephalic, atraumatic, neck supple Skin:  no gross rash, warm, pink. Cardiac:  RRR, S1 S2 Respiratory:  ECTA  B/L and A/P, Not using accessory muscles, speaking in full sentences- unlabored. Vascular:  Ext warm, no cyanosis apprec.; cap RF less 2 sec. Psych:  No HI/SI, judgement and insight good, Euthymic mood. Full Affect.

## 2016-10-29 NOTE — Telephone Encounter (Signed)
Pt called states had tick embedded on thigh and thinks having symptoms of fatigue etc.--pls call at [phone]. --glh

## 2016-11-01 ENCOUNTER — Encounter: Payer: Self-pay | Admitting: Adult Health

## 2016-11-01 ENCOUNTER — Ambulatory Visit (INDEPENDENT_AMBULATORY_CARE_PROVIDER_SITE_OTHER): Payer: 59 | Admitting: Adult Health

## 2016-11-01 DIAGNOSIS — H109 Unspecified conjunctivitis: Secondary | ICD-10-CM | POA: Insufficient documentation

## 2016-11-01 DIAGNOSIS — H1032 Unspecified acute conjunctivitis, left eye: Secondary | ICD-10-CM | POA: Diagnosis not present

## 2016-11-01 MED ORDER — GENTAMICIN SULFATE 0.3 % OP SOLN: 2.0000 [drp] | OPHTHALMIC | 0 refills | Status: DC

## 2016-11-01 NOTE — Patient Instructions (Signed)

## 2016-11-01 NOTE — Assessment & Plan Note (Signed)
Gentamicin 0.3% 2 gtt's Q4H until sx's resolve. Discard eye make-up. Wash pillowcase. Warm compress, OTC Acetaminophen or Ibuprofen for symptom control. Keep hands away from eye. Stay home for at least 24 hrs to reduce the risk of spreading. Please call clinic with any questions/concerns.

## 2016-11-01 NOTE — Progress Notes (Signed)
Subjective:    Patient ID: Amber Henson, female    DOB: 1961-02-23, 56 y.o.   MRN: [id number]  HPI:  Amber Henson presents with OS redness, excessive watering, and "ithcyness" that developed this morning.  She also reports OS matted shut with yellow crust when she woke up.  She has had "pink in the past and this feels exactly the same".  She denies change in vision/HAs.  She denies nasal drainage or URI sx's.  She does not wear contact lenses, only corrective lenses.   Patient Care Team    Relationship Specialty Notifications Start End  [name], DO PCP - General Family Medicine  [birth year]   Copland, Ginette Otto Referring Physician Physician Assistant  10/30/14    Comment: PA  under  Gynecologist :  Glean Salen, M.D.-   [facility] in [place], Forest Gleason, MD  General Surgery  10/30/14   [facility]  Dermatology  03/12/16     Patient Active Problem List   Diagnosis Date Noted  . Conjunctivitis of left eye 11/01/2016  . Vitamin D deficiency 03/28/2016  . Hypertriglyceridemia 03/28/2016  . Hypertension 01/10/2016  . Mass of right breast on mammogram 01/10/2016  . Diffuse cystic mastopathy of both breasts (R>L) 01/10/2016  . Family history of diabetes mellitus-father 01/10/2016  . Family history of ovarian cancer- sister age 68 01/10/2016  . Family history of essential hypertension- in 65s and early 50s 01/10/2016  . Family history of early CAD 01/05/2016  . Overweight (BMI 25.0-29.9) 01/05/2016  . History of shingles 01/05/2016  . h/o Hemorrhoid- sx repair 6/16 11/01/2014     Past Medical History:  Diagnosis Date  . Hypertension      Past Surgical History:  Procedure Laterality Date  . BREAST BIOPSY Left 10/2014   negative  . COLONOSCOPY N/A 11/20/2014   Procedure: COLONOSCOPY;  Surgeon: Robert Bellow, MD;  Location: [facility] ENDOSCOPY;  Service: Endoscopy;  Laterality: N/A;  . HEMORRHOID SURGERY N/A 11/20/2014   Procedure: HEMORRHOIDECTOMY;   Surgeon: Robert Bellow, MD;  Location: [facility] ORS;  Service: General;  Laterality: N/A;     Family History  Problem Relation Age of Onset  . Hypertension Mother   . Colonic polyp Mother   . Diabetes Father   . Hypertension Father   . Heart attack Father   . Cancer Sister        ovarian  . Heart attack Brother   . Hypertension Brother      History  Drug Use No     History  Alcohol Use No     History  Smoking Status  . Never Smoker  Smokeless Tobacco  . Never Used     Outpatient Encounter Prescriptions as of 11/01/2016  Medication Sig Note  . ENSTILAR 0.005-0.064 % FOAM Apply 1 application topically as needed. 01/05/2016: Received from: External Pharmacy  . losartan (COZAAR) 25 MG tablet Take 1 tablet (25 mg total) by mouth daily.   . Multiple Vitamin (MULTIVITAMIN) capsule Take 1 capsule by mouth daily.   . Omega-3 Fatty Acids (FISH OIL PO) Take 1 capsule by mouth daily.   Marland Kitchen OVER THE COUNTER MEDICATION Bone nutrient   . Vitamin D, Ergocalciferol, (DRISDOL) 50000 units CAPS capsule Take 1 capsule (50,000 Units total) by mouth every 7 (seven) days.   Marland Kitchen gentamicin (GARAMYCIN) 0.3 % ophthalmic solution Place 2 drops into the left eye every 4 (four) hours.    No facility-administered encounter medications on  file as of 11/01/2016.     Allergies: Patient has no known allergies.  Body mass index is 27.91 kg/m.  Blood pressure 104/70, pulse 62, height 5' 5.25" (1.657 m), weight 169 lb (76.7 kg), last menstrual period 10/17/2016.     Review of Systems  Eyes: Positive for discharge, redness and itching. Negative for pain and visual disturbance.  Respiratory: Negative for cough, chest tightness, shortness of breath, wheezing and stridor.   Cardiovascular: Negative for chest pain, palpitations and leg swelling.  Skin: Negative for color change, pallor, rash and wound.  Allergic/Immunologic: Negative for immunocompromised state.  Hematological: Does not bruise/bleed  easily.       Objective:   Physical Exam  Constitutional: She appears well-developed and well-nourished. No distress.  HENT:  Head: Normocephalic and atraumatic.  Right Ear: External ear normal.  Left Ear: External ear normal.  Eyes: EOM are normal. Pupils are equal, round, and reactive to light. Left eye exhibits exudate.    OS: Very reddened conjuctiva with profuse watering.  Swollen upper lip.  Skin: Skin is warm. She is not diaphoretic.  Psychiatric: She has a normal mood and affect. Her behavior is normal. Judgment and thought content normal.  Nursing note and vitals reviewed.         Assessment & Plan:   1. Acute bacterial conjunctivitis of left eye     Conjunctivitis of left eye Gentamicin 0.3% 2 gtt's Q4H until sx's resolve. Discard eye make-up. Wash pillowcase. Warm compress, OTC Acetaminophen or Ibuprofen for symptom control. Keep hands away from eye. Stay home for at least 24 hrs to reduce the risk of spreading. Please call clinic with any questions/concerns.    FOLLOW-UP:  Return if symptoms worsen or fail to improve.  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

## 2016-11-03 LAB — SPECIMEN STATUS

## 2016-11-03 LAB — ROCKY MTN SPOTTED FVR ABS PNL(IGG+IGM)
RMSF IgG: NEGATIVE
RMSF IgM: 0.24 index (ref 0.00–0.89)

## 2016-11-04 ENCOUNTER — Telehealth: Payer: Self-pay

## 2016-11-04 ENCOUNTER — Ambulatory Visit: Payer: 59 | Admitting: Obstetrics and Gynecology

## 2016-11-04 NOTE — Telephone Encounter (Signed)
Informed patient of lab results

## 2016-11-04 NOTE — Telephone Encounter (Signed)
-----   Message from [name], DO sent at 11/04/2016 12:53 PM EDT ----- Please let patient know the [facility] spotted fever labs came back negative but all other labs are pending.  Please have her call back in one week or look on my chart for results

## 2016-11-08 LAB — EHRLICHIA ANTIBODY PANEL
E. Chaffeensis (HME) IgM Titer: NEGATIVE
E.Chaffeensis (HME) IgG: NEGATIVE
HGE IGG TITER: NEGATIVE
HGE IgM Titer: NEGATIVE

## 2016-11-08 LAB — LYME, IGM, EARLY TEST/REFLEX: LYME DISEASE AB, QUANT, IGM: <0.8 index (ref 0.00–0.79)

## 2016-11-08 LAB — SPECIMEN STATUS REPORT

## 2016-11-30 ENCOUNTER — Ambulatory Visit: Payer: 59 | Admitting: Family Medicine

## 2016-12-16 ENCOUNTER — Ambulatory Visit (INDEPENDENT_AMBULATORY_CARE_PROVIDER_SITE_OTHER): Payer: 59 | Admitting: Obstetrics and Gynecology

## 2016-12-16 ENCOUNTER — Encounter: Payer: Self-pay | Admitting: Obstetrics and Gynecology

## 2016-12-16 ENCOUNTER — Ambulatory Visit: Payer: 59 | Admitting: Obstetrics and Gynecology

## 2016-12-16 VITALS — BP 124/78 | HR 89 | Ht 65.0 in | Wt 171.0 lb

## 2016-12-16 DIAGNOSIS — I10 Essential (primary) hypertension: Secondary | ICD-10-CM | POA: Diagnosis not present

## 2016-12-16 DIAGNOSIS — N814 Uterovaginal prolapse, unspecified: Secondary | ICD-10-CM | POA: Diagnosis not present

## 2016-12-16 DIAGNOSIS — Z1231 Encounter for screening mammogram for malignant neoplasm of breast: Secondary | ICD-10-CM | POA: Diagnosis not present

## 2016-12-16 DIAGNOSIS — Z1239 Encounter for other screening for malignant neoplasm of breast: Secondary | ICD-10-CM

## 2016-12-16 DIAGNOSIS — R928 Other abnormal and inconclusive findings on diagnostic imaging of breast: Secondary | ICD-10-CM

## 2016-12-16 DIAGNOSIS — Z01419 Encounter for gynecological examination (general) (routine) without abnormal findings: Secondary | ICD-10-CM | POA: Diagnosis not present

## 2016-12-16 MED ORDER — LOSARTAN POTASSIUM 25 MG PO TABS: 25.0000 mg | ORAL_TABLET | Freq: Every day | ORAL | 3 refills | Status: DC

## 2016-12-16 NOTE — Progress Notes (Signed)
Chief Complaint  Patient presents with  . Gynecologic Exam    HPI:      [name] is a 56 y.o. [id number] who LMP was Patient's last menstrual period was 12/06/2016., presents today for her annual examination.  Her menses are regular every 28-30 days, lasting 5 days. She missed 4/18 menses, had it early 5/18 and then again a little early 6/18. This is the first time it has happened. Dysmenorrhea none. She does not have intermenstrual bleeding. She was on OCPs last yr for a few months for birth control but they were stopped due to new HTN dx.  Sex activity: not sexually active. She does not have vaginal dryness. She has noticed a mass vaginally the last 6 months. Sx come and go. No pain, no urin or bowel issues.  Last Pap: Oct 18, 2014  Results were: no abnormalities /neg HPV DNA.  Hx of STDs: none  Last mammogram: May 19, 2016  Results were: cat 3 for probably benign RT breast mass. Repeat mammo/u/s due 6/18. There is no FH of breast cancer. There is a FH of ovarian cancer in her sister, pt is MyRisk neg. The patient does do self-breast exams.  Colonoscopy: colonoscopy 2 years ago without abnormalities. She is due for repeat in 5 yrs due to [place] of polyps.   Tobacco use: The patient denies current or previous tobacco use. Alcohol use: none Exercise: moderately active  She does get adequate calcium and Vitamin D in her diet.  We are managing her HTN. Her BP readings have been good per pt report. No side effects with losartan. ACEI cough resolved. Normal CMP 10/17   Past Medical History:  Diagnosis Date  . Eczema   . Family history of ovarian cancer 10/18/2014   MyRisk neg; sister  . 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 Hemorrhoids 2016   REM  by DR. BYRNETT c colonoscopy  . History of mammogram 10/18/14; 11/05/15   scattered fibroglandular densities, saw surgeon; incomplete-add views - stable;  . History of Papanicolaou smear of cervix  10/18/2014   -/-  . Hypertension   . Psoriasis   . Shingles 09/2015    Past Surgical History:  Procedure Laterality Date  . BREAST BIOPSY Left 10/2014   negative  . COLONOSCOPY N/A 11/20/2014   Procedure: COLONOSCOPY;  Surgeon: Robert Bellow, MD;  Location: [facility] ENDOSCOPY;  Service: Endoscopy;  Laterality: N/A;  . HEMORRHOID SURGERY N/A 11/20/2014   Procedure: HEMORRHOIDECTOMY;  Surgeon: Robert Bellow, MD;  Location: [facility] ORS;  Service: General;  Laterality: N/A;    Family History  Problem Relation Age of Onset  . Hypertension Mother 65  . Colonic polyp Mother   . Diabetes Father 45       TYPE 2  . Hypertension Father 73  . Heart attack Father   . Cancer Father 50       SKIN CA IN SITU  . Cancer Sister 49       ovarian, CERVICAL  . Heart attack Brother   . Hypertension Brother     Social History   Social History  . Marital status: Widowed    Spouse name: N/A  . Number of children: 2  . Years of education: 14   Occupational History  . LEGAL SECRETARY    Social History Main Topics  . Smoking status: Never Smoker  . Smokeless tobacco: Never Used  . Alcohol use No  .  Drug use: No  . Sexual activity: Not Currently    Birth control/ protection: None   Other Topics Concern  . Not on file   Social History Narrative  . No narrative on file     Current Outpatient Prescriptions:  .  losartan (COZAAR) 25 MG tablet, Take 1 tablet (25 mg total) by mouth daily., Disp: 90 tablet, Rfl: 3 .  Multiple Vitamin (MULTIVITAMIN) capsule, Take 1 capsule by mouth daily., Disp: , Rfl:  .  Omega-3 Fatty Acids (FISH OIL PO), Take 1 capsule by mouth daily., Disp: , Rfl:  .  OVER THE COUNTER MEDICATION, Bone nutrient, Disp: , Rfl:  .  Vitamin D, Ergocalciferol, (DRISDOL) 50000 units CAPS capsule, Take 1 capsule (50,000 Units total) by mouth every 7 (seven) days., Disp: 12 capsule, Rfl: 10   ROS:  Review of Systems  Constitutional: Negative for fatigue, fever and  unexpected weight change.  Respiratory: Negative for cough, shortness of breath and wheezing.   Cardiovascular: Negative for chest pain, palpitations and leg swelling.  Gastrointestinal: Negative for blood in stool, constipation, diarrhea, nausea and vomiting.  Endocrine: Negative for cold intolerance, heat intolerance and polyuria.  Genitourinary: Negative for dyspareunia, dysuria, flank pain, frequency, genital sores, hematuria, menstrual problem, pelvic pain, urgency, vaginal bleeding, vaginal discharge and vaginal pain.  Musculoskeletal: Negative for back pain, joint swelling and myalgias.  Skin: Negative for rash.  Neurological: Negative for dizziness, syncope, light-headedness, numbness and headaches.  Hematological: Negative for adenopathy.  Psychiatric/Behavioral: Negative for agitation, confusion, sleep disturbance and suicidal ideas. The patient is not nervous/anxious.      Objective: BP 124/78 (BP Location: Left Arm, Patient Position: Sitting, Cuff Size: Normal)   Pulse 89   Ht _0  (1.651 m)   Wt 171 lb (77.6 kg)   LMP 12/06/2016   BMI 28.46 kg/m    Physical Exam  Constitutional: She is oriented to person, place, and time. She appears well-developed and well-nourished.  Genitourinary: Vagina normal and uterus normal. There is no rash or tenderness on the right labia. There is no rash or tenderness on the left labia. No erythema or tenderness in the vagina. No vaginal discharge found. Right adnexum does not display mass and does not display tenderness. Left adnexum does not display mass and does not display tenderness. Cervix does not exhibit motion tenderness or polyp. Uterus is not enlarged or tender.  Genitourinary Comments: GRADE 1-2 UTERINE PROLAPSE, MILD CYSTOCELE  Neck: Normal range of motion. No thyromegaly present.  Cardiovascular: Normal rate, regular rhythm and normal heart sounds.   No murmur heard. Pulmonary/Chest: Effort normal and breath sounds normal. Right  breast exhibits no mass, no nipple discharge, no skin change and no tenderness. Left breast exhibits no mass, no nipple discharge, no skin change and no tenderness.  Abdominal: Soft. There is no tenderness. There is no guarding.  Musculoskeletal: Normal range of motion.  Neurological: She is alert and oriented to person, place, and time. No cranial nerve deficit.  Psychiatric: She has a normal mood and affect. Her behavior is normal.  Vitals reviewed.    Assessment/Plan:  Encounter for annual routine gynecological examination  Screening for breast cancer - Plan: MM DIAG BREAST TOMO BILATERAL, US BREAST LTD UNI RIGHT INC AXILLA  Abnormal mammogram - Pt due for cat 3 f/u mammo and will sched [facility]. - Plan: MM DIAG BREAST TOMO BILATERAL, US BREAST LTD UNI RIGHT INC AXILLA  Essential hypertension - Controlled with losartan. Rx RF to optum. F/u  in 70yrsooner prn elevated levels. - Plan: losartan (COZAAR) 25 MG tablet  Uterine prolapse - Grade 1-2. Discussed PT vs surgery. Pt wants to try PT first. [facility]  - Plan: Ambulatory referral to Physical Therapy   Meds ordered this encounter  Medications  . losartan (COZAAR) 25 MG tablet    Sig: Take 1 tablet (25 mg total) by mouth daily.    Dispense:  90 tablet    Refill:  3    ANNUAL DUE 7/18            GYN counsel mammography screening, adequate intake of calcium and vitamin D    F/U  Return in about 1 year (around 12/16/2017).  Amber Teague B. Chayanne Filippi, PA-C 12/16/2016 4:09 PM

## 2017-01-05 LAB — LIPID PANEL
CHOLESTEROL: 154 (ref 0–200)
HDL: 49 (ref 35–70)
LDL Cholesterol: 84
TRIGLYCERIDES: 107 (ref 40–160)

## 2017-01-05 LAB — BASIC METABOLIC PANEL
Creatinine: 0.9 (ref 0.5–1.1)
GLUCOSE: 81

## 2017-01-05 LAB — HEMOGLOBIN A1C: Hemoglobin A1C: 5

## 2017-01-20 ENCOUNTER — Ambulatory Visit
Admission: RE | Admit: 2017-01-20 | Discharge: 2017-01-20 | Disposition: A | Discharge status: Home / Self Care | Payer: 59 | Source: Ambulatory Visit | Attending: Obstetrics and Gynecology | Admitting: Obstetrics and Gynecology

## 2017-01-20 DIAGNOSIS — N6310 Unspecified lump in the right breast, unspecified quadrant: Secondary | ICD-10-CM | POA: Insufficient documentation

## 2017-01-20 DIAGNOSIS — Z1239 Encounter for other screening for malignant neoplasm of breast: Secondary | ICD-10-CM

## 2017-01-20 DIAGNOSIS — R928 Other abnormal and inconclusive findings on diagnostic imaging of breast: Secondary | ICD-10-CM

## 2017-01-20 DIAGNOSIS — N6311 Unspecified lump in the right breast, upper outer quadrant: Secondary | ICD-10-CM | POA: Insufficient documentation

## 2017-01-25 ENCOUNTER — Telehealth: Payer: Self-pay | Admitting: Obstetrics and Gynecology

## 2017-01-25 DIAGNOSIS — R928 Other abnormal and inconclusive findings on diagnostic imaging of breast: Secondary | ICD-10-CM

## 2017-01-25 NOTE — Telephone Encounter (Signed)
LM with stable RT breast cyts/cat 3 mammo results. Recommended 6 mo f/u. Order in computer. F/u sooner prn.

## 2017-02-09 ENCOUNTER — Telehealth: Payer: Self-pay | Admitting: Family Medicine

## 2017-02-09 NOTE — Telephone Encounter (Signed)
Patient cld states woke up with  Pink eye again ( was here in June for same thing) -- Pt request Rx be called into [facility] in [facility]).--pls call her if questions/[phone]. --glh

## 2017-02-10 NOTE — Telephone Encounter (Signed)
Advised pt that she would need OV for evaluation and treatment.  Pt expressed understanding and is agreeable.  Charyl Bigger, CMA

## 2017-02-22 ENCOUNTER — Ambulatory Visit: Payer: 59 | Attending: Obstetrics and Gynecology | Admitting: Physical Therapy

## 2017-02-22 DIAGNOSIS — R279 Unspecified lack of coordination: Secondary | ICD-10-CM | POA: Insufficient documentation

## 2017-02-22 DIAGNOSIS — G8929 Other chronic pain: Secondary | ICD-10-CM | POA: Diagnosis present

## 2017-02-22 DIAGNOSIS — M545 Low back pain: Secondary | ICD-10-CM | POA: Diagnosis present

## 2017-02-22 DIAGNOSIS — M6281 Muscle weakness (generalized): Secondary | ICD-10-CM

## 2017-02-22 DIAGNOSIS — R2689 Other abnormalities of gait and mobility: Secondary | ICD-10-CM | POA: Insufficient documentation

## 2017-02-22 NOTE — Patient Instructions (Addendum)
Deep core level 1 ( breathing)  10 reps   X 2 day    ________    ________   Work:  Work Designer, multimedia  (handout)   Designer, fashion/clothing with getting out of a chair to decrease strain  on back &pelvic floor   Avoid holding your breath when Getting out of the chair:  Scoot to front part of chair chair Heels behind feet, feet are hip width apart, nose over toes  Inhale like you are smelling roses Exhale to stand    _____  Stretches:  6 directions of spine 5 reps  2 x day    Heel raises with hand on wall 10 reps x 3   _____  Stationary bike 15 min  Post stretches   ____  [email or web address]  Flexaire type of shoe   Propioceptive insoles  [email or web address]

## 2017-02-23 NOTE — Therapy (Signed)
[facility] MAIN [facility] SERVICES [address] [place], [place], [address] Phone: [phone]   Fax:  [phone]  Physical Therapy Evaluation  Patient Details  Name: Amber Henson MRN: [id number] Date of Birth: January 08, 1961 Referring Provider: Helmut Muster Copland   Encounter Date: 02/22/2017      PT End of Session - 02/22/17 0909    Visit Number 1   Number of Visits 12   Date for PT Re-Evaluation 05/17/17   PT Start Time 0810   PT Stop Time 0909   PT Time Calculation (min) 59 min   Activity Tolerance Patient tolerated treatment well;No increased pain   Behavior During Therapy WFL for tasks assessed/performed      Past Medical History:  Diagnosis Date  . Eczema   . Family history of ovarian cancer 10/18/2014   MyRisk neg; sister  . 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 Hemorrhoids 2016   REM  by DR. BYRNETT c colonoscopy  . History of mammogram 10/18/14; 11/05/15   scattered fibroglandular densities, saw surgeon; incomplete-add views - stable;  . History of Papanicolaou smear of cervix 10/18/2014   -/-  . Hypertension   . Psoriasis   . Shingles 09/2015    Past Surgical History:  Procedure Laterality Date  . BREAST BIOPSY Left 10/2014   negative core  . COLONOSCOPY N/A 11/20/2014   Procedure: COLONOSCOPY;  Surgeon: Earline Mayotte, MD;  Location: [facility] ENDOSCOPY;  Service: Endoscopy;  Laterality: N/A;  . HEMORRHOID SURGERY N/A 11/20/2014   Procedure: HEMORRHOIDECTOMY;  Surgeon: Earline Mayotte, MD;  Location: [facility] ORS;  Service: General;  Laterality: N/A;    There were no vitals filed for this visit.       Subjective Assessment - 02/22/17 0820    Subjective 1) Pt has experienced her Sx related to uterine prolapse for the past 6 months.  Excessive exercise [facility])  and straining cause the Sx to be worse. Pt describes the sensation as "bulge" which she can push back in but it comes back out.   Hx 2  vaginal deliveries without perineal trauma, excessive pushing for one of the L& D.  Hemorrhoids occured with 2nd child and worsened over time. Hemorrhoid surgery 2016.  Pt has to push for bowel movements 50% of the time.  [place] Stool Type 3. Daily intake of fluid: 64 fl oz of water, Pt feels she is not incorporate as much fiber as she needs but she is on weight watchers and that program encourages fruit veggies. This program has not helped her to have better bowel movemetns.     2) CLBP in the lower back, 6/10  cleaning house, shopping, cleanin. At its worst 6/10. Average 1/10. Dull ache without radiating pain.  Denied sudden loss of control in B & B, senorey loss in perineal area.     3) mild discomfort/ "pressure" in the low abdomen occurs with more activities. Eases quickly.  Does not occur with eating nor bowel movements.    4) weak ankles which caused falls and consequent broken bones in both feet ( outer and big toes). Pt wore a boot two times. NO surgery. BOnes healed on their own  p   Pertinent History Previous boot camp classes 2x week, 3 months. Sitting at work all day but get frequently.  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    Patient Stated Goals to strengthen pelvic floor and get her back not as fragile  [facility] PT Assessment - 02/23/17 0856      Assessment   Medical Diagnosis uterine prolapse    Referring Provider Helmut Muster Copland      Precautions   Precautions None     Restrictions   Weight Bearing Restrictions No     Balance Screen   Has the patient fallen in the past 6 months No     Prior Function   Level of Independence Independent     Observation/Other Assessments   Observations slumped sitting , ankles crossed     Coordination   Gross Motor Movements are Fluid and Coordinated --  anterior expansion of ribcage   Fine Motor Movements are Fluid and Coordinated --  abdominal straining with pelvic floor cue      Single Leg Stance   Comments unable to balance without UE  support on B      Posture/Postural Control   Posture Comments chest breathing     ROM / Strength   AROM / PROM / Strength --  hypermobile , all spinal ROM within [facility]             Objective measurements completed on examination: See above findings.          [facility] Adult PT Treatment/Exercise - 02/23/17 0856      Neuro Re-ed    Neuro Re-ed Details  see pt instructions     Exercises   Exercises --  see pt instruction                PT Education - 02/22/17 0908    Education provided Yes   Education Details POC, anatomy, physiology, goals, HEP    Person(s) Educated Patient   Methods Explanation;Demonstration;Tactile cues;Verbal cues;Handout   Comprehension Verbalized understanding;Returned demonstration             PT Long Term Goals - 02/22/17 0913      PT LONG TERM GOAL #1   Title Pt will demo proper deep core coordination in order to minimize straining of pelvic floor mm and promote pelvic floro function   Time 2   Period Weeks   Status New     PT LONG TERM GOAL #2   Title Pt will be able to walk at the fair after 2 hours with minimal pain in order to participate in community events    Time 4   Period Weeks   Status New     PT LONG TERM GOAL #3   Title Pt will decrease her ODI score from 16% to < 9% in order return ADLs.    Time 12   Period Weeks   Status New     PT LONG TERM GOAL #4   Title Pt will decrease her PFDI score from 34% to < 29% in order to improve pelvic floor function    Time 12   Period Weeks   Status New     PT LONG TERM GOAL #5   Title Pt demo proper alignment and technique with fitness exercises to minimize strain on pelvic floor and minimize worsening of Sx   Time 8   Period Weeks   Status New     Additional Long Term Goals   Additional Long Term Goals Yes     PT LONG TERM GOAL #6   Title Pt will demo > 10 sec in SLS on BLE in order to improve balance and ankle stability for minimize risk for falls   Time 12    Period Weeks  Status New                Plan - 02/23/17 1035    Clinical Impression Statement Pt is a 56 yo female who presents with complaints of uterine-prolapse symptoms, low abdominal mm discomfort, CLBP, and weak ankles. These deficits impact her functional mobility and QOL. Pt's clinical presentations showed dyscoordination of deep core mm, limited knowledge of modifying her fitness routine to minimize straining her abdomen and pelvic floor, poor posture, and poor balance. Pt's vaginal deliveries, hemorrhoid surgery, and performance of certain exercises in boot camp classes, and sedentary job have contributed to her prolapse Sx and likely her low abdominal discomfort. Pt's Hx of wearing boots and bone Fx in B feet impact her weak ankle and poor balance. Plan to assess pelvic floor at next session.  Following Tx today, pt demo'd spinal mobility HEP and proper coordination with toileting to minimzie straining pelvic floor, and was provided education of proper sitting posture and ergonmic work Ecologist.      Rehab Potential Good   PT Frequency 1x / week   PT Duration 12 weeks   PT Treatment/Interventions Therapeutic activities;Functional mobility training;Patient/family education;Stair training;Therapeutic exercise;Balance training;Neuromuscular re-education;Moist Heat;Scar mobilization;Manual lymph drainage   Consulted and Agree with Plan of Care Patient      Patient will benefit from skilled therapeutic intervention in order to improve the following deficits and impairments:  Pain, Postural dysfunction, Increased muscle spasms, Decreased mobility, Decreased coordination, Decreased scar mobility, Improper body mechanics, Decreased endurance, Decreased range of motion, Impaired UE functional use, Decreased strength, Decreased balance, Decreased safety awareness, Increased edema  Visit Diagnosis: Muscle weakness (generalized)  Chronic midline low back pain without  sciatica  Other abnormalities of gait and mobility  Unspecified lack of coordination     Problem List Patient Active Problem List   Diagnosis Date Noted  . Conjunctivitis of left eye 11/01/2016  . Vitamin D deficiency 03/28/2016  . Hypertriglyceridemia 03/28/2016  . Hypertension 01/10/2016  . Mass of right breast on mammogram 01/10/2016  . Diffuse cystic mastopathy of both breasts (R>L) 01/10/2016  . Family history of diabetes mellitus-father 01/10/2016  . Family history of ovarian cancer- sister age 22 01/10/2016  . Family history of essential hypertension- in 95s and early 50s 01/10/2016  . Family history of early CAD 01/05/2016  . Overweight (BMI 25.0-29.9) 01/05/2016  . History of shingles 01/05/2016  . h/o Hemorrhoid- sx repair 6/16 11/01/2014    Jerl Mina ,PT, DPT, E-RYT   02/23/2017, 10:39 AM  [place] MAIN [facility] SERVICES [address] [place], [place], [address] Phone: [phone]   Fax:  [phone]  Name: Amber Henson MRN: [id number] Date of Birth: January 20, 1961

## 2017-02-23 NOTE — Therapy (Deleted)
[facility] MAIN [facility] SERVICES [address] [place], [place], [address] Phone: [phone]   Fax:  [phone]  Physical Therapy Evaluation  Patient Details  Name: Amber Henson MRN: [id number] Date of Birth: [birth year] Referring Provider: Helmut Muster Copland   Encounter Date: 02/22/2017      PT End of Session - 02/22/17 0909    Visit Number 1   Number of Visits 12   Date for PT Re-Evaluation 05/17/17   PT Start Time 0810   PT Stop Time 0909   PT Time Calculation (min) 59 min   Activity Tolerance Patient tolerated treatment well;No increased pain   Behavior During Therapy WFL for tasks assessed/performed      Past Medical History:  Diagnosis Date  . Eczema   . Family history of ovarian cancer 10/18/2014   MyRisk neg; sister  . 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 Hemorrhoids 2016   REM  by DR. BYRNETT c colonoscopy  . History of mammogram 10/18/14; 11/05/15   scattered fibroglandular densities, saw surgeon; incomplete-add views - stable;  . History of Papanicolaou smear of cervix 10/18/2014   -/-  . Hypertension   . Psoriasis   . Shingles 09/2015    Past Surgical History:  Procedure Laterality Date  . BREAST BIOPSY Left 10/2014   negative core  . COLONOSCOPY N/A 11/20/2014   Procedure: COLONOSCOPY;  Surgeon: Earline Mayotte, MD;  Location: [facility] ENDOSCOPY;  Service: Endoscopy;  Laterality: N/A;  . HEMORRHOID SURGERY N/A 11/20/2014   Procedure: HEMORRHOIDECTOMY;  Surgeon: Earline Mayotte, MD;  Location: [facility] ORS;  Service: General;  Laterality: N/A;    There were no vitals filed for this visit.       Subjective Assessment - 02/22/17 0820    Subjective 1) Pt has experienced her Sx related to uterine prolapse for the past 6 months.  Excessive exercise [facility])  and straining cause the Sx to be worse. Pt describes the sensation as "bulge" which she can push back in but it comes back out.   Hx 2  vaginal deliveries without perineal trauma, excessive pushing for one of the L& D.  Hemorrhoids occured with 2nd child and worsened over time. Hemorrhoid surgery 2016.  Pt has to push for bowel movements 50% of the time.  [place] Stool Type 3. Daily intake of fluid: 64 fl oz of water, Pt feels she is not incorporate as much fiber as she needs but she is on weight watchers and that program encourages fruit veggies. This program has not helped her to have better bowel movemetns.       2) CLBP in the lower back, 6/10  cleaning house, shopping, cleanin. At its worst 6/10. Average 1/10. Dull ache without radiating pain.  Denied sudden loss of control in B & B, senorey loss in perineal area.       3) mild discomfort/ "pressure" in the low abdomen occurs with more activities. Eases quickly.  Does not occur with eating nor bowel movements.      4) weak ankles which caused falls and consequent broken bones in both feet ( outer and big toes). Pt wore a boot two times and did not undergo surgery. Bones healed on their own     Pertinent History Previous boot camp classes 2x week, 3 months. Sitting at work all day but get frequently.  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    Patient Stated Goals to strengthen  pelvic floor and get her back not as fragile             [facility] PT Assessment - 02/23/17 0856      Assessment   Medical Diagnosis uterine prolapse    Referring Provider Elmo Putt Copland      Precautions   Precautions None     Restrictions   Weight Bearing Restrictions No     Balance Screen   Has the patient fallen in the past 6 months No     Prior Function   Level of Independence Independent     Observation/Other Assessments   Observations slumped sitting , ankles crossed     Coordination   Gross Motor Movements are Fluid and Coordinated --  anterior expansion of ribcage   Fine Motor Movements are Fluid and Coordinated --  abdominal straining with pelvic floor cue      Single Leg Stance   Comments  unable to balance without UE support on B      Posture/Postural Control   Posture Comments chest breathing     ROM / Strength   AROM / PROM / Strength --  hypermobile , all spinal ROM within [facility]             Objective measurements completed on examination: See above findings.          [facility] Adult PT Treatment/Exercise - 02/23/17 0856      Neuro Re-ed    Neuro Re-ed Details  see pt instructions     Exercises   Exercises --  see pt instruction                PT Education - 02/22/17 0908    Education provided Yes   Education Details POC, anatomy, physiology, goals, HEP    Person(s) Educated Patient   Methods Explanation;Demonstration;Tactile cues;Verbal cues;Handout   Comprehension Verbalized understanding;Returned demonstration             PT Long Term Goals - 02/22/17 0913      PT LONG TERM GOAL #1   Title Pt will demo proper deep core coordination in order to minimize straining of pelvic floor mm and promote pelvic floro function   Time 2   Period Weeks   Status New     PT LONG TERM GOAL #2   Title Pt will be able to walk at the fair after 2 hours with minimal pain in order to participate in community events    Time 4   Period Weeks   Status New     PT LONG TERM GOAL #3   Title Pt will decrease her ODI score from 16% to < 9% in order return ADLs.    Time 12   Period Weeks   Status New     PT LONG TERM GOAL #4   Title Pt will decrease her PFDI score from 34% to < 29% in order to improve pelvic floor function    Time 12   Period Weeks   Status New     PT LONG TERM GOAL #5   Title Pt demo proper alignment and technique with fitness exercises to minimize strain on pelvic floor and minimize worsening of Sx   Time 8   Period Weeks   Status New     Additional Long Term Goals   Additional Long Term Goals Yes     PT LONG TERM GOAL #6   Title Pt will demo > 10 sec in SLS on BLE in  order to improve balance and ankle stability for  minimize risk for falls   Time 12   Period Weeks   Status New                Plan - 02/23/17 0854    Clinical Impression Statement Pt is a 56 yo female who presents with complaints of uterine-prolapse symptoms, low abdominal mm discomfort, CLBP, and weak ankles. These deficits impact her functional mobility and QOL. Pt's clinical presentations showed dyscoordination of deep core mm, limited knowledge of modifying her fitness routine to minimize straining her abdomen and pelvic floor, poor posture, and poor balance.       Patient will benefit from skilled therapeutic intervention in order to improve the following deficits and impairments:  Pain, Postural dysfunction, Increased muscle spasms, Decreased mobility, Decreased coordination, Decreased scar mobility, Improper body mechanics, Decreased endurance, Decreased range of motion, Impaired UE functional use, Decreased strength, Decreased balance, Decreased safety awareness, Increased edema  Visit Diagnosis: Muscle weakness (generalized)  Chronic midline low back pain without sciatica  Other abnormalities of gait and mobility  Unspecified lack of coordination     Problem List Patient Active Problem List   Diagnosis Date Noted  . Conjunctivitis of left eye 11/01/2016  . Vitamin D deficiency 03/28/2016  . Hypertriglyceridemia 03/28/2016  . Hypertension 01/10/2016  . Mass of right breast on mammogram 01/10/2016  . Diffuse cystic mastopathy of both breasts (R>L) 01/10/2016  . Family history of diabetes mellitus-father 01/10/2016  . Family history of ovarian cancer- sister age 69 01/10/2016  . Family history of essential hypertension- in 89s and early 50s 01/10/2016  . Family history of early CAD 01/05/2016  . Overweight (BMI 25.0-29.9) 01/05/2016  . History of shingles 01/05/2016  . h/o Hemorrhoid- sx repair 6/16 11/01/2014    Jerl Mina 02/23/2017, 10:39 AM  [place] MAIN  [facility] SERVICES [address] [place], [place], [address] Phone: [phone]   Fax:  [phone]  Name: Amber Henson MRN: [id number] Date of Birth: December 26, 1960

## 2017-03-01 ENCOUNTER — Ambulatory Visit: Payer: 59 | Admitting: Physical Therapy

## 2017-03-08 ENCOUNTER — Ambulatory Visit: Payer: 59 | Admitting: Physical Therapy

## 2017-03-08 DIAGNOSIS — R2689 Other abnormalities of gait and mobility: Secondary | ICD-10-CM

## 2017-03-08 DIAGNOSIS — M545 Low back pain: Secondary | ICD-10-CM

## 2017-03-08 DIAGNOSIS — R279 Unspecified lack of coordination: Secondary | ICD-10-CM

## 2017-03-08 DIAGNOSIS — M6281 Muscle weakness (generalized): Secondary | ICD-10-CM

## 2017-03-08 DIAGNOSIS — G8929 Other chronic pain: Secondary | ICD-10-CM

## 2017-03-08 NOTE — Therapy (Addendum)
[place] MAIN [facility] SERVICES [address] [place], [place], [address] Phone: [phone]   Fax:  [phone]  Physical Therapy Treatment  Patient Details  Name: Amber Henson MRN: [id number] Date of Birth: 1961-02-07 Referring Provider: Elmo Putt Copland   Encounter Date: 03/08/2017      PT End of Session - 03/08/17 0849    Visit Number 2   Number of Visits 12   Date for PT Re-Evaluation 05/17/17   PT Start Time 0805   PT Stop Time 0850   PT Time Calculation (min) 45 min   Activity Tolerance Patient tolerated treatment well;No increased pain   Behavior During Therapy WFL for tasks assessed/performed      Past Medical History:  Diagnosis Date  . Eczema   . Family history of ovarian cancer 10/18/2014   MyRisk neg; sister  . 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 Hemorrhoids 2016   REM  by DR. BYRNETT c colonoscopy  . History of mammogram 10/18/14; 11/05/15   scattered fibroglandular densities, saw surgeon; incomplete-add views - stable;  . History of Papanicolaou smear of cervix 10/18/2014   -/-  . Hypertension   . Psoriasis   . Shingles 09/2015    Past Surgical History:  Procedure Laterality Date  . BREAST BIOPSY Left 10/2014   negative core  . COLONOSCOPY N/A 11/20/2014   Procedure: COLONOSCOPY;  Surgeon: Robert Bellow, MD;  Location: [facility] ENDOSCOPY;  Service: Endoscopy;  Laterality: N/A;  . HEMORRHOID SURGERY N/A 11/20/2014   Procedure: HEMORRHOIDECTOMY;  Surgeon: Robert Bellow, MD;  Location: [facility] ORS;  Service: General;  Laterality: N/A;    There were no vitals filed for this visit.      Subjective Assessment - 03/08/17 0811    Subjective Pt reports her LBP has gotten better by 20% since last session    Pertinent History Previous boot camp classes 2x week, 3 months. Sitting at work all day but get frequently.  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    Patient Stated Goals to strengthen pelvic floor and  get her back not as fragile             [facility] PT Assessment - 03/08/17 0840      PROM   Overall PROM Comments supination  with laxity B at ankles      Strength   Overall Strength Comments L ankle eversion 2/5, 4-/5 R      Ambulation/Gait   Gait Comments genu varus B,  decreased L stance phase                   Pelvic Floor Special Questions - 03/08/17 6962    Prolapse Posterior Wall  outside introitus with cue for cough   Pelvic Floor Internal Exam pt was explained details to exam, pt consented verbally without contraindications    Exam Type Vaginal   Palpation tenderness at iliococcgyeus L ( decreased post Tx)    Strength weak squeeze, no lift  post Tx: 3/5 , pillow under hips, cued for feet co-activatio   Strength # of reps 3   Strength # of seconds 5           [facility] Adult PT Treatment/Exercise - 03/08/17 0848      Neuro Re-ed    Neuro Re-ed Details  see pt instructions     Exercises   Exercises --  see pt instruction     Manual Therapy  Manual therapy comments intravaginal assessment                 PT Education - 03/08/17 0849    Education provided Yes   Education Details HEP   Person(s) Educated Patient   Methods Explanation;Demonstration;Tactile cues;Verbal cues;Handout   Comprehension Returned demonstration;Verbalized understanding             PT Long Term Goals - 02/22/17 0913      PT LONG TERM GOAL #1   Title Pt will demo proper deep core coordination in order to minimize straining of pelvic floor mm and promote pelvic floro function   Time 2   Period Weeks   Status New     PT LONG TERM GOAL #2   Title Pt will be able to walk at the fair after 2 hours with minimal pain in order to participate in community events    Time 4   Period Weeks   Status New     PT LONG TERM GOAL #3   Title Pt will decrease her ODI score from 16% to < 9% in order return ADLs.    Time 12   Period Weeks   Status New     PT LONG TERM  GOAL #4   Title Pt will decrease her PFDI score from 34% to < 29% in order to improve pelvic floor function    Time 12   Period Weeks   Status New     PT LONG TERM GOAL #5   Title Pt demo proper alignment and technique with fitness exercises to minimize strain on pelvic floor and minimize worsening of Sx   Time 8   Period Weeks   Status New     Additional Long Term Goals   Additional Long Term Goals Yes     PT LONG TERM GOAL #6   Title Pt will demo > 10 sec in SLS on BLE in order to improve balance and ankle stability for minimize risk for falls   Time 12   Period Weeks   Status New               Plan - 03/08/17 0850    Clinical Impression Statement Pt showed descent of pelvic organ outside of introitus with cue to cough. Pt showed more cranial position post Tx which included visual and tactil cues. Pt also decreased tenderness in L iliococcgyeus with pelvic tilt and also increased pelvic floor contraction strength with cues to co-activate the feet in proper alignment. Added L ankle eversoin strengthening exercise due to weakness compared to R.  Pt demo'd less lowering of pelvic organ post Tx when asked to cough.  Since last session, pt states her low back pain has improved and is working on better  posture.  Pt continues to benefit from skilled PT.    Rehab Potential Good   PT Frequency 1x / week   PT Duration 12 weeks   PT Treatment/Interventions Therapeutic activities;Functional mobility training;Patient/family education;Stair training;Therapeutic exercise;Balance training;Neuromuscular re-education;Moist Heat;Scar mobilization;Manual lymph drainage   Consulted and Agree with Plan of Care Patient      Patient will benefit from skilled therapeutic intervention in order to improve the following deficits and impairments:  Pain, Postural dysfunction, Increased muscle spasms, Decreased mobility, Decreased coordination, Decreased scar mobility, Improper body mechanics, Decreased  endurance, Decreased range of motion, Impaired UE functional use, Decreased strength, Decreased balance, Decreased safety awareness, Increased edema  Visit Diagnosis: Muscle weakness (generalized)  Chronic midline low back  pain without sciatica  Other abnormalities of gait and mobility  Unspecified lack of coordination     Problem List Patient Active Problem List   Diagnosis Date Noted  . Conjunctivitis of left eye 11/01/2016  . Vitamin D deficiency 03/28/2016  . Hypertriglyceridemia 03/28/2016  . Hypertension 01/10/2016  . Mass of right breast on mammogram 01/10/2016  . Diffuse cystic mastopathy of both breasts (R>L) 01/10/2016  . Family history of diabetes mellitus-father 01/10/2016  . Family history of ovarian cancer- sister age 48 01/10/2016  . Family history of essential hypertension- in 73s and early 50s 01/10/2016  . Family history of early CAD 01/05/2016  . Overweight (BMI 25.0-29.9) 01/05/2016  . History of shingles 01/05/2016  . h/o Hemorrhoid- sx repair 6/16 11/01/2014    Jerl Mina ,PT, DPT, E-RYT  03/08/2017, 9:09 AM  [place] MAIN [facility] SERVICES [address] [place], [place], [address] Phone: [phone]   Fax:  [phone]  Name: MERRISSA GIACOBBE MRN: [id number] Date of Birth: [birth year]

## 2017-03-08 NOTE — Patient Instructions (Signed)
Deep core level 2 ( pillow under hips)    PELVIC FLOOR / KEGEL EXERCISES   Pelvic floor/ Kegel exercises are used to strengthen the muscles in the base of your pelvis that are responsible for supporting your pelvic organs and preventing urine/feces leakage. Based on your therapist's recommendations, they can be performed while standing, sitting, or lying down. Imagine pelvic floor area as a diamond with pelvic landmarks: top =pubic bone, bottom tip=tailbone, sides=sitting bones (ischial tuberosities).    Make yourself aware of this muscle group by using these cues while coordinating your breath:  Inhale, feel pelvic floor diamond area lower like hammock towards your feet and ribcage/belly expanding. Pause. Let the exhale naturally and feel your belly sink, abdominal muscles hugging in around you and you may notice the pelvic diamond draws upward towards your head forming a umbrella shape. Give a squeeze during the exhalation like you are stopping the flow of urine. If you are squeezing the buttock muscles, try to give 50% less effort.   Common Errors:  Breath holding: If you are holding your breath, you may be bearing down against your bladder instead of pulling it up. If you belly bulges up while you are squeezing, you are holding your breath. Be sure to breathe gently in and out while exercising. Counting out loud may help you avoid holding your breath.  Accessory muscle use: You should not see or feel other muscle movement when performing pelvic floor exercises. When done properly, no one can tell that you are performing the exercises. Keep the buttocks, belly and inner thighs relaxed.  Overdoing it: Your muscles can fatigue and stop working for you if you over-exercise. You may actually leak more or feel soreness at the lower abdomen or rectum.  YOUR HOME EXERCISE PROGRAM  LONG HOLDS: Position: on back WITH PILLOW UNDER HIPS   Inhale and then exhale. Then squeeze the muscle and count  aloud for 5  seconds. Rest with three long breaths. (Be sure to let belly sink in with exhales and not push outward)   PRESS FOUR CORNERS OF FEET DOWN< KNEES ALIGNED WITH HIPS.   Perform 3 repetitions, 3 times/day                      DECREASE DOWNWARD PRESSURE ON  YOUR PELVIC FLOOR, ABDOMINAL, LOW BACK MUSCLES       PRESERVE YOUR PELVIC HEALTH LONG-TERM   ** SQUEEZE pelvic floor BEFORE YOUR SNEEZE, COUGH, LAUGH   ** EXHALE BEFORE YOU RISE AGAINST GRAVITY (lifting, sit to stand, from squat to stand)   ** LOG ROLL OUT OF BED INSTEAD OF CRUNCH/SIT-UP

## 2017-03-09 ENCOUNTER — Encounter: Payer: Self-pay | Admitting: Physical Therapy

## 2017-03-09 DIAGNOSIS — M545 Low back pain: Secondary | ICD-10-CM

## 2017-03-09 DIAGNOSIS — M6281 Muscle weakness (generalized): Secondary | ICD-10-CM

## 2017-03-09 DIAGNOSIS — R2689 Other abnormalities of gait and mobility: Secondary | ICD-10-CM

## 2017-03-09 DIAGNOSIS — R279 Unspecified lack of coordination: Secondary | ICD-10-CM

## 2017-03-09 DIAGNOSIS — G8929 Other chronic pain: Secondary | ICD-10-CM

## 2017-03-09 NOTE — Therapy (Signed)
[place] MAIN [facility] SERVICES [address] [place], [place], [address] Phone: [phone]   Fax:  [phone]  Patient Details  Name: Amber Henson MRN: [id number] Date of Birth: May 31, 1960 Referring Provider:  Ardeth Perfect, PA   Encounter Date: 03/09/2017   Discharge Summary    Pt is self-discharging due to limited coverage by her insurance. Pt completed 2 visits including evaluation and has achieved 2/6 goals.   At her 2nd visit, pt showed descent of pelvic organ outside of introitus with cue to cough. Pt showed more cranial position post Tx which included visual and tactil cues. Pt also decreased tenderness in L iliococcgyeus with pelvic tilt and also increased pelvic floor contraction strength with cues to co-activate the feet in proper alignment. Added L ankle eversoin strengthening exercise due to weakness compared to R.  Pt demo'd less lowering of pelvic organ post Tx when asked to cough.  Since last session, pt states her low back pain has improved and is working on better  posture.   Thank you for your referral!           PT Long Term Goals - 02/22/17 0913      PT LONG TERM GOAL #1   Title Pt will demo proper deep core coordination in order to minimize straining of pelvic floor mm and promote pelvic floor function   Time 2   Period Weeks   Status Achieved     PT LONG TERM GOAL #2   Title Pt will be able to walk at the fair after 2 hours with minimal pain in order to participate in community events    Time 4   Period Weeks   Status Unable to reassess     PT LONG TERM GOAL #3   Title Pt will decrease her ODI score from 16% to < 9% in order return ADLs.    Time 12   Period Weeks   Status Unable to reassess     PT LONG TERM GOAL #4   Title Pt will decrease her PFDI score from 34% to < 29% in order to improve pelvic floor function    Time 12   Period Weeks   Status Achieved     PT LONG TERM GOAL #5   Title Pt demo proper  alignment and technique with fitness exercises to minimize strain on pelvic floor and minimize worsening of Sx   Time 8   Period Weeks   Status Partially met      Additional Long Term Goals   Additional Long Term Goals Yes     PT LONG TERM GOAL #6   Title Pt will demo > 10 sec in SLS on BLE in order to improve balance and ankle stability for minimize risk for falls   Time 12   Period Weeks   Status Ongoing         Jerl Mina ,PT, DPT, E-RYT  03/09/2017, 3:39 PM  [place] [facility] MAIN [facility] SERVICES [address] [place], [place], [address] Phone: [phone]   Fax:  [phone]

## 2017-03-22 ENCOUNTER — Ambulatory Visit: Payer: 59 | Admitting: Physical Therapy

## 2017-04-05 ENCOUNTER — Ambulatory Visit: Payer: 59 | Admitting: Physical Therapy

## 2017-04-11 ENCOUNTER — Telehealth: Payer: Self-pay

## 2017-04-11 NOTE — Telephone Encounter (Signed)
Pt states she and [facility] have been discussing bc.  Needs to discuss that c [facility] to get rs placed for that.  Please call back when rx is sent in or if you need more info.  [phone]

## 2017-04-12 ENCOUNTER — Ambulatory Visit: Payer: 59 | Admitting: Family Medicine

## 2017-04-12 MED ORDER — NORETHINDRONE 0.35 MG PO TABS: 1.0000 | ORAL_TABLET | Freq: Every day | ORAL | 8 refills | Status: DC

## 2017-04-12 NOTE — Telephone Encounter (Addendum)
Pt is sex active and having monthly menses. Conception unlikely at her age but she would rather be safe. Has HTN. Will try camila OCPs. POP options discussed. Pt to use condoms/abstinence for 1 wk and start OCP today. LMP 04/04/17 (no sex activity since LMP). F/u prn.

## 2017-04-19 ENCOUNTER — Encounter: Payer: 59 | Admitting: Physical Therapy

## 2017-05-23 ENCOUNTER — Ambulatory Visit: Payer: 59 | Admitting: Family Medicine

## 2017-06-09 ENCOUNTER — Ambulatory Visit (INDEPENDENT_AMBULATORY_CARE_PROVIDER_SITE_OTHER): Payer: BLUE CROSS/BLUE SHIELD | Admitting: Family Medicine

## 2017-06-09 ENCOUNTER — Encounter: Payer: Self-pay | Admitting: Family Medicine

## 2017-06-09 VITALS — BP 125/88 | HR 68 | Ht 65.0 in | Wt 176.3 lb

## 2017-06-09 DIAGNOSIS — E781 Pure hyperglyceridemia: Secondary | ICD-10-CM

## 2017-06-09 DIAGNOSIS — I10 Essential (primary) hypertension: Secondary | ICD-10-CM | POA: Diagnosis not present

## 2017-06-09 DIAGNOSIS — E559 Vitamin D deficiency, unspecified: Secondary | ICD-10-CM

## 2017-06-09 DIAGNOSIS — M6289 Other specified disorders of muscle: Secondary | ICD-10-CM | POA: Insufficient documentation

## 2017-06-09 DIAGNOSIS — E663 Overweight: Secondary | ICD-10-CM

## 2017-06-09 NOTE — Patient Instructions (Addendum)
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  We will look at the labs and then determine if any additional need to be done and bring her back at her convenience to come in for those labs.  Otherwise we will see her in 6 months    Preventive Care for Adults  A healthy lifestyle and preventive care can promote health and wellness. Preventive health guidelines for men include the following key practices:  .   A routine yearly physical is a good way to check with your health care provider about your health and preventative screening. It is a chance to share any concerns and updates on your health and to receive a thorough exam.  .  Visit your dentist for a routine exam and preventative care every 6 months. Brush your teeth twice a day and floss once a day. Good oral hygiene prevents tooth decay and gum disease.  .  The frequency of eye exams is based on your age, health, family medical history, use of contact lenses, and other factors.  Follow your health care provider's recommendations for frequency of eye exams.  .  Eat a healthy diet.  Foods such as vegetables, fruits, whole grains, low-fat dairy products, and lean protein foods contain the nutrients you need without too many calories.  Decrease your intake of foods high in solid fats, added sugars, and salt.  Eat the right amount of calories for you.  Get information about a proper diet from your health care provider, if necessary.  .  Regular physical exercise is one of the most important things you can do for your health.  Most adults should get at least 150 minutes of moderate-intensity exercise (any activity that increases your heart rate and causes you to sweat) each week.  In addition, most adults need muscle-strengthening exercises on 2 or more days a week.  .  Maintain a healthy weight. The body mass index (BMI) is a screening tool to identify possible weight  problems. It provides an estimate of body fat based on height and weight. Your health care provider can find your BMI and can help you achieve or maintain a healthy weight. For adults 20 years and older: A BMI below 18.5 is considered underweight. A BMI of 18.5 to 24.9 is normal. A BMI of 25 to 29.9 is considered overweight. A BMI of 30 and above is considered obese.  .  Maintain normal blood lipids and cholesterol levels by exercising and minimizing your intake of saturated fat. Eat a balanced diet with plenty of fruit and vegetables. Blood tests for lipids and cholesterol should begin at age 41 and be repeated every 5 years. If your lipid or cholesterol levels are high, you are over 50, or you are at high risk for heart disease, you may need your cholesterol levels checked more frequently. Ongoing high lipid and cholesterol levels should be treated with medicines if diet and exercise are not working.  .  If you smoke, find out from your health care provider how to quit. If you do not use tobacco, do not start.  . If you choose to drink alcohol, do not have more than 2 drinks per day. One drink is considered to be 12 ounces (355 mL) of beer, 5 ounces (148 mL) of wine, or 1.5 ounces (44 mL) of liquor.  Marland Kitchen Avoid use of street drugs. Do not share needles with anyone.  Ask for help if you need support or instructions about stopping the use of drugs.  . High blood pressure causes heart disease and increases the risk of stroke. Your blood pressure should be checked at least every 1-2 years. Ongoing high blood pressure should be treated with medicines, if weight loss and exercise are not effective.  . If you are 52-45 years old, ask your health care provider if you should take aspirin to prevent heart disease.  . Diabetes screening involves taking a blood sample to check your fasting blood sugar level.  This should be done once every 3 years, after age 65, if you are within normal weight and without  risk factors for diabetes.  Testing should be considered at a younger age or be carried out more frequently if you are overweight and have at least 1 risk factor for diabetes.  . Colorectal cancer can be detected and often prevented. Most routine colorectal cancer screening begins at the age of 40 and continues through age 63. However, your health care provider may recommend screening at an earlier age if you have risk factors for colon cancer. On a yearly basis, your health care provider may provide home test kits to check for hidden blood in the stool. Use of a small camera at the end of a tube to directly examine the colon (sigmoidoscopy or colonoscopy) can detect the earliest forms of colorectal cancer. Talk to your health care provider about this at age 79, when routine screening begins. Direct exam of the colon should be repeated every 5-10 years through age 22, unless early forms of precancerous polyps or small growths are found.  .  Lung cancer screening is recommended for adults aged 29-80 years who are at high risk for developing lung cancer because of a history of smoking. A yearly low-dose CT scan of the lungs is recommended for people who have at least a 30-pack-year history of smoking and are a current smoker or have quit within the past 15 years. A pack year of smoking is smoking an average of 1 pack of cigarettes a day for 1 year (for example: 1 pack a day for 30 years or 2 packs a day for 15 years). Yearly screening should continue until the smoker has stopped smoking for at least 15 years. Yearly screening should be stopped for people who develop a health problem that would prevent them from having lung cancer treatment.  . Talk with your health care provider about prostate cancer screening.  . Testicular cancer screening is recommended for adult males. Screening includes self-exam and a health care provider exam. Consult with your health care provider about any symptoms you have or any  concerns you have about testicular cancer.  . Use sunscreen. Apply sunscreen liberally and repeatedly throughout the day. You should seek shade when your shadow is shorter than you. Protect yourself by wearing long sleeves, pants, a wide-brimmed hat, and sunglasses year round, whenever you are outdoors.  . Once a month, do a whole-body skin exam, using a mirror to look at the skin on your back. Tell your health care provider about new moles, moles that have irregular borders, moles that are larger than a pencil eraser, or moles that have changed in shape or color.    ++++++++++++++++++++++++++++++++++++++++++++++++++++++++++++++++++  Stay current with required vaccines (immunizations).  ? Influenza vaccine. All adults should be immunized every year.  ? Tetanus, diphtheria, and acellular pertussis (Td, Tdap) vaccine. An adult who has not previously received Tdap or  who does not know his vaccine status should receive 1 dose of Tdap. This initial dose should be followed by tetanus and diphtheria toxoids (Td) booster doses every 10 years. Adults with an unknown or incomplete history of completing a 3-dose immunization series with Td-containing vaccines should begin or complete a primary immunization series including a Tdap dose. Adults should receive a Td booster every 10 years.  ? Varicella vaccine. An adult without evidence of immunity to varicella should receive 2 doses or a second dose if he has previously received 1 dose.  ? Human papillomavirus (HPV) vaccine. Males aged 61-21 years who have not received the vaccine previously should receive the 3-dose series. Males aged 22-26 years may be immunized. Immunization is recommended through the age of 41 years for any female who has sex with males and did not get any or all doses earlier. Immunization is recommended for any person with an immunocompromised condition through the age of 9 years if he did not get any or all doses earlier. During the  3-dose series, the second dose should be obtained 4-8 weeks after the first dose. The third dose should be obtained 24 weeks after the first dose and 16 weeks after the second dose.  ? Zoster vaccine. One dose is recommended for adults aged 31 years or older unless certain conditions are present.   ? PREVNAR - Pneumococcal 13-valent conjugate (PCV13) vaccine. When indicated, a person who is uncertain of his immunization history and has no record of immunization should receive the PCV13 vaccine. An adult aged 10 years or older who has certain medical conditions and has not been previously immunized should receive 1 dose of PCV13 vaccine. This PCV13 should be followed with a dose of pneumococcal polysaccharide (PPSV23) vaccine. The PPSV23 vaccine dose should be obtained at least 8 weeks after the dose of PCV13 vaccine. An adult aged 55 years or older who has certain medical conditions and previously received 1 or more doses of PPSV23 vaccine should receive 1 dose of PCV13. The PCV13 vaccine dose should be obtained 1 or more years after the last PPSV23 vaccine dose.   ? PNEUMOVAX - Pneumococcal polysaccharide (PPSV23) vaccine. When PCV13 is also indicated, PCV13 should be obtained first. All adults aged 85 years and older should be immunized. An adult younger than age 60 years who has certain medical conditions should be immunized. Any person who resides in a nursing home or long-term care facility should be immunized. An adult smoker should be immunized. People with an immunocompromised condition and certain other conditions should receive both PCV13 and PPSV23 vaccines. People with human immunodeficiency virus (HIV) infection should be immunized as soon as possible after diagnosis. Immunization during chemotherapy or radiation therapy should be avoided. Routine use of PPSV23 vaccine is not recommended for American Indians, [place] Natives, or people younger than 65 years unless there are medical conditions  that require PPSV23 vaccine. When indicated, people who have unknown immunization and have no record of immunization should receive PPSV23 vaccine. One-time revaccination 5 years after the first dose of PPSV23 is recommended for people aged 19-64 years who have chronic kidney failure, nephrotic syndrome, asplenia, or immunocompromised conditions. People who received 1-2 doses of PPSV23 before age 22 years should receive another dose of PPSV23 vaccine at age 38 years or later if at least 5 years have passed since the previous dose. Doses of PPSV23 are not needed for people immunized with PPSV23 at or after age 36 years.   ? Hepatitis A  vaccine. Adults who wish to be protected from this disease, have certain high-risk conditions, work with hepatitis A-infected animals, work in hepatitis A research labs, or travel to or work in countries with a high rate of hepatitis A should be immunized. Adults who were previously unvaccinated and who anticipate close contact with an international adoptee during the first 60 days after arrival in the [place] from a country with a high rate of hepatitis A should be immunized.  ? Hepatitis B vaccine. Adults should be immunized if they wish to be protected from this disease, have certain high-risk conditions, may be exposed to blood or other infectious body fluids, are household contacts or sex partners of hepatitis B positive people, are clients or workers in certain care facilities, or travel to or work in countries with a high rate of hepatitis B.     Preventive Service / Frequency  . Ages 56 to 58  Blood pressure check.  Lipid and cholesterol check  Lung cancer screening. / Every year if you are aged 82-80 years and have a 30-pack-year history of smoking and currently smoke or have quit within the past 15 years. Yearly screening is stopped once you have quit smoking for at least 15 years or develop a health problem that would prevent you from having lung  cancer treatment.  Fecal occult blood test (FOBT) of stool. / Every year beginning at age 13 and continuing until age 69. You may not have to do this test if you get a colonoscopy every 10 years.  Flexible sigmoidoscopy** or colonoscopy.** / Every 5 years for a flexible sigmoidoscopy or every 10 years for a colonoscopy beginning at age 51 and continuing until age 71. Screening for abdominal aortic aneurysm (AAA) by ultrasound is recommended for people who have history of high blood pressure or who are current or former smokers.  ++++++++++++++++++++++++++++++++++++++++++++++++++++++++++++++  Recommend Adult Low Dose Aspirin or coated Aspirin 81 mg daily To reduce risk of Colon Cancer 20 % Skin Cancer 26 %  Melanoma 46% and Pancreatic cancer 60%  +++++++++++++++++++++++++++++++++++++++++++++++++++++++++++++  Vitamin D goal is between 50-100. Please make sure that you are taking your Vitamin D as directed.  It is very important as a natural anti-inflammatory - helping with muscle and joint aches; as well as helping hair, skin, and nails; as well as reducing stroke, heart attack and cancer risk. It helps your bones and helps with mood. It also decreases numerous cancer risks so please take it as directed.  - Low Vit D is associated with a 200-300% higher risk for CANCER and 200-300% higher risk for HEART ATTACK & STROKE.  It is also associated with higher death rate at younger ages, autoimmune diseases like Rheumatoid arthritis, Lupus, Multiple Sclerosis; also many other serious conditions, like depression, Alzheimer's Dementia, infertility, muscle aches, fatigue, fibromyalgia - just to name a few.  +++++++++++++++++++++++++++++++++++++++++++++++++++++++++++  Recommend the book "The END of DIETING" by Dr Excell Seltzer & the book "The END of DIABETES " by Dr Excell Seltzer At [email or web address] - get book & Audio CD's   --->Being diabetic has a 300% increased risk for heart attack, stroke,  cancer, and alzheimer- type vascular dementia. It is very important that you work harder with diet by avoiding all foods that are white. Avoid white rice (brown & wild rice is OK), white potatoes (sweet potatoes in moderation is OK), White bread or wheat bread or anything made out of white flour like bagels, donuts, rolls, buns, biscuits, cakes, pastries, cookies, pizza  crust, and pasta (made from white flour & egg whites) - vegetarian pasta or spinach or wheat pasta is OK. Multigrain breads like Arnold's or Pepperidge [employer], or multigrain sandwich thins or flatbreads. Diet, exercise and weight loss can reverse and cure diabetes in the early stages. Diet, exercise and weight loss is very important in the control and prevention of complications of diabetes which affects every system in your body, ie. Brain - dementia/stroke, eyes - glaucoma/blindness, heart - heart attack/heart failure, kidneys - dialysis, stomach - gastric paralysis, intestines - malabsorption, nerves - severe painful neuritis, circulation - gangrene & loss of a leg(s), and finally cancer and Alzheimers.  I recommend avoid fried & greasy foods, sweets/candy, white rice (brown or wild rice or Quinoa is OK), white potatoes (sweet potatoes are OK) - anything made from white flour - bagels, doughnuts, rolls, buns, biscuits,white and wheat breads, pizza crust and traditional pasta made of white flour & egg white(vegetarian pasta or spinach or wheat pasta is OK). Multi-grain bread is OK - like multi-grain flat bread or sandwich thins. Avoid alcohol in excess.  Exercise is also important. Eat all the vegetables you want - avoid fatty meats, especially red meat and dairy - especially cheese. Cheese is the most concentrated form of trans-fats which is the worst thing to clog up our arteries. Veggie cheese is OK which can be found in the fresh produce section at [employer] or [employer] or [employer].  ++++++++++++++++++++++ DASH Eating Plan  DASH  stands for "Dietary Approaches to Stop Hypertension."  The DASH eating plan is a healthy eating plan that has been shown to reduce high blood pressure (hypertension). Additional health benefits may include reducing the risk of type 2 diabetes mellitus, heart disease, and stroke. The DASH eating plan may also help with weight loss.  WHAT DO I NEED TO KNOW ABOUT THE DASH EATING PLAN? For the DASH eating plan, you will follow these general guidelines: . Choose foods with a percent daily value for sodium of less than 5% (as listed on the food label). . Use salt-free seasonings or herbs instead of table salt or sea salt. . Check with your health care provider or pharmacist before using salt substitutes. . Eat lower-sodium products, often labeled as "lower sodium" or "no salt added." . Eat fresh foods. . Eat more vegetables, fruits, and low-fat dairy products. . Choose whole grains. Look for the word "whole" as the first word in the ingredient list. . Choose fish . Limit sweets, desserts, sugars, and sugary drinks. . Choose heart-healthy fats. . Eat veggie cheese . Eat more home-cooked food and less restaurant, buffet, and fast food. . Limit fried foods. Lacinda Axon foods using methods other than frying. . Limit canned vegetables. If you do use them, rinse them well to decrease the sodium. . When eating at a restaurant, ask that your food be prepared with less salt, or no salt if possible.   WHAT FOODS CAN I EAT? Read Dr Fara Olden Fuhrman's books on The End of Dieting & The End of Diabetes  Grains Whole grain or whole wheat bread. Brown rice. Whole grain or whole wheat pasta. Quinoa, bulgur, and whole grain cereals. Low-sodium cereals. Corn or whole wheat flour tortillas. Whole grain cornbread. Whole grain crackers. Low-sodium crackers.  Vegetables Fresh or frozen vegetables (raw, steamed, roasted, or grilled). Low-sodium or reduced-sodium tomato and vegetable juices. Low-sodium or  reduced-sodium tomato sauce and paste. Low-sodium or reduced-sodium canned vegetables.  Fruits All fresh, canned (in natural juice),  or frozen fruits.  Protein Products All fish and seafood. Dried beans, peas, or lentils. Unsalted nuts and seeds. Unsalted canned beans.  Dairy Low-fat dairy products, such as skim or 1% milk, 2% or reduced-fat cheeses, low-fat ricotta or cottage cheese, or plain low-fat yogurt. Low-sodium or reduced-sodium cheeses.  Fats and Oils Tub margarines without trans fats. Light or reduced-fat mayonnaise and salad dressings (reduced sodium). Avocado. Safflower, olive, or canola oils. Natural peanut or almond butter.  Other Unsalted popcorn and pretzels. The items listed above may not be a complete list of recommended foods or beverages. Contact your dietitian for more options.  ++++++++++++++++++++++++++++++++++++++++++++++++++++++++++++++++  WHAT FOODS ARE NOT RECOMMENDED?  Grains/ White flour or wheat flour White bread. White pasta. White rice. Refined cornbread. Bagels and croissants. Crackers that contain trans fat. Vegetables Creamed or fried vegetables. Vegetables in a . Regular canned vegetables. Regular canned tomato sauce and paste. Regular tomato and vegetable juices. Fruits Dried fruits. Canned fruit in light or heavy syrup. Fruit juice. Meat and Other Protein Products Meat in general - RED meat & White meat. Fatty cuts of meat. Ribs, chicken wings, all processed meats as bacon, sausage, bologna, salami, fatback, hot dogs, bratwurst and packaged luncheon meats. Dairy Whole or 2% milk, cream, half-and-half, and cream cheese. Whole-fat or sweetened yogurt. Full-fat cheeses or blue cheese. Non-dairy creamers and whipped toppings. Processed cheese, cheese spreads, or cheese curds.  Condiments Onion and garlic salt, seasoned salt, table salt, and sea salt. Canned and packaged gravies. Worcestershire sauce. Tartar sauce. Barbecue sauce. Teriyaki sauce.  Soy sauce, including reduced sodium. Steak sauce. Fish sauce. Oyster sauce. Cocktail sauce. Horseradish. Ketchup and mustard. Meat flavorings and tenderizers. Bouillon cubes. Hot sauce. Tabasco sauce. Marinades. Taco seasonings. Relishes. Fats and Oils Butter, stick margarine, lard, shortening and bacon fat. Coconut, palm kernel, or palm oils. Regular salad dressings. Pickles and olives. Salted popcorn and pretzels. The items listed above may not be a complete list of foods and beverages to avoid.

## 2017-06-09 NOTE — Progress Notes (Signed)
Impression and Recommendations:    1. Essential hypertension   2. Hypertriglyceridemia   3. Vitamin D deficiency   4. Overweight (BMI 25.0-29.9)   5. Pelvic floor instability     1. HTN: Continue taking your blood pressures at home. Pt is compliant with their medications and is tolerating them well. Pt is stable and instructed to continue medications as prescribed.   2. Hypertriglyceridemia: Dietary and exercise guidelines discussed with patient. Recommended pt to reduce intake of saturated, trans fats and fatty carbohydrates. Handouts provided if desired.  3. Vitamin D deficiency: continue taking your supplementals as listed below.  4.   Overweight: Pt is compliant with their medications and is tolerating them well. Pt is stable and instructed to continue medications as prescribed. Pt encouraged to lose weight and continue her exercise regimen.  -Discussed with pt kegels and vaginal physical therapy. Encouraged pt to ask physical therapist about various treatment plans. Handouts and information provided.   -Pt instructed to send her recent lab work she received at her job to the office so we can discuss results and order more labs if necessary. We will discuss this at a later visit. Follow up in 6 months or sooner as needed.   Education and routine counseling performed. Handouts provided.  No orders of the defined types were placed in this encounter.   No orders of the defined types were placed in this encounter.    The patient was counseled, risk factors were discussed, anticipatory guidance given.  Gross side effects, risk and benefits, and alternatives of medications discussed with patient.  Patient is aware that all medications have potential side effects and we are unable to predict every side effect or drug-drug interaction that may occur.  Expresses verbal understanding and consents to current therapy plan and treatment regimen.  Return in about 6 months (around  12/07/2017) for 69mo- sooner for labs. .  Please see AVS handed out to patient at the end of our visit for further patient instructions/ counseling done pertaining to today's office visit.    Note: This document was prepared using Dragon voice recognition software and may include unintentional dictation errors.   This document serves as a record of services personally performed by DMellody Dance DO. It was created on her behalf by NMayer Masker a trained medical scribe. The creation of this record is based on the scribe's personal observations and the provider's statements to them.   I have reviewed the above medical documentation for accuracy and completeness and I concur.  DMellody Dance01/17/19 10:22 AM    Subjective:    HPI: Amber Henson is a 57y.o. female [facility] HTN and discussion of health maintenance. Pt states she had a recent vaginal prolapse to which she went to physical therapy briefly for. As a result, she has not been able to exercise as frequently as she has in the past.  Pt has lab results from her work from July 2018 and will scan the results to the office soon.  Pt has a FMHx of DM in her father.   HTN:  -  Her blood pressure has been controlled at home. She checks her BP occasionally.  - Patient reports good compliance with blood pressure medications  - Denies medication S-E   - Smoking Status noted   - She denies new onset of: chest pain, exercise intolerance, shortness of breath, dizziness,  visual changes, headache, lower extremity swelling or claudication.   Women's health: Pt has a gynecologist, Geophysicist/field seismologist at [place]. Her female care is being closely followed by her.  Dermatologist: She goes to Dr. Allyson Sabal, dermatologist, and she is being followed closely by him.  Vitamin D:  She takes 50,000 IUs weekly as well as 5,000 IUs daily.  Mood:  She denies any  problems.  Last 3 blood pressure readings in our office are as follows: BP Readings from Last 3 Encounters:  06/09/17 125/88  12/16/16 124/78  11/01/16 104/70    Pulse Readings from Last 3 Encounters:  06/09/17 68  12/16/16 89  11/01/16 62    Filed Weights   06/09/17 0903  Weight: 176 lb 4.8 oz (80 kg)      Patient Care Team    Relationship Specialty Notifications Start End  Amber Dance, DO PCP - General Family Medicine  [birth year]   Copland, Ginette Otto Referring Physician Physician Assistant  10/30/14    Comment: PA  under  Gynecologist :  Glean Salen, M.D.-   [facility] in [place], Forest Gleason, MD  General Surgery  10/30/14   Druscilla Brownie, MD Consulting Physician Dermatology  06/09/17      Lab Results  Component Value Date   CREATININE 0.79 03/12/2016   BUN 13 03/12/2016   NA 141 03/12/2016   K 4.2 03/12/2016   CL 106 03/12/2016   CO2 27 03/12/2016    Lab Results  Component Value Date   CHOL 163 01/14/2016    Lab Results  Component Value Date   HDL 50 01/14/2016    Lab Results  Component Value Date   Spelter 80 01/14/2016    Lab Results  Component Value Date   TRIG 164 (A) 01/14/2016    No results found for: CHOLHDL  No results found for: LDLDIRECT ===================================================================   Patient Active Problem List   Diagnosis Date Noted  . Vitamin D deficiency 03/28/2016    Priority: High  . Hypertriglyceridemia 03/28/2016    Priority: High  . Hypertension 01/10/2016    Priority: High  . Overweight (BMI 25.0-29.9) 01/05/2016    Priority: High  . Essential hypertension 06/09/2017  . Pelvic floor instability 06/09/2017  . Conjunctivitis of left eye 11/01/2016  . Mass of right breast on mammogram 01/10/2016  . Diffuse cystic mastopathy of both breasts (R>L) 01/10/2016  . Family history of diabetes mellitus-father 01/10/2016  . Family history of ovarian cancer- sister age 11  01/10/2016  . Family history of essential hypertension- in 25s and early 50s 01/10/2016  . Family history of early CAD 01/05/2016  . History of shingles 01/05/2016  . h/o Hemorrhoid- sx repair 6/16 11/01/2014     Past Medical History:  Diagnosis Date  . Eczema   . Family history of ovarian cancer 10/18/2014   MyRisk neg; sister  . 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 Hemorrhoids 2016   REM  by DR. BYRNETT c colonoscopy  . History of mammogram 10/18/14; 11/05/15   scattered fibroglandular densities, saw surgeon; incomplete-add views - stable;  . History of Papanicolaou smear of cervix 10/18/2014   -/-  . Hypertension   . Psoriasis   . Shingles 09/2015     Past Surgical History:  Procedure Laterality Date  . BREAST BIOPSY Left 10/2014   negative core  . COLONOSCOPY N/A 11/20/2014   Procedure: COLONOSCOPY;  Surgeon: Robert Bellow, MD;  Location: [facility] ENDOSCOPY;  Service: Endoscopy;  Laterality: N/A;  . HEMORRHOID SURGERY N/A 11/20/2014   Procedure: HEMORRHOIDECTOMY;  Surgeon: Robert Bellow, MD;  Location: [facility] ORS;  Service: General;  Laterality: N/A;     Family History  Problem Relation Age of Onset  . Hypertension Mother 40  . Colonic polyp Mother   . Diabetes Father 34       TYPE 2  . Hypertension Father 53  . Heart attack Father   . Cancer Father 42       SKIN CA IN SITU  . Cancer Sister 16       ovarian, CERVICAL  . Heart attack Brother   . Hypertension Brother   . Breast cancer Neg Hx      Social History   Substance and Sexual Activity  Drug Use No  ,  Social History   Substance and Sexual Activity  Alcohol Use No  ,  Social History   Tobacco Use  Smoking Status Never Smoker  Smokeless Tobacco Never Used  ,    Current Outpatient Medications on File Prior to Visit  Medication Sig Dispense Refill  . losartan (COZAAR) 25 MG tablet Take 1 tablet (25 mg total) by mouth daily. 90 tablet 3  . Multiple Vitamin  (MULTIVITAMIN) capsule Take 1 capsule by mouth daily.    . norethindrone (MICRONOR,CAMILA,ERRIN) 0.35 MG tablet Take 1 tablet (0.35 mg total) by mouth daily. 28 tablet 8  . Omega-3 Fatty Acids (FISH OIL PO) Take 1 capsule by mouth daily.    Marland Kitchen OVER THE COUNTER MEDICATION Bone nutrient    . Vitamin D, Ergocalciferol, (DRISDOL) 50000 units CAPS capsule Take 1 capsule (50,000 Units total) by mouth every 7 (seven) days. 12 capsule 10   No current facility-administered medications on file prior to visit.      No Known Allergies   Review of Systems:   General:  Denies fever, chills Optho/Auditory:   Denies visual changes, blurred vision Respiratory:   Denies SOB, cough, wheeze, DIB  Cardiovascular:   Denies chest pain, palpitations, painful respirations Gastrointestinal:   Denies nausea, vomiting, diarrhea.  Endocrine:     Denies new hot or cold intolerance Musculoskeletal:  Denies joint swelling, gait issues, or new unexplained myalgias/ arthralgias Skin:  Denies rash, suspicious lesions  Neurological:    Denies dizziness, unexplained weakness, numbness  Psychiatric/Behavioral:   Denies mood changes  Objective:    Blood pressure 125/88, pulse 68, height '5\' 5"'$  (1.651 m), weight 176 lb 4.8 oz (80 kg), SpO2 100 %. Body mass index is 29.34 kg/m. General: Well Developed, well nourished, and in no acute distress.  HEENT: Normocephalic, atraumatic, pupils equal round reactive to light, neck supple, No carotid bruits, no JVD Skin: Warm and dry, cap RF less 2 sec Cardiac: Regular rate and rhythm, S1, S2 WNL's, no murmurs rubs or gallops Respiratory: ECTA B/L, Not using accessory muscles, speaking in full sentences. NeuroM-Sk: Ambulates w/o assistance, moves ext * 4 w/o difficulty, sensation grossly intact.  Ext: scant edema b/l lower ext Psych: No HI/SI, judgement and insight good, Euthymic mood. Full Affect.

## 2017-07-22 ENCOUNTER — Ambulatory Visit
Admission: RE | Admit: 2017-07-22 | Discharge: 2017-07-22 | Disposition: A | Discharge status: Home / Self Care | Payer: BLUE CROSS/BLUE SHIELD | Source: Ambulatory Visit | Attending: Obstetrics and Gynecology | Admitting: Obstetrics and Gynecology

## 2017-07-22 DIAGNOSIS — R928 Other abnormal and inconclusive findings on diagnostic imaging of breast: Secondary | ICD-10-CM

## 2017-07-22 DIAGNOSIS — N6489 Other specified disorders of breast: Secondary | ICD-10-CM | POA: Diagnosis not present

## 2017-07-22 DIAGNOSIS — N6001 Solitary cyst of right breast: Secondary | ICD-10-CM | POA: Diagnosis not present

## 2017-07-22 DIAGNOSIS — N6081 Other benign mammary dysplasias of right breast: Secondary | ICD-10-CM | POA: Insufficient documentation

## 2017-09-28 ENCOUNTER — Other Ambulatory Visit: Payer: Self-pay | Admitting: Family Medicine

## 2017-10-03 ENCOUNTER — Telehealth: Payer: Self-pay

## 2017-10-03 DIAGNOSIS — Z308 Encounter for other contraceptive management: Secondary | ICD-10-CM

## 2017-10-03 MED ORDER — NORETHINDRONE 0.35 MG PO TABS: 1.0000 | ORAL_TABLET | Freq: Every day | ORAL | 2 refills | Status: DC

## 2017-10-03 NOTE — Telephone Encounter (Signed)
Mark w/[facility] calling to verify rx for this patient. Pt state of permanent residence requires a supervising physician for rx's written by an advanced practice clinician. Please provide a supervising physicians Name, NPI & address. Cb#[phone]. To help expidite the call use ref#[id number]. In order to refill a rx in a timely manner and for you to receive fewer phone calls & faxes from them, please include this information on future rx's.

## 2017-11-07 ENCOUNTER — Other Ambulatory Visit: Payer: Self-pay | Admitting: Obstetrics and Gynecology

## 2017-11-07 DIAGNOSIS — I10 Essential (primary) hypertension: Secondary | ICD-10-CM

## 2017-11-08 NOTE — Telephone Encounter (Signed)
Please advise. Thank you

## 2017-12-03 ENCOUNTER — Other Ambulatory Visit: Payer: Self-pay | Admitting: Obstetrics and Gynecology

## 2017-12-03 DIAGNOSIS — Z308 Encounter for other contraceptive management: Secondary | ICD-10-CM

## 2017-12-07 ENCOUNTER — Ambulatory Visit: Payer: BLUE CROSS/BLUE SHIELD | Admitting: Family Medicine

## 2017-12-08 LAB — LIPID PANEL
Cholesterol: 158 (ref 0–200)
HDL: 44 (ref 35–70)
LDL Cholesterol: 90
Triglycerides: 120 (ref 40–160)

## 2017-12-08 LAB — BASIC METABOLIC PANEL
Creatinine: 0.9 (ref 0.5–1.1)
GLUCOSE: 76

## 2017-12-08 LAB — HEMOGLOBIN A1C: HEMOGLOBIN A1C: 5.2

## 2017-12-19 ENCOUNTER — Encounter: Payer: Self-pay | Admitting: Obstetrics and Gynecology

## 2017-12-19 ENCOUNTER — Ambulatory Visit (INDEPENDENT_AMBULATORY_CARE_PROVIDER_SITE_OTHER): Payer: BLUE CROSS/BLUE SHIELD | Admitting: Obstetrics and Gynecology

## 2017-12-19 VITALS — BP 110/62 | HR 66 | Ht 65.0 in | Wt 177.2 lb

## 2017-12-19 DIAGNOSIS — Z1239 Encounter for other screening for malignant neoplasm of breast: Secondary | ICD-10-CM

## 2017-12-19 DIAGNOSIS — Z308 Encounter for other contraceptive management: Secondary | ICD-10-CM

## 2017-12-19 DIAGNOSIS — Z01419 Encounter for gynecological examination (general) (routine) without abnormal findings: Secondary | ICD-10-CM

## 2017-12-19 DIAGNOSIS — Z1151 Encounter for screening for human papillomavirus (HPV): Secondary | ICD-10-CM

## 2017-12-19 DIAGNOSIS — Z01411 Encounter for gynecological examination (general) (routine) with abnormal findings: Secondary | ICD-10-CM

## 2017-12-19 DIAGNOSIS — Z124 Encounter for screening for malignant neoplasm of cervix: Secondary | ICD-10-CM | POA: Diagnosis not present

## 2017-12-19 DIAGNOSIS — Z1231 Encounter for screening mammogram for malignant neoplasm of breast: Secondary | ICD-10-CM | POA: Diagnosis not present

## 2017-12-19 DIAGNOSIS — N921 Excessive and frequent menstruation with irregular cycle: Secondary | ICD-10-CM

## 2017-12-19 DIAGNOSIS — R928 Other abnormal and inconclusive findings on diagnostic imaging of breast: Secondary | ICD-10-CM

## 2017-12-19 DIAGNOSIS — I10 Essential (primary) hypertension: Secondary | ICD-10-CM

## 2017-12-19 MED ORDER — NORETHINDRONE 0.35 MG PO TABS: 1.0000 | ORAL_TABLET | Freq: Every day | ORAL | 3 refills | Status: DC

## 2017-12-19 MED ORDER — LOSARTAN POTASSIUM 25 MG PO TABS: 25.0000 mg | ORAL_TABLET | Freq: Every day | ORAL | 3 refills | Status: DC

## 2017-12-19 NOTE — Patient Instructions (Addendum)
I value your feedback and entrusting us with your care. If you get a [place] patient survey, I would appreciate you taking the time to let us know about your experience today. Thank you!  [facility]: [phone]    

## 2017-12-19 NOTE — Progress Notes (Signed)
Chief Complaint  Patient presents with  . Gynecologic Exam     HPI:      [name] is a 57 y.o. [id number] who LMP was Patient's last menstrual period was 10/23/2017 (approximate)., presents today for her annual examination.  Her menses are regular every 28-30 days, lasting 3-5 days with POPs due to HTN. Dysmenorrhea none. She had menses twice 6/19 with spotting for a couple wks 7/19. Bleeding has stopped. Hasn't happened before.   Sex activity: sexually active. Contraception: POPs. Pt knows pregnancy unlikely at her age but wants BC anyway. She does not have vaginal dryness but has mild uterine prolapse that is bothersome to her. Did pelvic PT last yr with some improvement per pt report. Pt has occas urge incont, drinks caffeine. Wonders if it's related to uterine prolapse.  Last Pap: Oct 18, 2014  Results were: no abnormalities /neg HPV DNA.  Hx of STDs: none  Last mammogram: 07/22/17  Results were: cat 3 for probably benign RT breast mass. Repeat mammo/u/s due 9/19. There is no FH of breast cancer. There is a FH of ovarian cancer in her sister, pt is MyRisk neg 2016 except RAD51C VUS. The patient does do self-breast exams.  Colonoscopy: colonoscopy 3 years ago without abnormalities. She is due for repeat after 5 yrs due to [place] of polyps/FH colon cancer in her brother.  Tobacco use: The patient denies current or previous tobacco use. Alcohol use: none Exercise: not active  She does get adequate calcium and Vitamin D in her diet.  We are managing her HTN. Her BP readings have been good per pt report. No side effects with losartan. ACEI cough resolved. Normal CMP 8/18. Does labs with PCP 9/19.    Past Medical History:  Diagnosis Date  . Eczema   . Family history of ovarian cancer 10/18/2014   MyRisk neg; sister  . 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 Hemorrhoids 2016   REM  by DR. BYRNETT c colonoscopy  . History of mammogram  10/18/14; 11/05/15   scattered fibroglandular densities, saw surgeon; incomplete-add views - stable;  . History of Papanicolaou smear of cervix 10/18/2014   -/-  . Hypertension   . Psoriasis   . Shingles 09/2015    Past Surgical History:  Procedure Laterality Date  . BREAST BIOPSY Left 10/2014   negative core  . COLONOSCOPY N/A 11/20/2014   Procedure: COLONOSCOPY;  Surgeon: Robert Bellow, MD;  Location: [facility] ENDOSCOPY;  Service: Endoscopy;  Laterality: N/A;  . HEMORRHOID SURGERY N/A 11/20/2014   Procedure: HEMORRHOIDECTOMY;  Surgeon: Robert Bellow, MD;  Location: [facility] ORS;  Service: General;  Laterality: N/A;    Family History  Problem Relation Age of Onset  . Hypertension Mother 59  . Colonic polyp Mother   . Diabetes Father 20       TYPE 2  . Hypertension Father 38  . Heart attack Father   . Cancer Father 77       SKIN CA IN SITU  . Cancer Sister 60       ovarian, CERVICAL  . Heart attack Brother   . Hypertension Brother   . Colon cancer Brother 41  . Breast cancer Neg Hx     Social History   Socioeconomic History  . Marital status: Widowed    Spouse name: Not on file  . Number of children: 2  . Years of education: 36  . Highest  education level: Not on file  Occupational History  . Occupation: LEGAL SECRETARY  Social Needs  . Financial resource strain: Not on file  . Food insecurity:    Worry: Not on file    Inability: Not on file  . Transportation needs:    Medical: Not on file    Non-medical: Not on file  Tobacco Use  . Smoking status: Never Smoker  . Smokeless tobacco: Never Used  Substance and Sexual Activity  . Alcohol use: No  . Drug use: No  . Sexual activity: Yes    Birth control/protection: None  Lifestyle  . Physical activity:    Days per week: 0 days    Minutes per session: Not on file  . Stress: Not on file  Relationships  . Social connections:    Talks on phone: Not on file    Gets together: Not on file    Attends religious  service: Not on file    Active member of club or organization: Not on file    Attends meetings of clubs or organizations: Not on file    Relationship status: Not on file  . Intimate partner violence:    Fear of current or ex partner: Not on file    Emotionally abused: Not on file    Physically abused: Not on file    Forced sexual activity: Not on file  Other Topics Concern  . Not on file  Social History Narrative  . Not on file    Current Outpatient Medications on File Prior to Visit  Medication Sig Dispense Refill  . Multiple Vitamin (MULTIVITAMIN) capsule Take 1 capsule by mouth daily.    . Omega-3 Fatty Acids (FISH OIL PO) Take 1 capsule by mouth daily.    Marland Kitchen OVER THE COUNTER MEDICATION Bone nutrient    . Vitamin D, Ergocalciferol, (DRISDOL) 50000 units CAPS capsule TAKE 1 CAPSULE BY MOUTH  EVERY 7 DAYS (Patient not taking: Reported on 12/19/2017) 12 capsule 3   No current facility-administered medications on file prior to visit.     ROS:  Review of Systems  Constitutional: Negative for fatigue, fever and unexpected weight change.  Respiratory: Negative for cough, shortness of breath and wheezing.   Cardiovascular: Negative for chest pain, palpitations and leg swelling.  Gastrointestinal: Negative for blood in stool, constipation, diarrhea, nausea and vomiting.  Endocrine: Negative for cold intolerance, heat intolerance and polyuria.  Genitourinary: Positive for vaginal bleeding. Negative for dyspareunia, dysuria, flank pain, frequency, genital sores, hematuria, menstrual problem, pelvic pain, urgency, vaginal discharge and vaginal pain.  Musculoskeletal: Negative for back pain, joint swelling and myalgias.  Skin: Negative for rash.  Neurological: Negative for dizziness, syncope, light-headedness, numbness and headaches.  Hematological: Negative for adenopathy.  Psychiatric/Behavioral: Negative for agitation, confusion, sleep disturbance and suicidal ideas. The patient is not  nervous/anxious.      Objective: BP 110/62 (BP Location: Left Arm, Patient Position: Sitting, Cuff Size: Normal)   Pulse 66   Ht '5\' 5"'$  (1.651 m)   Wt 177 lb 4 oz (80.4 kg)   LMP 10/23/2017 (Approximate)   SpO2 96%   BMI 29.50 kg/m    Physical Exam  Constitutional: She is oriented to person, place, and time. She appears well-developed and well-nourished.  Genitourinary: Vagina normal and uterus normal. There is no rash or tenderness on the right labia. There is no rash or tenderness on the left labia. No erythema or tenderness in the vagina. No vaginal discharge found. Right adnexum does not display  mass and does not display tenderness. Left adnexum does not display mass and does not display tenderness. Cervix does not exhibit motion tenderness or polyp. Uterus is not enlarged or tender.  Genitourinary Comments: MILD CYSTOCELE; NO EVID OF UTERINE PROLAPSE NOW  Neck: Normal range of motion. No thyromegaly present.  Cardiovascular: Normal rate, regular rhythm and normal heart sounds.  No murmur heard. Pulmonary/Chest: Effort normal and breath sounds normal. Right breast exhibits no mass, no nipple discharge, no skin change and no tenderness. Left breast exhibits no mass, no nipple discharge, no skin change and no tenderness.  Abdominal: Soft. There is no tenderness. There is no guarding.  Musculoskeletal: Normal range of motion.  Neurological: She is alert and oriented to person, place, and time. No cranial nerve deficit.  Psychiatric: She has a normal mood and affect. Her behavior is normal.  Vitals reviewed.   Assessment/Plan: Encounter for annual routine gynecological examination  Cervical cancer screening - Plan: IGP, Aptima HPV, CANCELED: Cytology - PAP  Screening for HPV (human papillomavirus) - Plan: IGP, Aptima HPV, CANCELED: Cytology - PAP  Screening for breast cancer - Pt to sched mammo - Plan: MM DIAG BREAST TOMO BILATERAL, US BREAST LTD UNI RIGHT INC AXILLA  Abnormal  mammogram - Plan: MM DIAG BREAST TOMO BILATERAL, US BREAST LTD UNI RIGHT INC AXILLA  Encounter for other contraceptive management - Cont POPs. Rx RF. Pt wants to make sure she can't get pregnant and prefers to be on [facility]. - Plan: norethindrone (ERRIN) 0.35 MG tablet  Breakthrough bleeding on birth control pills - F/u for further eval if sx persist.   Essential hypertension - Controlled with losartan. Rx RF to [facility]. Labs with PCP - Plan: losartan (COZAAR) 25 MG tablet  Meds ordered this encounter  Medications  . norethindrone (ERRIN) 0.35 MG tablet    Sig: Take 1 tablet (0.35 mg total) by mouth daily.    Dispense:  84 tablet    Refill:  3    Order Specific Question:   Supervising Provider    Answer:   Gae Dry [id number]  . losartan (COZAAR) 25 MG tablet    Sig: Take 1 tablet (25 mg total) by mouth daily.    Dispense:  90 tablet    Refill:  3    Order Specific Question:   Supervising Provider    Answer:   Gae Dry [[id number]]             GYN counsel breast self exam, mammography screening, menopause, adequate intake of calcium and vitamin D, diet and exercise     F/U  Return in about 1 year (around 12/20/2018).  Tamario Heal B. Heavin Sebree, PA-C 12/19/2017 10:45 AM

## 2017-12-26 LAB — IGP, APTIMA HPV
HPV Aptima: NEGATIVE
PAP SMEAR COMMENT: 0

## 2018-01-25 ENCOUNTER — Ambulatory Visit: Payer: BLUE CROSS/BLUE SHIELD | Admitting: Family Medicine

## 2018-01-25 ENCOUNTER — Other Ambulatory Visit: Payer: BLUE CROSS/BLUE SHIELD

## 2018-02-07 ENCOUNTER — Encounter: Payer: Self-pay | Admitting: Obstetrics and Gynecology

## 2018-02-07 ENCOUNTER — Ambulatory Visit
Admission: RE | Admit: 2018-02-07 | Discharge: 2018-02-07 | Disposition: A | Discharge status: Home / Self Care | Payer: BLUE CROSS/BLUE SHIELD | Source: Ambulatory Visit | Attending: Obstetrics and Gynecology | Admitting: Obstetrics and Gynecology

## 2018-02-07 DIAGNOSIS — Z1239 Encounter for other screening for malignant neoplasm of breast: Secondary | ICD-10-CM

## 2018-02-07 DIAGNOSIS — R928 Other abnormal and inconclusive findings on diagnostic imaging of breast: Secondary | ICD-10-CM

## 2018-02-07 DIAGNOSIS — N6011 Diffuse cystic mastopathy of right breast: Secondary | ICD-10-CM | POA: Diagnosis not present

## 2018-02-07 DIAGNOSIS — Z1231 Encounter for screening mammogram for malignant neoplasm of breast: Secondary | ICD-10-CM | POA: Insufficient documentation

## 2018-02-14 ENCOUNTER — Encounter: Payer: Self-pay | Admitting: Family Medicine

## 2018-02-14 ENCOUNTER — Ambulatory Visit: Payer: BLUE CROSS/BLUE SHIELD | Admitting: Family Medicine

## 2018-02-14 VITALS — BP 139/85 | HR 71 | Ht 65.0 in | Wt 176.9 lb

## 2018-02-14 DIAGNOSIS — E663 Overweight: Secondary | ICD-10-CM | POA: Diagnosis not present

## 2018-02-14 DIAGNOSIS — E559 Vitamin D deficiency, unspecified: Secondary | ICD-10-CM | POA: Diagnosis not present

## 2018-02-14 DIAGNOSIS — I10 Essential (primary) hypertension: Secondary | ICD-10-CM | POA: Diagnosis not present

## 2018-02-14 DIAGNOSIS — E781 Pure hyperglyceridemia: Secondary | ICD-10-CM | POA: Diagnosis not present

## 2018-02-14 NOTE — Progress Notes (Signed)
Impression and Recommendations:    1. Essential hypertension   2. Overweight (BMI 25.0-29.9)   3. Hypertriglyceridemia   4. Vitamin D deficiency    -Blood pressure well controlled we will continue medications.  Continue home monitoring.  -Continue going to weight watchers.  Encouraged patient to continue as this is an extremely healthy diet I recommend she do it for life.  Told patient to come in and discuss with me if she would like any other extra-support and her weight loss journey.  -Triglycerides are within normal limits and at goal.  This is improved from prior.  Patient will continue her fish oil  Vitamin D deficiency: Continue vitamin D supplementation.  Last time we got levels was back in October 2017.  Patient needs to be rechecked at her convenience in the future.  She will come in for lab only visit.  She did not want to wait for this today    Education and routine counseling performed. Handouts provided.  Orders Placed This Encounter  Procedures  . VITAMIN D 25 Hydroxy (Vit-D Deficiency, Fractures)    The patient was counseled, risk factors were discussed, anticipatory guidance given.  Gross side effects, risk and benefits, and alternatives of medications discussed with patient.  Patient is aware that all medications have potential side effects and we are unable to predict every side effect or drug-drug interaction that may occur.  Expresses verbal understanding and consents to current therapy plan and treatment regimen.  Return for Follow-up 6-12 months.  Sooner if concerns or problems ie- Bp NOT well controlled etc.  Please see AVS handed out to patient at the end of our visit for further patient instructions/ counseling done pertaining to today's office visit.    Note:  This document was prepared using Dragon voice recognition software and may include unintentional dictation errors.    Amber Henson 02/14/18 4:26 PM   Subjective:    HPI: Amber Henson  Cox is a 57 y.o. female who presents to [facility] today for follow up of [place].    -Patient brought in labs from E health screenings at her place of employment.  They were done on 12/09/2017.  Hemoglobin A1c 5.2 Glucose: Fasting 76. Serum creatinine 0.93 GFR 69  Total cholesterol 158 TG: 120 HDL: 44 VLDL: 24 LDL: 90  Mump's IgG shows immunity at 211  -No other labs were obtained at this time.   GYN:  --> She also recently has been seeing her gynecologist for routine female care.  She was recently seen by Octavio Graves and had normal Pap smear, mammogram etc.  I asked her to get me these records for our review.  Also asked CMA to obtain them   Overwt:  -Patient has been doing weight watchers for 2 years through her work-they come to her..  More recently she is gotten more serious with the past 2 months.  She is not exercising.     HTN:  -  Her blood pressure has been controlled at home.  Pt  has  been checking it regularly.  - Patient reports good compliance with blood pressure medications;   home blood pressures are running in the 110s/low 70s on a regular basis.  Her average per patient is 118/72.  - Denies medication S-E   - Smoking Status noted -none.  - She denies new onset of: chest pain, exercise intolerance, shortness of breath, dizziness, visual changes, headache, lower extremity swelling or  claudication.    Last 3 blood pressure readings in our office are as follows: BP Readings from Last 3 Encounters:  02/14/18 139/85  12/19/17 110/62  06/09/17 125/88    Pulse Readings from Last 3 Encounters:  02/14/18 71  12/19/17 66  06/09/17 68    Filed Weights   02/14/18 1523  Weight: 176 lb 14.4 oz (80.2 kg)      Patient Care Team    Relationship Specialty Notifications Start End  Amber Dance, DO PCP - General Family Medicine  09/28/30   Copland, Ginette Otto Referring Physician Physician Assistant  10/30/14     Comment: PA  under  Gynecologist :  Glean Salen, M.D.-   [facility] [place], Forest Gleason, MD  General Surgery  10/30/14   Druscilla Brownie, MD Consulting Physician Dermatology  06/09/17      Lab Results  Component Value Date   CREATININE 0.9 01/05/2017   BUN 13 03/12/2016   NA 141 03/12/2016   K 4.2 03/12/2016   CL 106 03/12/2016   CO2 27 03/12/2016    Lab Results  Component Value Date   CHOL 154 01/05/2017   CHOL 163 01/14/2016    Lab Results  Component Value Date   HDL 49 01/05/2017   HDL 50 01/14/2016    Lab Results  Component Value Date   LDLCALC 84 01/05/2017   Graham 80 01/14/2016    Lab Results  Component Value Date   TRIG 107 01/05/2017   TRIG 164 (A) 01/14/2016    No results found for: CHOLHDL  No results found for: LDLDIRECT ===================================================================   Patient Active Problem List   Diagnosis Date Noted  . Vitamin D deficiency 03/28/2016    Priority: High  . Hypertriglyceridemia 03/28/2016    Priority: High  . Hypertension 01/10/2016    Priority: High  . Overweight (BMI 25.0-29.9) 01/05/2016    Priority: High  . Essential hypertension 06/09/2017  . Pelvic floor instability 06/09/2017  . Conjunctivitis of left eye 11/01/2016  . Mass of right breast on mammogram 01/10/2016  . Diffuse cystic mastopathy of both breasts (R>L) 01/10/2016  . Family history of diabetes mellitus-father 01/10/2016  . Family history of ovarian cancer- sister age 82 01/10/2016  . Family history of essential hypertension- in 12s and early 50s 01/10/2016  . Family history of early CAD 01/05/2016  . History of shingles 01/05/2016  . h/o Hemorrhoid- sx repair 6/16 11/01/2014     Past Medical History:  Diagnosis Date  . Eczema   . Family history of ovarian cancer 10/18/2014   MyRisk neg; sister  . 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 Hemorrhoids 2016   REM  by DR. BYRNETT c  colonoscopy  . History of mammogram 10/18/14; 11/05/15   scattered fibroglandular densities, saw surgeon; incomplete-add views - stable;  . History of Papanicolaou smear of cervix 10/18/2014   -/-  . Hypertension   . Psoriasis   . Shingles 09/2015     Past Surgical History:  Procedure Laterality Date  . BREAST BIOPSY Left 10/2014   negative core  . COLONOSCOPY N/A 11/20/2014   Procedure: COLONOSCOPY;  Surgeon: Robert Bellow, MD;  Location: [facility] ENDOSCOPY;  Service: Endoscopy;  Laterality: N/A;  . HEMORRHOID SURGERY N/A 11/20/2014   Procedure: HEMORRHOIDECTOMY;  Surgeon: Robert Bellow, MD;  Location: [facility] ORS;  Service: General;  Laterality: N/A;     Family History  Problem Relation Age  of Onset  . Hypertension Mother 40  . Colonic polyp Mother   . Diabetes Father 40       TYPE 2  . Hypertension Father 25  . Heart attack Father   . Cancer Father 42       SKIN CA IN SITU  . Cancer Sister 65       ovarian, CERVICAL  . Heart attack Brother   . Hypertension Brother   . Colon cancer Brother 44  . Breast cancer Neg Hx      Social History   Substance and Sexual Activity  Drug Use No  ,  Social History   Substance and Sexual Activity  Alcohol Use No  ,  Social History   Tobacco Use  Smoking Status Never Smoker  Smokeless Tobacco Never Used  ,    Current Outpatient Medications on File Prior to Visit  Medication Sig Dispense Refill  . losartan (COZAAR) 25 MG tablet Take 1 tablet (25 mg total) by mouth daily. 90 tablet 3  . Multiple Vitamin (MULTIVITAMIN) capsule Take 1 capsule by mouth daily.    . norethindrone (ERRIN) 0.35 MG tablet Take 1 tablet (0.35 mg total) by mouth daily. 84 tablet 3  . Omega-3 Fatty Acids (FISH OIL PO) Take 1 capsule by mouth daily.    Marland Kitchen OVER THE COUNTER MEDICATION Bone nutrient    . Vitamin D, Ergocalciferol, (DRISDOL) 50000 units CAPS capsule TAKE 1 CAPSULE BY MOUTH  EVERY 7 DAYS 12 capsule 3   No current facility-administered  medications on file prior to visit.      No Known Allergies   Review of Systems:   General:  Denies fever, chills Optho/Auditory:   Denies visual changes, blurred vision Respiratory:   Denies SOB, cough, wheeze, DIB  Cardiovascular:   Denies chest pain, palpitations, painful respirations Gastrointestinal:   Denies nausea, vomiting, diarrhea.  Endocrine:     Denies new hot or cold intolerance Musculoskeletal:  Denies joint swelling, gait issues, or new unexplained myalgias/ arthralgias Skin:  Denies rash, suspicious lesions  Neurological:    Denies dizziness, unexplained weakness, numbness  Psychiatric/Behavioral:   Denies mood changes  Objective:    Blood pressure 139/85, pulse 71, height '5\' 5"'$  (1.651 m), weight 176 lb 14.4 oz (80.2 kg), SpO2 100 %.  Body mass index is 29.44 kg/m.  General: Well Developed, well nourished, and in no acute distress.  HEENT: Normocephalic, atraumatic, pupils equal round reactive to light, neck supple, No carotid bruits, no JVD Skin: Warm and dry, cap RF less 2 sec Cardiac: Regular rate and rhythm, S1, S2 WNL's, no murmurs rubs or gallops Respiratory: ECTA B/L, Not using accessory muscles, speaking in full sentences. NeuroM-Sk: Ambulates w/o assistance, moves ext * 4 w/o difficulty, sensation grossly intact.  Ext: scant edema b/l lower ext Psych: No HI/SI, judgement and insight good, Euthymic mood. Full Affect.

## 2018-02-14 NOTE — Patient Instructions (Signed)
Patient did not want to wait for her AVS today

## 2018-04-12 DIAGNOSIS — M170 Bilateral primary osteoarthritis of knee: Secondary | ICD-10-CM | POA: Diagnosis not present

## 2018-04-12 DIAGNOSIS — M79672 Pain in left foot: Secondary | ICD-10-CM | POA: Diagnosis not present

## 2018-04-12 DIAGNOSIS — M179 Osteoarthritis of knee, unspecified: Secondary | ICD-10-CM | POA: Insufficient documentation

## 2018-04-12 DIAGNOSIS — M1710 Unilateral primary osteoarthritis, unspecified knee: Secondary | ICD-10-CM | POA: Insufficient documentation

## 2018-05-25 DIAGNOSIS — S86919A Strain of unspecified muscle(s) and tendon(s) at lower leg level, unspecified leg, initial encounter: Secondary | ICD-10-CM | POA: Insufficient documentation

## 2018-05-25 DIAGNOSIS — S93409A Sprain of unspecified ligament of unspecified ankle, initial encounter: Secondary | ICD-10-CM | POA: Insufficient documentation

## 2018-05-25 DIAGNOSIS — S86911A Strain of unspecified muscle(s) and tendon(s) at lower leg level, right leg, initial encounter: Secondary | ICD-10-CM | POA: Diagnosis not present

## 2018-05-25 DIAGNOSIS — S93402A Sprain of unspecified ligament of left ankle, initial encounter: Secondary | ICD-10-CM | POA: Diagnosis not present

## 2018-06-01 DIAGNOSIS — S93402D Sprain of unspecified ligament of left ankle, subsequent encounter: Secondary | ICD-10-CM | POA: Diagnosis not present

## 2018-06-01 DIAGNOSIS — M170 Bilateral primary osteoarthritis of knee: Secondary | ICD-10-CM | POA: Diagnosis not present

## 2018-08-15 ENCOUNTER — Ambulatory Visit: Payer: BLUE CROSS/BLUE SHIELD | Admitting: Family Medicine

## 2018-10-03 ENCOUNTER — Other Ambulatory Visit: Payer: Self-pay | Admitting: Obstetrics and Gynecology

## 2018-10-03 DIAGNOSIS — Z308 Encounter for other contraceptive management: Secondary | ICD-10-CM

## 2018-11-27 DIAGNOSIS — L249 Irritant contact dermatitis, unspecified cause: Secondary | ICD-10-CM | POA: Diagnosis not present

## 2018-11-27 DIAGNOSIS — L400 Psoriasis vulgaris: Secondary | ICD-10-CM | POA: Diagnosis not present

## 2018-12-08 ENCOUNTER — Other Ambulatory Visit: Payer: Self-pay | Admitting: Obstetrics and Gynecology

## 2018-12-08 DIAGNOSIS — I10 Essential (primary) hypertension: Secondary | ICD-10-CM

## 2018-12-24 ENCOUNTER — Other Ambulatory Visit: Payer: Self-pay | Admitting: Obstetrics and Gynecology

## 2018-12-24 DIAGNOSIS — I10 Essential (primary) hypertension: Secondary | ICD-10-CM

## 2018-12-25 NOTE — Telephone Encounter (Signed)
Please advise 

## 2018-12-30 ENCOUNTER — Other Ambulatory Visit: Payer: Self-pay | Admitting: Obstetrics and Gynecology

## 2018-12-30 DIAGNOSIS — Z308 Encounter for other contraceptive management: Secondary | ICD-10-CM

## 2019-01-25 ENCOUNTER — Other Ambulatory Visit: Payer: Self-pay

## 2019-01-25 ENCOUNTER — Ambulatory Visit (INDEPENDENT_AMBULATORY_CARE_PROVIDER_SITE_OTHER): Payer: BC Managed Care – PPO | Admitting: Obstetrics and Gynecology

## 2019-01-25 ENCOUNTER — Other Ambulatory Visit: Payer: Self-pay | Admitting: Obstetrics and Gynecology

## 2019-01-25 ENCOUNTER — Encounter: Payer: Self-pay | Admitting: Obstetrics and Gynecology

## 2019-01-25 VITALS — BP 140/90 | Ht 65.0 in | Wt 185.0 lb

## 2019-01-25 DIAGNOSIS — Z01419 Encounter for gynecological examination (general) (routine) without abnormal findings: Secondary | ICD-10-CM | POA: Diagnosis not present

## 2019-01-25 DIAGNOSIS — I10 Essential (primary) hypertension: Secondary | ICD-10-CM

## 2019-01-25 DIAGNOSIS — Z1239 Encounter for other screening for malignant neoplasm of breast: Secondary | ICD-10-CM

## 2019-01-25 DIAGNOSIS — N814 Uterovaginal prolapse, unspecified: Secondary | ICD-10-CM

## 2019-01-25 MED ORDER — LOSARTAN POTASSIUM 50 MG PO TABS: 25.0000 mg | ORAL_TABLET | Freq: Every day | ORAL | 0 refills | Status: DC

## 2019-01-25 NOTE — Telephone Encounter (Signed)
Please see

## 2019-01-25 NOTE — Patient Instructions (Signed)
I value your feedback and entrusting us with your care. If you get a [facility] patient survey, I would appreciate you taking the time to let us know about your experience today. Thank you!  [facility]: [phone]    

## 2019-01-25 NOTE — Progress Notes (Signed)
Chief Complaint  Patient presents with  . Gynecologic Exam    2 periods in May and June (1 week apart), abnormal pain  . [employer] Employee     HPI:      [name] is a 58 y.o. [phone] who LMP was Patient's last menstrual period was 12/31/2018 (exact date)., presents today for her annual examination.  Her menses are usually regular every 28-30 days, lasting 5-7 days with POPs due to HTN. Dysmenorrhea none. Usually doesn't have BTB but had menses twice in May and June this yr, no late/missed pills. Had same since last yr in June and July. Sx resolved. Was under significant increased stress this spring.   Sex activity: sexually active. Contraception: POPs. Pt knows pregnancy unlikely at her age but wants BC anyway. She does not have vaginal dryness but has uterine prolapse that is bothersome to her during sex. Did pelvic PT ~2018 with some improvement per pt report, but insurance not covering it again. Pt has occas urge incont.  Last Pap: 12/19/17 Results were: no abnormalities /neg HPV DNA.  Hx of STDs: none  Last mammogram: 02/07/18  Results were: normal/repeat in 12 months. Was getting Q6 mo imaging for cat 3 for benign RT breast mass--stable for 2 yrs so on yearly schedule now. There is a FH of breast cancer in her pat cousin. There is a FH of ovarian cancer in her sister and colon cancer in her brother. 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 The patient does do self-breast exams.  Colonoscopy: colonoscopy 4 years ago without abnormalities. She is due for repeat after 5 yrs due to [place] of polyps/FH colon cancer in her brother.  Tobacco use: The patient denies current or previous tobacco use. Alcohol use: none Drug use: none Exercise: not active  She does get adequate calcium and Vitamin D in her diet.  We are managing her HTN. Her BP readings had been good last yr but have increased recently. Taking BP at home and ~130s/95. Was 160/100 at work screening 8/20. No side  effects with losartan. ACEI cough resolved. Normal CMP 7/19.   Does labs with PCP 9/19 and at work.   Past Medical History:  Diagnosis Date  . Eczema   . Family history of ovarian cancer 10/18/2014   MyRisk neg; sister  . 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 Hemorrhoids 2016   REM  by DR. BYRNETT c colonoscopy  . History of mammogram 10/18/14; 11/05/15   scattered fibroglandular densities, saw surgeon; incomplete-add views - stable;  . History of Papanicolaou smear of cervix 10/18/2014   -/-  . Hypertension   . Psoriasis   . Shingles 09/2015    Past Surgical History:  Procedure Laterality Date  . BREAST BIOPSY Left 10/2014   negative core  . COLONOSCOPY N/A 11/20/2014   Procedure: COLONOSCOPY;  Surgeon: Robert Bellow, MD;  Location: [facility] ENDOSCOPY;  Service: Endoscopy;  Laterality: N/A;  . HEMORRHOID SURGERY N/A 11/20/2014   Procedure: HEMORRHOIDECTOMY;  Surgeon: Robert Bellow, MD;  Location: [facility] ORS;  Service: General;  Laterality: N/A;    Family History  Problem Relation Age of Onset  . Hypertension Mother 62  . Colonic polyp Mother   . Diabetes Father 56       TYPE 2  . Hypertension Father 61  . Heart attack Father   . Cancer Father 81       SKIN CA  IN SITU  . Cancer Sister 62       ovarian, CERVICAL  . Heart attack Brother   . Hypertension Brother   . Colon cancer Brother 24  . Breast cancer Neg Hx     Social History   Socioeconomic History  . Marital status: Widowed    Spouse name: Not on file  . Number of children: 2  . Years of education: 10  . Highest education level: Not on file  Occupational History  . Occupation: LEGAL SECRETARY  Social Needs  . Financial resource strain: Not on file  . Food insecurity    Worry: Not on file    Inability: Not on file  . Transportation needs    Medical: Not on file    Non-medical: Not on file  Tobacco Use  . Smoking status: Never Smoker  . Smokeless tobacco: Never  Used  Substance and Sexual Activity  . Alcohol use: No  . Drug use: No  . Sexual activity: Yes    Birth control/protection: None  Lifestyle  . Physical activity    Days per week: 0 days    Minutes per session: Not on file  . Stress: Not on file  Relationships  . Social Herbalist on phone: Not on file    Gets together: Not on file    Attends religious service: Not on file    Active member of club or organization: Not on file    Attends meetings of clubs or organizations: Not on file    Relationship status: Not on file  . Intimate partner violence    Fear of current or ex partner: Not on file    Emotionally abused: Not on file    Physically abused: Not on file    Forced sexual activity: Not on file  Other Topics Concern  . Not on file  Social History Narrative  . Not on file    Current Outpatient Medications on File Prior to Visit  Medication Sig Dispense Refill  . ERRIN 0.35 MG tablet TAKE 1 TABLET BY MOUTH  DAILY 84 tablet 0  . meloxicam (MOBIC) 15 MG tablet meloxicam 15 mg tablet  TK 1 T PO QD    . Multiple Vitamin (MULTIVITAMIN) capsule Take 1 capsule by mouth daily.    . Omega-3 Fatty Acids (FISH OIL PO) Take 1 capsule by mouth daily.    Marland Kitchen OVER THE COUNTER MEDICATION Bone nutrient    . Vitamin D, Ergocalciferol, (DRISDOL) 50000 units CAPS capsule TAKE 1 CAPSULE BY MOUTH  EVERY 7 DAYS 12 capsule 3   No current facility-administered medications on file prior to visit.     ROS:  Review of Systems  Constitutional: Negative for fatigue, fever and unexpected weight change.  Respiratory: Negative for cough, shortness of breath and wheezing.   Cardiovascular: Negative for chest pain, palpitations and leg swelling.  Gastrointestinal: Negative for blood in stool, constipation, diarrhea, nausea and vomiting.  Endocrine: Negative for cold intolerance, heat intolerance and polyuria.  Genitourinary: Negative for dyspareunia, dysuria, flank pain, frequency, genital  sores, hematuria, menstrual problem, pelvic pain, urgency, vaginal bleeding, vaginal discharge and vaginal pain.  Musculoskeletal: Negative for back pain, joint swelling and myalgias.  Skin: Negative for rash.  Neurological: Negative for dizziness, syncope, light-headedness, numbness and headaches.  Hematological: Negative for adenopathy.  Psychiatric/Behavioral: Negative for agitation, confusion, sleep disturbance and suicidal ideas. The patient is not nervous/anxious.      Objective: BP 140/90   Ht 5'  5" (1.651 m)   Wt 185 lb (83.9 kg)   LMP 12/31/2018 (Exact Date)   BMI 30.79 kg/m    Physical Exam Constitutional:      Appearance: She is well-developed.  Genitourinary:     Vulva, vagina, uterus, right adnexa and left adnexa normal.     No vulval lesion or tenderness noted.     No vaginal discharge, erythema or tenderness.     No cervical motion tenderness or polyp.     Uterus is not enlarged or tender.     No right or left adnexal mass present.     Right adnexa not tender.     Left adnexa not tender.     Genitourinary Comments: GRADE 2-3 UTERINE PROLAPSE WITH VALSALVA (WORSE SINCE LAST YEAR'S EXAM)  Neck:     Musculoskeletal: Normal range of motion.     Thyroid: No thyromegaly.  Cardiovascular:     Rate and Rhythm: Normal rate and regular rhythm.     Heart sounds: Normal heart sounds. No murmur.  Pulmonary:     Effort: Pulmonary effort is normal.     Breath sounds: Normal breath sounds.  Chest:     Breasts:        Right: No mass, nipple discharge, skin change or tenderness.        Left: No mass, nipple discharge, skin change or tenderness.  Abdominal:     Palpations: Abdomen is soft.     Tenderness: There is no abdominal tenderness. There is no guarding.  Musculoskeletal: Normal range of motion.  Neurological:     General: No focal deficit present.     Mental Status: She is alert and oriented to person, place, and time.     Cranial Nerves: No cranial nerve  deficit.  Skin:    General: Skin is warm and dry.  Psychiatric:        Mood and Affect: Mood normal.        Behavior: Behavior normal.        Thought Content: Thought content normal.        Judgment: Judgment normal.  Vitals signs reviewed.     Assessment/Plan: Encounter for annual routine gynecological examination  Screening for breast cancer - Plan: MM 3D SCREEN BREAST BILATERAL--pt to sched mammo  Essential hypertension - Uncontrolled with losartan 25 mg. Increase to 50 mg, Rx to local pharm. Rechk at MD appt for uterine prolapse--will f/u with her after appt in office or via phone. If ok, will send RF to optum.  Labs with PCP - Plan: losartan (COZAAR) 50 MG tablet  Uterine prolapse--Grade 2-3 with valsalva. Refer to MD for mgmt options.   Breakthrough bleeding on POP--Sx resolved a couple months ago. F/u if sx persist for GYN u/s. Moot issue if pt has hyst due to prolapse.  Meds ordered this encounter  Medications  . losartan (COZAAR) 50 MG tablet    Sig: Take 0.5 tablets (25 mg total) by mouth daily.    Dispense:  30 tablet    Refill:  0    Order Specific Question:   Supervising Provider    Answer:   Gae Dry [[id number]]             GYN counsel breast self exam, mammography screening, menopause, adequate intake of calcium and vitamin D, diet and exercise     F/U  Return in about 4 weeks (around 02/22/2019) for with MD for uterine prolapse consult/BP check.  Kojo Liby B. Pennie Vanblarcom, PA-C 01/25/2019  3:01 PM

## 2019-01-26 ENCOUNTER — Other Ambulatory Visit: Payer: Self-pay | Admitting: Obstetrics and Gynecology

## 2019-01-26 ENCOUNTER — Telehealth: Payer: Self-pay | Admitting: Obstetrics and Gynecology

## 2019-01-26 DIAGNOSIS — I10 Essential (primary) hypertension: Secondary | ICD-10-CM

## 2019-01-26 MED ORDER — LOSARTAN POTASSIUM 50 MG PO TABS: 50.0000 mg | ORAL_TABLET | Freq: Every day | ORAL | 0 refills | Status: DC

## 2019-01-26 NOTE — Telephone Encounter (Signed)
Please advise. I read the same thing on your notes.

## 2019-01-26 NOTE — Telephone Encounter (Signed)
Pt aware. Says she was given 15 tablets only. Told her [facility] sent out new rx with 30 tablets.

## 2019-01-26 NOTE — Telephone Encounter (Signed)
She is to take 1 tab daily. Pls notify pt. I'll correct in chart. Thx.

## 2019-01-26 NOTE — Progress Notes (Signed)
Rx sig modified to take 50 mg daily.

## 2019-01-26 NOTE — Telephone Encounter (Signed)
Patient is calling to find out about her dosage for her Blood Pressure medication for losartan (COZAAR) 50 MG tablet. On the prescription she picked up is stating she is the break in half but was told by [facility] that the medication is to because increased from 25 mg to 50 mg. Please advise

## 2019-01-30 ENCOUNTER — Telehealth: Payer: Self-pay

## 2019-01-30 ENCOUNTER — Telehealth: Payer: Self-pay | Admitting: Obstetrics and Gynecology

## 2019-01-30 ENCOUNTER — Other Ambulatory Visit: Payer: Self-pay | Admitting: Obstetrics and Gynecology

## 2019-01-30 DIAGNOSIS — I10 Essential (primary) hypertension: Secondary | ICD-10-CM

## 2019-01-30 MED ORDER — LOSARTAN POTASSIUM 50 MG PO TABS: 50.0000 mg | ORAL_TABLET | Freq: Every day | ORAL | 0 refills | Status: DC

## 2019-01-30 NOTE — Telephone Encounter (Signed)
Pt calling; [facility] was supposed to send in rx Fri to [facility] in [place].  They have no record of it.  What is the status?  [phone]  Adv pt it was send to [facility] in [place].  Pt states they had no record of it either.  Adv pt I would give verbal order.  Pt wanted it called in to [facility] in [place].  Send by eRx to [facility] location.

## 2019-01-30 NOTE — Telephone Encounter (Signed)
Pt calling; she received 15tablets of losartan on 9/3; needs rx sent to [facility] on [address] [facility] in [place] for 15 more tabs as ins will not pay for 30 more.  [phone]

## 2019-01-30 NOTE — Telephone Encounter (Signed)
Spoke to pt already.

## 2019-01-30 NOTE — Telephone Encounter (Signed)
Pls notify pt Rx eRxd for #15 tabs. And again, sorry it got messed up initially. Thx.

## 2019-01-30 NOTE — Telephone Encounter (Signed)
Please advise 

## 2019-01-30 NOTE — Progress Notes (Signed)
Rx RF for #15 tabs since only got #15 tabs at first fill. Needs addl tabs to complete the month.

## 2019-01-30 NOTE — Telephone Encounter (Signed)
Pt aware.

## 2019-01-30 NOTE — Telephone Encounter (Signed)
Please advise.c

## 2019-01-30 NOTE — Telephone Encounter (Signed)
Patient returning call. Would like a call back

## 2019-01-30 NOTE — Telephone Encounter (Signed)
This encounter was created in error - please disregard.

## 2019-02-18 ENCOUNTER — Other Ambulatory Visit: Payer: Self-pay | Admitting: Obstetrics and Gynecology

## 2019-02-19 NOTE — Telephone Encounter (Signed)
Please advise 

## 2019-02-23 ENCOUNTER — Other Ambulatory Visit: Payer: Self-pay

## 2019-02-23 ENCOUNTER — Ambulatory Visit (INDEPENDENT_AMBULATORY_CARE_PROVIDER_SITE_OTHER): Payer: BC Managed Care – PPO

## 2019-02-23 ENCOUNTER — Ambulatory Visit: Payer: BC Managed Care – PPO | Admitting: Obstetrics and Gynecology

## 2019-02-23 VITALS — BP 140/100 | Wt 186.0 lb

## 2019-02-23 DIAGNOSIS — I10 Essential (primary) hypertension: Secondary | ICD-10-CM

## 2019-02-23 NOTE — Progress Notes (Signed)
Spoke with pt re: BP of 140/100 today. She was a little stressed due to provider being late and having to reschedule appt. BP about 134/88 at home. Doing well on losartan 50 mg dose so far. Will rechk BP at 03/09/19 MD appt (resched from today) and re-eval then. If ok, will RF meds to mail order. Pt has enough Rx to get to appt. F/u sooner prn.

## 2019-03-06 ENCOUNTER — Other Ambulatory Visit: Payer: Self-pay | Admitting: Obstetrics and Gynecology

## 2019-03-07 ENCOUNTER — Ambulatory Visit
Admission: RE | Admit: 2019-03-07 | Discharge: 2019-03-07 | Disposition: A | Discharge status: Home / Self Care | Payer: BC Managed Care – PPO | Source: Ambulatory Visit | Attending: Obstetrics and Gynecology | Admitting: Obstetrics and Gynecology

## 2019-03-07 ENCOUNTER — Encounter: Payer: Self-pay | Admitting: Family Medicine

## 2019-03-07 ENCOUNTER — Other Ambulatory Visit: Payer: Self-pay

## 2019-03-07 ENCOUNTER — Ambulatory Visit (INDEPENDENT_AMBULATORY_CARE_PROVIDER_SITE_OTHER): Payer: BC Managed Care – PPO | Admitting: Family Medicine

## 2019-03-07 VITALS — BP 136/93 | HR 72 | Temp 97.7°F | Resp 16 | Ht 65.0 in | Wt 187.9 lb

## 2019-03-07 DIAGNOSIS — Z139 Encounter for screening, unspecified: Secondary | ICD-10-CM

## 2019-03-07 DIAGNOSIS — I10 Essential (primary) hypertension: Secondary | ICD-10-CM | POA: Diagnosis not present

## 2019-03-07 DIAGNOSIS — Z1231 Encounter for screening mammogram for malignant neoplasm of breast: Secondary | ICD-10-CM | POA: Diagnosis not present

## 2019-03-07 DIAGNOSIS — Z1239 Encounter for other screening for malignant neoplasm of breast: Secondary | ICD-10-CM

## 2019-03-07 DIAGNOSIS — Z719 Counseling, unspecified: Secondary | ICD-10-CM

## 2019-03-07 DIAGNOSIS — Z0001 Encounter for general adult medical examination with abnormal findings: Secondary | ICD-10-CM

## 2019-03-07 DIAGNOSIS — E559 Vitamin D deficiency, unspecified: Secondary | ICD-10-CM

## 2019-03-07 NOTE — Patient Instructions (Addendum)
Preventive Care for Adults, Female  A healthy lifestyle and preventive care can promote health and wellness. Preventive health guidelines for women include the following key practices.   A routine yearly physical is a good way to check with your health care provider about your health and preventive screening. It is a chance to share any concerns and updates on your health and to receive a thorough exam.   Visit your dentist for a routine exam and preventive care every 6 months. Brush your teeth twice a day and floss once a day. Good oral hygiene prevents tooth decay and gum disease.   The frequency of eye exams is based on your age, health, family medical history, use of contact lenses, and other factors. Follow your health care provider's recommendations for frequency of eye exams.   Eat a healthy diet. Foods like vegetables, fruits, whole grains, low-fat dairy products, and lean protein foods contain the nutrients you need without too many calories. Decrease your intake of foods high in solid fats, added sugars, and salt. Eat the right amount of calories for you.Get information about a proper diet from your health care provider, if necessary.   Regular physical exercise is one of the most important things you can do for your health. Most adults should get at least 150 minutes of moderate-intensity exercise (any activity that increases your heart rate and causes you to sweat) each week. In addition, most adults need muscle-strengthening exercises on 2 or more days a week.   Maintain a healthy weight. The body mass index (BMI) is a screening tool to identify possible weight problems. It provides an estimate of body fat based on height and weight. Your health care provider can find your BMI, and can help you achieve or maintain a healthy weight.For adults 20 years and older:   - A BMI below 18.5 is considered underweight.   - A BMI of 18.5 to 24.9 is normal.   - A BMI of 25 to 29.9 is  considered overweight.   - A BMI of 30 and above is considered obese.   Maintain normal blood lipids and cholesterol levels by exercising and minimizing your intake of trans and saturated fats.  Eat a balanced diet with plenty of fruit and vegetables. Blood tests for lipids and cholesterol should begin at age 65 and be repeated every 5 years minimum.  If your lipid or cholesterol levels are high, you are over 40, or you are at high risk for heart disease, you may need your cholesterol levels checked more frequently.Ongoing high lipid and cholesterol levels should be treated with medicines if diet and exercise are not working.   If you smoke, find out from your health care provider how to quit. If you do not use tobacco, do not start.   Lung cancer screening is recommended for adults aged 10-80 years who are at high risk for developing lung cancer because of a history of smoking. A yearly low-dose CT scan of the lungs is recommended for people who have at least a 30-pack-year history of smoking and are a current smoker or have quit within the past 15 years. A pack year of smoking is smoking an average of 1 pack of cigarettes a day for 1 year (for example: 1 pack a day for 30 years or 2 packs a day for 15 years). Yearly screening should continue until the smoker has stopped smoking for at least 15 years. Yearly screening should be stopped for people who develop a  health problem that would prevent them from having lung cancer treatment.   If you are pregnant, do not drink alcohol. If you are breastfeeding, be very cautious about drinking alcohol. If you are not pregnant and choose to drink alcohol, do not have more than 1 drink per day. One drink is considered to be 12 ounces (355 mL) of beer, 5 ounces (148 mL) of wine, or 1.5 ounces (44 mL) of liquor.   Avoid use of street drugs. Do not share needles with anyone. Ask for help if you need support or instructions about stopping the use of  drugs.   High blood pressure causes heart disease and increases the risk of stroke. Your blood pressure should be checked at least yearly.  Ongoing high blood pressure should be treated with medicines if weight loss and exercise do not work.   If you are 25-14 years old, ask your health care provider if you should take aspirin to prevent strokes.   Diabetes screening involves taking a blood sample to check your fasting blood sugar level. This should be done once every 3 years, after age 48, if you are within normal weight and without risk factors for diabetes. Testing should be considered at a younger age or be carried out more frequently if you are overweight and have at least 1 risk factor for diabetes.   Breast cancer screening is essential preventive care for women. You should practice "breast self-awareness."  This means understanding the normal appearance and feel of your breasts and may include breast self-examination.  Any changes detected, no matter how small, should be reported to a health care provider.  Women in their 89s and 30s should have a clinical breast exam (CBE) by a health care provider as part of a regular health exam every 1 to 3 years.  After age 63, women should have a CBE every year.  Starting at age 81, women should consider having a mammogram (breast X-ray test) every year.  Women who have a family history of breast cancer should talk to their health care provider about genetic screening.  Women at a high risk of breast cancer should talk to their health care providers about having an MRI and a mammogram every year.   -Breast cancer gene (BRCA)-related cancer risk assessment is recommended for women who have family members with BRCA-related cancers. BRCA-related cancers include breast, ovarian, tubal, and peritoneal cancers. Having family members with these cancers may be associated with an increased risk for harmful changes (mutations) in the breast cancer genes BRCA1 and  BRCA2. Results of the assessment will determine the need for genetic counseling and BRCA1 and BRCA2 testing.   The Pap test is a screening test for cervical cancer. A Pap test can show cell changes on the cervix that might become cervical cancer if left untreated. A Pap test is a procedure in which cells are obtained and examined from the lower end of the uterus (cervix).   - Women should have a Pap test starting at age 13.   - Between ages 51 and 22, Pap tests should be repeated every 2 years.   - Beginning at age 70, you should have a Pap test every 3 years as long as the past 3 Pap tests have been normal.   - Some women have medical problems that increase the chance of getting cervical cancer. Talk to your health care provider about these problems. It is especially important to talk to your health care provider if a  new problem develops soon after your last Pap test. In these cases, your health care provider may recommend more frequent screening and Pap tests.   - The above recommendations are the same for women who have or have not gotten the vaccine for human papillomavirus (HPV).   - If you had a hysterectomy for a problem that was not cancer or a condition that could lead to cancer, then you no longer need Pap tests. Even if you no longer need a Pap test, a regular exam is a good idea to make sure no other problems are starting.   - If you are between ages 8 and 94 years, and you have had normal Pap tests going back 10 years, you no longer need Pap tests. Even if you no longer need a Pap test, a regular exam is a good idea to make sure no other problems are starting.   - If you have had past treatment for cervical cancer or a condition that could lead to cancer, you need Pap tests and screening for cancer for at least 20 years after your treatment.   - If Pap tests have been discontinued, risk factors (such as a new sexual partner) need to be reassessed to determine if screening should  be resumed.   - The HPV test is an additional test that may be used for cervical cancer screening. The HPV test looks for the virus that can cause the cell changes on the cervix. The cells collected during the Pap test can be tested for HPV. The HPV test could be used to screen women aged 9 years and older, and should be used in women of any age who have unclear Pap test results. After the age of 48, women should have HPV testing at the same frequency as a Pap test.   Colorectal cancer can be detected and often prevented. Most routine colorectal cancer screening begins at the age of 94 years and continues through age 56 years. However, your health care provider may recommend screening at an earlier age if you have risk factors for colon cancer. On a yearly basis, your health care provider may provide home test kits to check for hidden blood in the stool.  Use of a small camera at the end of a tube, to directly examine the colon (sigmoidoscopy or colonoscopy), can detect the earliest forms of colorectal cancer. Talk to your health care provider about this at age 47, when routine screening begins. Direct exam of the colon should be repeated every 5 -10 years through age 65 years, unless early forms of pre-cancerous polyps or small growths are found.   People who are at an increased risk for hepatitis B should be screened for this virus. You are considered at high risk for hepatitis B if:  -You were born in a country where hepatitis B occurs often. Talk with your health care provider about which countries are considered high risk.  - Your parents were born in a high-risk country and you have not received a shot to protect against hepatitis B (hepatitis B vaccine).  - You have HIV or AIDS.  - You use needles to inject street drugs.  - You live with, or have sex with, someone who has Hepatitis B.  - You get hemodialysis treatment.  - You take certain medicines for conditions like cancer, organ  transplantation, and autoimmune conditions.   Hepatitis C blood testing is recommended for all people born from [birth year] through 1965 and any individual  with known risks for hepatitis C.   Practice safe sex. Use condoms and avoid high-risk sexual practices to reduce the spread of sexually transmitted infections (STIs). STIs include gonorrhea, chlamydia, syphilis, trichomonas, herpes, HPV, and human immunodeficiency virus (HIV). Herpes, HIV, and HPV are viral illnesses that have no cure. They can result in disability, cancer, and death. Sexually active women aged 17 years and younger should be checked for chlamydia. Older women with new or multiple partners should also be tested for chlamydia. Testing for other STIs is recommended if you are sexually active and at increased risk.   Osteoporosis is a disease in which the bones lose minerals and strength with aging. This can result in serious bone fractures or breaks. The risk of osteoporosis can be identified using a bone density scan. Women ages 38 years and over and women at risk for fractures or osteoporosis should discuss screening with their health care providers. Ask your health care provider whether you should take a calcium supplement or vitamin D to There are also several preventive steps women can take to avoid osteoporosis and resulting fractures or to keep osteoporosis from worsening. -->Recommendations include:  Eat a balanced diet high in fruits, vegetables, calcium, and vitamins.  Get enough calcium. The recommended total intake of is 1,200 mg daily; for best absorption, if taking supplements, divide doses into 250-500 mg doses throughout the day. Of the two types of calcium, calcium carbonate is best absorbed when taken with food but calcium citrate can be taken on an empty stomach.  Get enough vitamin D. [employer] and the [place] recommend at least 1,000 IU per day for women age 28 and over who are at risk of vitamin D  deficiency. Vitamin D deficiency can be caused by inadequate sun exposure (for example, those who live in [place]).  Avoid alcohol and smoking. Heavy alcohol intake (more than 7 drinks per week) increases the risk of falls and hip fracture and women smokers tend to lose bone more rapidly and have lower bone mass than nonsmokers. Stopping smoking is one of the most important changes women can make to improve their health and decrease risk for disease.  Be physically active every day. Weight-bearing exercise (for example, fast walking, hiking, jogging, and weight training) may strengthen bones or slow the rate of bone loss that comes with aging. Balancing and muscle-strengthening exercises can reduce the risk of falling and fracture.  Consider therapeutic medications. Currently, several types of effective drugs are available. Healthcare providers can recommend the type most appropriate for each woman.  Eliminate environmental factors that may contribute to accidents. Falls cause nearly 90% of all osteoporotic fractures, so reducing this risk is an important bone-health strategy. Measures include ample lighting, removing obstructions to walking, using nonskid rugs on floors, and placing mats and/or grab bars in showers.  Be aware of medication side effects. Some common medicines make bones weaker. These include a type of steroid drug called glucocorticoids used for arthritis and asthma, some antiseizure drugs, certain sleeping pills, treatments for endometriosis, and some cancer drugs. An overactive thyroid gland or using too much thyroid hormone for an underactive thyroid can also be a problem. If you are taking these medicines, talk to your doctor about what you can do to help protect your bones.reduce the rate of osteoporosis.    Menopause can be associated with physical symptoms and risks. Hormone replacement therapy is available to decrease symptoms and risks. You should talk to your  health care provider  about whether hormone replacement therapy is right for you.   Use sunscreen. Apply sunscreen liberally and repeatedly throughout the day. You should seek shade when your shadow is shorter than you. Protect yourself by wearing long sleeves, pants, a wide-brimmed hat, and sunglasses year round, whenever you are outdoors.   Once a month, do a whole body skin exam, using a mirror to look at the skin on your back. Tell your health care provider of new moles, moles that have irregular borders, moles that are larger than a pencil eraser, or moles that have changed in shape or color.   -Stay current with required vaccines (immunizations).   Influenza vaccine. All adults should be immunized every year.  Tetanus, diphtheria, and acellular pertussis (Td, Tdap) vaccine. Pregnant women should receive 1 dose of Tdap vaccine during each pregnancy. The dose should be obtained regardless of the length of time since the last dose. Immunization is preferred during the 27th 36th week of gestation. An adult who has not previously received Tdap or who does not know her vaccine status should receive 1 dose of Tdap. This initial dose should be followed by tetanus and diphtheria toxoids (Td) booster doses every 10 years. Adults with an unknown or incomplete history of completing a 3-dose immunization series with Td-containing vaccines should begin or complete a primary immunization series including a Tdap dose. Adults should receive a Td booster every 10 years.  Varicella vaccine. An adult without evidence of immunity to varicella should receive 2 doses or a second dose if she has previously received 1 dose. Pregnant females who do not have evidence of immunity should receive the first dose after pregnancy. This first dose should be obtained before leaving the health care facility. The second dose should be obtained 4 8 weeks after the first dose.  Human papillomavirus (HPV) vaccine. Females aged 13 26  years who have not received the vaccine previously should obtain the 3-dose series. The vaccine is not recommended for use in pregnant females. However, pregnancy testing is not needed before receiving a dose. If a female is found to be pregnant after receiving a dose, no treatment is needed. In that case, the remaining doses should be delayed until after the pregnancy. Immunization is recommended for any person with an immunocompromised condition through the age of 26 years if she did not get any or all doses earlier. During the 3-dose series, the second dose should be obtained 4 8 weeks after the first dose. The third dose should be obtained 24 weeks after the first dose and 16 weeks after the second dose.  Zoster vaccine. One dose is recommended for adults aged 60 years or older unless certain conditions are present.  Measles, mumps, and rubella (MMR) vaccine. Adults born before 1957 generally are considered immune to measles and mumps. Adults born in 1957 or later should have 1 or more doses of MMR vaccine unless there is a contraindication to the vaccine or there is laboratory evidence of immunity to each of the three diseases. A routine second dose of MMR vaccine should be obtained at least 28 days after the first dose for students attending postsecondary schools, health care workers, or international travelers. People who received inactivated measles vaccine or an unknown type of measles vaccine during 1963 1967 should receive 2 doses of MMR vaccine. People who received inactivated mumps vaccine or an unknown type of mumps vaccine before 1979 and are at high risk for mumps infection should consider immunization with 2 doses of   MMR vaccine. For females of childbearing age, rubella immunity should be determined. If there is no evidence of immunity, females who are not pregnant should be vaccinated. If there is no evidence of immunity, females who are pregnant should delay immunization until after pregnancy.  Unvaccinated health care workers born before 1957 who lack laboratory evidence of measles, mumps, or rubella immunity or laboratory confirmation of disease should consider measles and mumps immunization with 2 doses of MMR vaccine or rubella immunization with 1 dose of MMR vaccine.  Pneumococcal 13-valent conjugate (PCV13) vaccine. When indicated, a person who is uncertain of her immunization history and has no record of immunization should receive the PCV13 vaccine. An adult aged 19 years or older who has certain medical conditions and has not been previously immunized should receive 1 dose of PCV13 vaccine. This PCV13 should be followed with a dose of pneumococcal polysaccharide (PPSV23) vaccine. The PPSV23 vaccine dose should be obtained at least 8 weeks after the dose of PCV13 vaccine. An adult aged 19 years or older who has certain medical conditions and previously received 1 or more doses of PPSV23 vaccine should receive 1 dose of PCV13. The PCV13 vaccine dose should be obtained 1 or more years after the last PPSV23 vaccine dose.  Pneumococcal polysaccharide (PPSV23) vaccine. When PCV13 is also indicated, PCV13 should be obtained first. All adults aged 65 years and older should be immunized. An adult younger than age 65 years who has certain medical conditions should be immunized. Any person who resides in a nursing home or long-term care facility should be immunized. An adult smoker should be immunized. People with an immunocompromised condition and certain other conditions should receive both PCV13 and PPSV23 vaccines. People with human immunodeficiency virus (HIV) infection should be immunized as soon as possible after diagnosis. Immunization during chemotherapy or radiation therapy should be avoided. Routine use of PPSV23 vaccine is not recommended for American Indians, [place] Natives, or people younger than 65 years unless there are medical conditions that require PPSV23 vaccine. When indicated,  people who have unknown immunization and have no record of immunization should receive PPSV23 vaccine. One-time revaccination 5 years after the first dose of PPSV23 is recommended for people aged 19 64 years who have chronic kidney failure, nephrotic syndrome, asplenia, or immunocompromised conditions. People who received 1 2 doses of PPSV23 before age 65 years should receive another dose of PPSV23 vaccine at age 65 years or later if at least 5 years have passed since the previous dose. Doses of PPSV23 are not needed for people immunized with PPSV23 at or after age 65 years.  Meningococcal vaccine. Adults with asplenia or persistent complement component deficiencies should receive 2 doses of quadrivalent meningococcal conjugate (MenACWY-D) vaccine. The doses should be obtained at least 2 months apart. Microbiologists working with certain meningococcal bacteria, military recruits, people at risk during an outbreak, and people who travel to or live in countries with a high rate of meningitis should be immunized. A first-year college student up through age 21 years who is living in a residence hall should receive a dose if she did not receive a dose on or after her 16th birthday. Adults who have certain high-risk conditions should receive one or more doses of vaccine.  Hepatitis A vaccine. Adults who wish to be protected from this disease, have certain high-risk conditions, work with hepatitis A-infected animals, work in hepatitis A research labs, or travel to or work in countries with a high rate of hepatitis A should be   immunized. Adults who were previously unvaccinated and who anticipate close contact with an international adoptee during the first 60 days after arrival in the [place] from a country with a high rate of hepatitis A should be immunized.  Hepatitis B vaccine.  Adults who wish to be protected from this disease, have certain high-risk conditions, may be exposed to blood or other infectious  body fluids, are household contacts or sex partners of hepatitis B positive people, are clients or workers in certain care facilities, or travel to or work in countries with a high rate of hepatitis B should be immunized.  Haemophilus influenzae type b (Hib) vaccine. A previously unvaccinated person with asplenia or sickle cell disease or having a scheduled splenectomy should receive 1 dose of Hib vaccine. Regardless of previous immunization, a recipient of a hematopoietic stem cell transplant should receive a 3-dose series 6 12 months after her successful transplant. Hib vaccine is not recommended for adults with HIV infection.  Preventive Services / Frequency Ages 19 to 39years  Blood pressure check.** / Every 1 to 2 years.  Lipid and cholesterol check.** / Every 5 years beginning at age 20.  Clinical breast exam.** / Every 3 years for women in their 20s and 30s.  BRCA-related cancer risk assessment.** / For women who have family members with a BRCA-related cancer (breast, ovarian, tubal, or peritoneal cancers).  Pap test.** / Every 2 years from ages 21 through 29. Every 3 years starting at age 30 through age 65 or 70 with a history of 3 consecutive normal Pap tests.  HPV screening.** / Every 3 years from ages 30 through ages 65 to 70 with a history of 3 consecutive normal Pap tests.  Hepatitis C blood test.** / For any individual with known risks for hepatitis C.  Skin self-exam. / Monthly.  Influenza vaccine. / Every year.  Tetanus, diphtheria, and acellular pertussis (Tdap, Td) vaccine.** / Consult your health care provider. Pregnant women should receive 1 dose of Tdap vaccine during each pregnancy. 1 dose of Td every 10 years.  Varicella vaccine.** / Consult your health care provider. Pregnant females who do not have evidence of immunity should receive the first dose after pregnancy.  HPV vaccine. / 3 doses over 6 months, if 26 and younger. The vaccine is not recommended for use in  pregnant females. However, pregnancy testing is not needed before receiving a dose.  Measles, mumps, rubella (MMR) vaccine.** / You need at least 1 dose of MMR if you were born in 1957 or later. You may also need a 2nd dose. For females of childbearing age, rubella immunity should be determined. If there is no evidence of immunity, females who are not pregnant should be vaccinated. If there is no evidence of immunity, females who are pregnant should delay immunization until after pregnancy.  Pneumococcal 13-valent conjugate (PCV13) vaccine.** / Consult your health care provider.  Pneumococcal polysaccharide (PPSV23) vaccine.** / 1 to 2 doses if you smoke cigarettes or if you have certain conditions.  Meningococcal vaccine.** / 1 dose if you are age 19 to 21 years and a first-year college student living in a residence hall, or have one of several medical conditions, you need to get vaccinated against meningococcal disease. You may also need additional booster doses.  Hepatitis A vaccine.** / Consult your health care provider.  Hepatitis B vaccine.** / Consult your health care provider.  Haemophilus influenzae type b (Hib) vaccine.** / Consult your health care provider.  Ages 40 to 64years    Blood pressure check.** / Every 1 to 2 years.  Lipid and cholesterol check.** / Every 5 years beginning at age 22 years.  Lung cancer screening. / Every year if you are aged 81 80 years and have a 30-pack-year history of smoking and currently smoke or have quit within the past 15 years. Yearly screening is stopped once you have quit smoking for at least 15 years or develop a health problem that would prevent you from having lung cancer treatment.  Clinical breast exam.** / Every year after age 100 years.  BRCA-related cancer risk assessment.** / For women who have family members with a BRCA-related cancer (breast, ovarian, tubal, or peritoneal cancers).  Mammogram.** / Every year beginning at age 74  years and continuing for as long as you are in good health. Consult with your health care provider.  Pap test.** / Every 3 years starting at age 28 years through age 30 or 38 years with a history of 3 consecutive normal Pap tests.  HPV screening.** / Every 3 years from ages 85 years through ages 91 to 58 years with a history of 3 consecutive normal Pap tests.  Fecal occult blood test (FOBT) of stool. / Every year beginning at age 60 years and continuing until age 55 years. You may not need to do this test if you get a colonoscopy every 10 years.  Flexible sigmoidoscopy or colonoscopy.** / Every 5 years for a flexible sigmoidoscopy or every 10 years for a colonoscopy beginning at age 3 years and continuing until age 56 years.  Hepatitis C blood test.** / For all people born from [birth year] through 1963 and any individual with known risks for hepatitis C.  Skin self-exam. / Monthly.  Influenza vaccine. / Every year.  Tetanus, diphtheria, and acellular pertussis (Tdap/Td) vaccine.** / Consult your health care provider. Pregnant women should receive 1 dose of Tdap vaccine during each pregnancy. 1 dose of Td every 10 years.  Varicella vaccine.** / Consult your health care provider. Pregnant females who do not have evidence of immunity should receive the first dose after pregnancy.  Zoster vaccine.** / 1 dose for adults aged 50 years or older.  Measles, mumps, rubella (MMR) vaccine.** / You need at least 1 dose of MMR if you were born in 1957 or later. You may also need a 2nd dose. For females of childbearing age, rubella immunity should be determined. If there is no evidence of immunity, females who are not pregnant should be vaccinated. If there is no evidence of immunity, females who are pregnant should delay immunization until after pregnancy.  Pneumococcal 13-valent conjugate (PCV13) vaccine.** / Consult your health care provider.  Pneumococcal polysaccharide (PPSV23) vaccine.** / 1 to 2 doses if  you smoke cigarettes or if you have certain conditions.  Meningococcal vaccine.** / Consult your health care provider.  Hepatitis A vaccine.** / Consult your health care provider.  Hepatitis B vaccine.** / Consult your health care provider.  Haemophilus influenzae type b (Hib) vaccine.** / Consult your health care provider.  Ages 47 years and over  Blood pressure check.** / Every 1 to 2 years.  Lipid and cholesterol check.** / Every 5 years beginning at age 55 years.  Lung cancer screening. / Every year if you are aged 57 80 years and have a 30-pack-year history of smoking and currently smoke or have quit within the past 15 years. Yearly screening is stopped once you have quit smoking for at least 15 years or develop a health problem that  would prevent you from having lung cancer treatment.  Clinical breast exam.** / Every year after age 68 years.  BRCA-related cancer risk assessment.** / For women who have family members with a BRCA-related cancer (breast, ovarian, tubal, or peritoneal cancers).  Mammogram.** / Every year beginning at age 47 years and continuing for as long as you are in good health. Consult with your health care provider.  Pap test.** / Every 3 years starting at age 59 years through age 26 or 26 years with 3 consecutive normal Pap tests. Testing can be stopped between 65 and 70 years with 3 consecutive normal Pap tests and no abnormal Pap or HPV tests in the past 10 years.  HPV screening.** / Every 3 years from ages 71 years through ages 19 or 96 years with a history of 3 consecutive normal Pap tests. Testing can be stopped between 65 and 70 years with 3 consecutive normal Pap tests and no abnormal Pap or HPV tests in the past 10 years.  Fecal occult blood test (FOBT) of stool. / Every year beginning at age 59 years and continuing until age 7 years. You may not need to do this test if you get a colonoscopy every 10 years.  Flexible sigmoidoscopy or colonoscopy.** /  Every 5 years for a flexible sigmoidoscopy or every 10 years for a colonoscopy beginning at age 28 years and continuing until age 27 years.  Hepatitis C blood test.** / For all people born from [birth year] through 1963 and any individual with known risks for hepatitis C.  Osteoporosis screening.** / A one-time screening for women ages 6 years and over and women at risk for fractures or osteoporosis.  Skin self-exam. / Monthly.  Influenza vaccine. / Every year.  Tetanus, diphtheria, and acellular pertussis (Tdap/Td) vaccine.** / 1 dose of Td every 10 years.  Varicella vaccine.** / Consult your health care provider.  Zoster vaccine.** / 1 dose for adults aged 20 years or older.  Pneumococcal 13-valent conjugate (PCV13) vaccine.** / Consult your health care provider.  Pneumococcal polysaccharide (PPSV23) vaccine.** / 1 dose for all adults aged 95 years and older.  Meningococcal vaccine.** / Consult your health care provider.  Hepatitis A vaccine.** / Consult your health care provider.  Hepatitis B vaccine.** / Consult your health care provider.  Haemophilus influenzae type b (Hib) vaccine.** / Consult your health care provider. ** Family history and personal history of risk and conditions may change your health care provider's recommendations. Document Released: 07/06/2001 Document Revised: 02/28/2013  [facility] Patient Information 2014 [place], [place].   EXERCISE AND DIET:  We recommended that you start or continue a regular exercise program for good health. Regular exercise means any activity that makes your heart beat faster and makes you sweat.  We recommend exercising at least 30 minutes per day at least 3 days a week, preferably 5.  We also recommend a diet low in fat and sugar / carbohydrates.  Inactivity, poor dietary choices and obesity can cause diabetes, heart attack, stroke, and kidney damage, among others.     ALCOHOL AND SMOKING:  Women should limit their alcohol intake to no  more than 7 drinks/beers/glasses of wine (combined, not each!) per week. Moderation of alcohol intake to this level decreases your risk of breast cancer and liver damage.  ( And of course, no recreational drugs are part of a healthy lifestyle.)  Also, you should not be smoking at all or even being exposed to second hand smoke. Most people know smoking can  cause cancer, and various heart and lung diseases, but did you know it also contributes to weakening of your bones?  Aging of your skin?  Yellowing of your teeth and nails?   CALCIUM AND VITAMIN D:  Adequate intake of calcium and Vitamin D are recommended.  The recommendations for exact amounts of these supplements seem to change often, but generally speaking 600 mg of calcium (either carbonate or citrate) and 800 units of Vitamin D per day seems prudent. Certain women may benefit from higher intake of Vitamin D.  If you are among these women, your doctor will have told you during your visit.     PAP SMEARS:  Pap smears, to check for cervical cancer or precancers,  have traditionally been done yearly, although recent scientific advances have shown that most women can have pap smears less often.  However, every woman still should have a physical exam from her gynecologist or primary care physician every year. It will include a breast check, inspection of the vulva and vagina to check for abnormal growths or skin changes, a visual exam of the cervix, and then an exam to evaluate the size and shape of the uterus and ovaries.  And after 56 years of age, a rectal exam is indicated to check for rectal cancers. We will also provide age appropriate advice regarding health maintenance, like when you should have certain vaccines, screening for sexually transmitted diseases, bone density testing, colonoscopy, mammograms, etc.    MAMMOGRAMS:  All women over 40 years old should have a yearly mammogram. Many facilities now offer a "3D" mammogram, which may cost  around $50 extra out of pocket. If possible,  we recommend you accept the option to have the 3D mammogram performed.  It both reduces the number of women who will be called back for extra views which then turn out to be normal, and it is better than the routine mammogram at detecting truly abnormal areas.     COLONOSCOPY:  Colonoscopy to screen for colon cancer is recommended for all women at age 50.  We know, you hate the idea of the prep.  We agree, BUT, having colon cancer and not knowing it is worse!!  Colon cancer so often starts as a polyp that can be seen and removed at colonscopy, which can quite literally save your life!  And if your first colonoscopy is normal and you have no family history of colon cancer, most women don't have to have it again for 10 years.  Once every ten years, you can do something that may end up saving your life, right?  We will be happy to help you get it scheduled when you are ready.  Be sure to check your insurance coverage so you understand how much it will cost.  It may be covered as a preventative service at no cost, but you should check your particular policy.     Please realize, EXERCISE IS MEDICINE!  -  American Heart Association ( AHA) guidelines for exercise : If you are in good health, without any medical conditions, you should engage in 150-300 minutes of moderate intensity aerobic activity per week.  This means you should be huffing and puffing throughout your workout.   Engaging in regular exercise will improve brain function and memory, as well as improve mood, boost immune system and help with weight management.  As well as the other, more well-known effects of exercise such as decreasing blood sugar levels, decreasing blood pressure,  and decreasing   bad cholesterol levels/ increasing good cholesterol levels.     -  The AHA strongly endorses consumption of a diet that contains a variety of foods from all the food categories with an emphasis on fruits and  vegetables; fat-free and low-fat dairy products; cereal and grain products; legumes and nuts; and fish, poultry, and/or extra lean meats.    Excessive food intake, especially of foods high in saturated and trans fats, sugar, and salt, should be avoided.    Adequate water intake of roughly 1/2 of your weight in pounds, should equal the ounces of water per day you should drink.  So for instance, if you're 200 pounds, that would be 100 ounces of water per day.         Mediterranean Diet  Why follow it? Research shows. . Those who follow the Mediterranean diet have a reduced risk of heart disease  . The diet is associated with a reduced incidence of Parkinson's and Alzheimer's diseases . People following the diet may have longer life expectancies and lower rates of chronic diseases  . The Dietary Guidelines for Americans recommends the Mediterranean diet as an eating plan to promote health and prevent disease  What Is the Mediterranean Diet?  . Healthy eating plan based on typical foods and recipes of Mediterranean-style cooking . The diet is primarily a plant based diet; these foods should make up a majority of meals   Starches - Plant based foods should make up a majority of meals - They are an important sources of vitamins, minerals, energy, antioxidants, and fiber - Choose whole grains, foods high in fiber and minimally processed items  - Typical grain sources include wheat, oats, barley, corn, brown rice, bulgar, farro, millet, polenta, couscous  - Various types of beans include chickpeas, lentils, fava beans, black beans, white beans   Fruits  Veggies - Large quantities of antioxidant rich fruits & veggies; 6 or more servings  - Vegetables can be eaten raw or lightly drizzled with oil and cooked  - Vegetables common to the traditional Mediterranean Diet include: artichokes, arugula, beets, broccoli, brussel sprouts, cabbage, carrots, celery, collard greens, cucumbers, eggplant, kale, leeks,  lemons, lettuce, mushrooms, okra, onions, peas, peppers, potatoes, pumpkin, radishes, rutabaga, shallots, spinach, sweet potatoes, turnips, zucchini - Fruits common to the Mediterranean Diet include: apples, apricots, avocados, cherries, clementines, dates, figs, grapefruits, grapes, melons, nectarines, oranges, peaches, pears, pomegranates, strawberries, tangerines  Fats - Replace butter and margarine with healthy oils, such as olive oil, canola oil, and tahini  - Limit nuts to no more than a handful a day  - Nuts include walnuts, almonds, pecans, pistachios, pine nuts  - Limit or avoid candied, honey roasted or heavily salted nuts - Olives are central to the Mediterranean diet - can be eaten whole or used in a variety of dishes   Meats Protein - Limiting red meat: no more than a few times a month - When eating red meat: choose lean cuts and keep the portion to the size of deck of cards - Eggs: approx. 0 to 4 times a week  - Fish and lean poultry: at least 2 a week  - Healthy protein sources include, chicken, turkey, lean beef, lamb - Increase intake of seafood such as tuna, salmon, trout, mackerel, shrimp, scallops - Avoid or limit high fat processed meats such as sausage and bacon  Dairy - Include moderate amounts of low fat dairy products  - Focus on healthy dairy such as fat free yogurt, skim   or reduced fat cheese - Limit dairy products higher in fat such as whole or 2% milk, cheese, ice cream  Alcohol - Moderate amounts of red wine is ok  - No more than 5 oz daily for women (all ages) and men older than age 74  - No more than 10 oz of wine daily for men younger than 51  Other - Limit sweets and other desserts  - Use herbs and spices instead of salt to flavor foods  - Herbs and spices common to the traditional Mediterranean Diet include: basil, bay leaves, chives, cloves, cumin, fennel, garlic, lavender, marjoram, mint, oregano, parsley, pepper, rosemary, sage, savory, sumac,  tarragon, thyme   Its not just a diet, its a lifestyle:   The Mediterranean diet includes lifestyle factors typical of those in the region   Foods, drinks and meals are best eaten with others and savored  Daily physical activity is important for overall good health  This could be strenuous exercise like running and aerobics  This could also be more leisurely activities such as walking, housework, yard-work, or taking the stairs  Moderation is the key; a balanced and healthy diet accommodates most foods and drinks  Consider portion sizes and frequency of consumption of certain foods   Meal Ideas & Options:   Breakfast:  o Whole wheat toast or whole wheat English muffins with peanut butter & hard boiled egg o Steel cut oats topped with apples & cinnamon and skim milk  o Fresh fruit: banana, strawberries, melon, berries, peaches  o Smoothies: strawberries, bananas, greek yogurt, peanut butter o Low fat greek yogurt with blueberries and granola  o Egg white omelet with spinach and mushrooms o Breakfast couscous: whole wheat couscous, apricots, skim milk, cranberries   Sandwiches:  o Hummus and grilled vegetables (peppers, zucchini, squash) on whole wheat bread   o Grilled chicken on whole wheat pita with lettuce, tomatoes, cucumbers or tzatziki  o Tuna salad on whole wheat bread: tuna salad made with greek yogurt, olives, red peppers, capers, green onions o Garlic rosemary lamb pita: lamb sauted with garlic, rosemary, salt & pepper; add lettuce, cucumber, greek yogurt to pita - flavor with lemon juice and black pepper   Seafood:  o Mediterranean grilled salmon, seasoned with garlic, basil, parsley, lemon juice and black pepper o Shrimp, lemon, and spinach whole-grain pasta salad made with low fat greek yogurt  o Seared scallops with lemon orzo  o Seared tuna steaks seasoned salt, pepper, coriander topped with tomato mixture of olives, tomatoes, olive oil, minced garlic, parsley,  green onions and cappers   Meats:  o Herbed greek chicken salad with kalamata olives, cucumber, feta  o Red bell peppers stuffed with spinach, bulgur, lean ground beef (or lentils) & topped with feta   o Kebabs: skewers of chicken, tomatoes, onions, zucchini, squash  o [place] burgers: made with red onions, mint, dill, lemon juice, feta cheese topped with roasted red peppers  Vegetarian o Cucumber salad: cucumbers, artichoke hearts, celery, red onion, feta cheese, tossed in olive oil & lemon juice  o Hummus and whole grain pita points with a greek salad (lettuce, tomato, feta, olives, cucumbers, red onion) o Lentil soup with celery, carrots made with vegetable broth, garlic, salt and pepper  o Tabouli salad: parsley, bulgur, mint, scallions, cucumbers, tomato, radishes, lemon juice, olive oil, salt and pepper.

## 2019-03-07 NOTE — Telephone Encounter (Signed)
Please advise 

## 2019-03-07 NOTE — Progress Notes (Signed)
Impression and Recommendations:    1. Encounter for general adult medical examination with abnormal findings   2. Health education/counseling   3. HTN (hypertension) with goal to be determined   4. Vitamin D deficiency   5. Screening for condition     - Discussed need for patient to return every six months for chronic follow-up.  1) Anticipatory Guidance: Discussed importance of wearing a seatbelt while driving, not texting while driving; sunscreen when outside along with yearly skin surveillance; eating a well balanced and modest diet; physical activity at least 25 minutes per day or 150 min/ week of moderate to intense activity.  - Prudent self skin surveillance discussed with patient today.  - Given family history of ovarian cancer in sister at age 92, advised patient to continue to follow up with OBGYN for yearly screening as recommended.  2) Immunizations / Screenings / Labs:   All immunizations and screenings that patient agrees to, are up-to-date per recommendations or will be updated today.  Patient understands the needs for q 68modental and yearly vision screens which pt will schedule independently. Obtain CBC, CMP, HgA1c, Lipid panel, TSH and vit D when fasting if not already done recently.   - Discussed recommendations as provided by national guidelines.  Recent Health Screening at Work - Patient had health screenings/labs done at work 01/12/2019. - Blood pressure at that time was 160/100 - A1c was 4.9 - Serum creatinine was 0.86 - Triglycerides = 139 - HDL = 47 - LDL = 83  - Will obtain additional labs PRN.  Advised need for recommended screening (such as thyroid, vitamin D, liver enzymes) to evaluate abnormalities.  Lengthy conversation held with patient today.  - Discussed need for prudent Hep C screening.  - Discussed need for shingles vaccine as recommended. - Patient wishes to wait on immunizations and call in future to schedule CMA-only visit.  -  Extensive education provided and all questions answered.  3) Elevated Blood Pressure - Patient states that this is being managed by her gynecological provider. - Discussed meaning of hypertension and that normal BP is under 120/80 or less.   4) Weight & BMI Counseling - Body mass index is 31.27 kg/m Discussed goal of losing even 5-10% of current body weight which would improve overall feelings of well being and improve objective health data significantly.   Improve nutrient density of diet through increasing intake of fruits and vegetables and decreasing saturated/trans fats, white flour products and refined sugar products.   Explained to patient what BMI refers to, and what it means medically.    Told patient to think about it as a "medical risk stratification measurement" and how increasing BMI is associated with increasing risk/ or worsening state of various diseases such as hypertension, hyperlipidemia, diabetes, premature OA, depression etc.  American Heart Association guidelines for healthy diet, basically Mediterranean diet, and exercise guidelines of 30 minutes 5 days per week or more discussed in detail.  Health counseling performed.  All questions answered.  5) [place]patient to continue working toward exercising to improve overall mental, physical, and emotional health.    - Reviewed the "spokes of the wheel" of mood and health management.  Stressed the importance of ongoing prudent habits, including regular exercise, appropriate sleep hygiene, healthful dietary habits, and prayer/meditation to relax.  - Encouraged patient to engage in daily physical activity as tolerated, especially a formal exercise routine.  Recommended that the patient eventually strive  for at least 150 minutes of moderate cardiovascular activity per week according to guidelines established by the [facility].   - Healthy dietary habits encouraged, including  low-carb, and high amounts of lean protein in diet.   - Patient should also consume adequate amounts of water.   Orders Placed This Encounter  Procedures  . Hepatitis C Antibody  . TSH  . T4, free  . VITAMIN D 25 Hydroxy (Vit-D Deficiency, Fractures)    Return for 55mof/up .   Gross side effects, risk and benefits, and alternatives of medications discussed with patient.  Patient is aware that all medications have potential side effects and we are unable to predict every side effect or drug-drug interaction that may occur.  Expresses verbal understanding and consents to current therapy plan and treatment regimen.  F-up preventative CPE in 1 year- reminded pt again, this is in addition to any chronic care visits.    Please see orders placed and AVS handed out to patient at the end of our visit for further patient instructions/ counseling done pertaining to today's office visit.  This document serves as a record of services personally performed by DMellody Dance DO. It was created on her behalf by KToni Amend a trained medical scribe. The creation of this record is based on the scribe's personal observations and the provider's statements to them.   I have reviewed the above medical documentation for accuracy and completeness and I concur.  DMellody Dance DO 03/07/2019 6:19 PM       Subjective:    Chief Complaint  Patient presents with  . Annual Exam    HPI: Amber NOGAis a 58y.o. female [facility] [place].  Health Maintenance Summary Reviewed and updated, unless pt declines services.  Colonoscopy:  Last obtained in 2016, up to date. Denies known family history of colon cancer. Tobacco History Reviewed:  Y; never smoker.  Alcohol:  No concerns, no excessive use. Exercise Habits:  Not meeting AHA guidelines. STD concerns:  None. Drug Use:  None. Birth control method:  Managed by  [facility] Menses regular:  Managed by OBGYN. Lumps or breast concerns:  No. Breast Cancer Family History:  No.  Patient's sister had ovarian cancer.  States she has been taking 5000 Vitamin D daily.  - Hypertension Notes her blood pressure is being managed by her OBGYN.  - Female Health Follows up with OBGYN.  Obtains yearly mammograms and pap smears yearly.  - Eye Health Goes to receive eye exam yearly.  - Dental Health Typically obtains dental work every six months.  - [facility].  Confirms that she wears sunscreen.    Immunization History  Administered Date(s) Administered  . Influenza,inj,Quad PF,6+ Mos 03/23/2017    Health Maintenance  Topic Date Due  . Hepatitis C Screening  0January 27, 1962 . HIV Screening  11/20/1977  . TETANUS/TDAP  11/20/1981  . INFLUENZA VACCINE  12/23/2018  . MAMMOGRAM  02/08/2020  . PAP SMEAR-Modifier  12/19/2020  . COLONOSCOPY  11/19/2024     Wt Readings from Last 3 Encounters:  03/07/19 187 lb 14.4 oz (85.2 kg)  02/23/19 186 lb (84.4 kg)  01/25/19 185 lb (83.9 kg)   BP Readings from Last 3 Encounters:  03/07/19 (!) 136/93  02/23/19 (!) 140/100  01/25/19 140/90   Pulse Readings from Last 3 Encounters:  03/07/19 72  02/14/18 71  12/19/17 66     Past Medical History:  Diagnosis Date  . Eczema   . Family history of ovarian cancer 10/18/2014   MyRisk neg; sister  . 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 Hemorrhoids 2016   REM  by DR. BYRNETT c colonoscopy  . History of mammogram 10/18/14; 11/05/15   scattered fibroglandular densities, saw surgeon; incomplete-add views - stable;  . History of Papanicolaou smear of cervix 10/18/2014   -/-  . Hypertension   . Psoriasis   . Shingles 09/2015      Past Surgical History:  Procedure Laterality Date  . BREAST BIOPSY Left 10/2014   negative core  . COLONOSCOPY N/A 11/20/2014    Procedure: COLONOSCOPY;  Surgeon: Robert Bellow, MD;  Location: [facility] ENDOSCOPY;  Service: Endoscopy;  Laterality: N/A;  . HEMORRHOID SURGERY N/A 11/20/2014   Procedure: HEMORRHOIDECTOMY;  Surgeon: Robert Bellow, MD;  Location: [facility] ORS;  Service: General;  Laterality: N/A;      Family History  Problem Relation Age of Onset  . Hypertension Mother 72  . Colonic polyp Mother   . Diabetes Father 81       TYPE 2  . Hypertension Father 7  . Heart attack Father   . Cancer Father 53       SKIN CA IN SITU  . Cancer Sister 55       ovarian, CERVICAL  . Heart attack Brother   . Hypertension Brother   . Colon cancer Brother 35  . Breast cancer Neg Hx       Social History   Substance and Sexual Activity  Drug Use No  ,   Social History   Substance and Sexual Activity  Alcohol Use No  ,   Social History   Tobacco Use  Smoking Status Never Smoker  Smokeless Tobacco Never Used  ,   Social History   Substance and Sexual Activity  Sexual Activity Yes  . Birth control/protection: None    Current Outpatient Medications on File Prior to Visit  Medication Sig Dispense Refill  . ERRIN 0.35 MG tablet TAKE 1 TABLET BY MOUTH  DAILY 84 tablet 0  . losartan (COZAAR) 50 MG tablet Take 1 tablet (50 mg total) by mouth daily. 15 tablet 0  . meloxicam (MOBIC) 15 MG tablet meloxicam 15 mg tablet  TK 1 T PO QD    . Multiple Vitamin (MULTIVITAMIN) capsule Take 1 capsule by mouth daily.    . Omega-3 Fatty Acids (FISH OIL PO) Take 1 capsule by mouth daily.    Marland Kitchen OVER THE COUNTER MEDICATION Bone nutrient    . Vitamin D, Ergocalciferol, (DRISDOL) 50000 units CAPS capsule TAKE 1 CAPSULE BY MOUTH  EVERY 7 DAYS 12 capsule 3   No current facility-administered medications on file prior to visit.     Allergies: Patient has no known allergies.  Review of Systems: General:   Denies fever, chills, unexplained weight loss.  Optho/Auditory:   Denies visual changes, blurred vision/LOV  Respiratory:   Denies SOB, DOE more than baseline levels.   Cardiovascular:   Denies chest pain, palpitations, new onset peripheral edema  Gastrointestinal:   Denies nausea, vomiting, diarrhea.  Genitourinary: Denies dysuria, freq/ urgency, flank pain or discharge from genitals.  Endocrine:     Denies hot or cold intolerance, polyuria, polydipsia. Musculoskeletal:   Denies unexplained myalgias, joint swelling, unexplained arthralgias, gait problems.  Skin:  Denies rash, suspicious lesions Neurological:  Denies dizziness, unexplained weakness, numbness  Psychiatric/Behavioral:   Denies mood changes, suicidal or homicidal ideations, hallucinations    Objective:    Blood pressure (!) 136/93, pulse 72, temperature 97.7 F (36.5 C), resp. rate 16, height _0  (1.651 m), weight 187 lb 14.4 oz (85.2 kg), SpO2 100 %. Body mass index is 31.27 kg/m. General Appearance:    Alert, cooperative, no distress, appears stated age  Head:    Normocephalic, without obvious abnormality, atraumatic  Eyes:    PERRL, conjunctiva/corneas clear, EOM's intact, fundi    benign, both eyes  Ears:    Normal TM's and external ear canals, both ears  Nose:   Nares normal, septum midline, mucosa normal, no drainage    or sinus tenderness  Throat:   Lips w/o lesion, mucosa moist, and tongue normal; teeth and   gums normal  Neck:   Supple, symmetrical, trachea midline, no adenopathy;    thyroid:  no enlargement/tenderness/nodules; no carotid   bruit or JVD  Back:     Symmetric, no curvature, ROM normal, no CVA tenderness  Lungs:     Clear to auscultation bilaterally, respirations unlabored, no       Wh/ R/ R  Chest Wall:    No tenderness or gross deformity; normal excursion   Heart:    Regular rate and rhythm, S1 and S2 normal, no murmur, rub   or gallop  Breast Exam:    Deferred to OBGYN.  Abdomen:     Soft, non-tender, bowel sounds active all four quadrants, NO   G/R/R, no masses, no organomegaly  Genitalia:     Deferred to OBGYN.  Rectal:    Deferred to OBGYN.  Extremities:   Extremities normal, atraumatic, no cyanosis or gross edema  Pulses:   2+ and symmetric all extremities  Skin:   Warm, dry, Skin color, texture, turgor normal, no obvious rashes or lesions Psych: No HI/SI, judgement and insight good, Euthymic mood. Full Affect.  Neurologic:   CNII-XII intact, normal strength, sensation and reflexes    Throughout

## 2019-03-08 ENCOUNTER — Encounter: Payer: Self-pay | Admitting: Obstetrics and Gynecology

## 2019-03-08 ENCOUNTER — Other Ambulatory Visit: Payer: Self-pay | Admitting: Obstetrics and Gynecology

## 2019-03-08 DIAGNOSIS — R928 Other abnormal and inconclusive findings on diagnostic imaging of breast: Secondary | ICD-10-CM

## 2019-03-08 DIAGNOSIS — N6310 Unspecified lump in the right breast, unspecified quadrant: Secondary | ICD-10-CM

## 2019-03-08 LAB — VITAMIN D 25 HYDROXY (VIT D DEFICIENCY, FRACTURES): Vit D, 25-Hydroxy: 19.2 ng/mL — ABNORMAL LOW (ref 30.0–100.0)

## 2019-03-08 LAB — T4, FREE: Free T4: 1.34 ng/dL (ref 0.82–1.77)

## 2019-03-08 LAB — TSH: TSH: 2.07 u[IU]/mL (ref 0.450–4.500)

## 2019-03-08 LAB — HEPATITIS C ANTIBODY: Hep C Virus Ab: <0.1 s/co ratio (ref 0.0–0.9)

## 2019-03-09 ENCOUNTER — Ambulatory Visit (INDEPENDENT_AMBULATORY_CARE_PROVIDER_SITE_OTHER): Payer: BC Managed Care – PPO | Admitting: Obstetrics & Gynecology

## 2019-03-09 ENCOUNTER — Encounter: Payer: Self-pay | Admitting: Obstetrics & Gynecology

## 2019-03-09 ENCOUNTER — Other Ambulatory Visit: Payer: Self-pay

## 2019-03-09 VITALS — BP 140/98 | Ht 65.0 in | Wt 187.0 lb

## 2019-03-09 DIAGNOSIS — N814 Uterovaginal prolapse, unspecified: Secondary | ICD-10-CM

## 2019-03-09 NOTE — Patient Instructions (Signed)
Vaginal Hysterectomy  A vaginal hysterectomy is a procedure to remove all or part of the uterus through a small incision in the vagina. In this procedure, your health care provider may remove your entire uterus, including the lower end (cervix). You may need a vaginal hysterectomy to treat:  Uterine fibroids.  A condition that causes the lining of the uterus to grow in other areas (endometriosis).  Problems with pelvic support.  Cancer of the cervix, ovaries, uterus, or tissue that lines the uterus (endometrium).  Excessive (dysfunctional) uterine bleeding. When removing your uterus, your health care provider may also remove the organs that produce eggs (ovaries) and the tubes that carry eggs to your uterus (fallopian tubes). After a vaginal hysterectomy, you will no longer be able to have a baby. You will also no longer get your menstrual period. Tell a health care provider about:  Any allergies you have.  All medicines you are taking, including vitamins, herbs, eye drops, creams, and over-the-counter medicines.  Any problems you or family members have had with anesthetic medicines.  Any blood disorders you have.  Any surgeries you have had.  Any medical conditions you have.  Whether you are pregnant or may be pregnant. What are the risks? Generally, this is a safe procedure. However, problems may occur, including:  Bleeding.  Infection.  A blood clot that forms in your leg and travels to your lungs (pulmonary embolism).  Damage to surrounding organs.  Pain during sex. What happens before the procedure?  Ask your health care provider what organs will be removed during surgery.  Ask your health care provider about: ? Changing or stopping your regular medicines. This is especially important if you are taking diabetes medicines or blood thinners. ? Taking medicines such as aspirin and ibuprofen. These medicines can thin your blood. Do not take these medicines before your  procedure if your health care provider instructs you not to.  Follow instructions from your health care provider about eating or drinking restrictions.  Do not use any tobacco products, such as cigarettes, chewing tobacco, and e-cigarettes. If you need help quitting, ask your health care provider.  Plan to have someone take you home after discharge from the hospital. What happens during the procedure?  To reduce your risk of infection: ? Your health care team will wash or sanitize their hands. ? Your skin will be washed with soap.  An IV tube will be inserted into one of your veins.  You may be given antibiotic medicine to help prevent infection.  You will be given one or more of the following: ? A medicine to help you relax (sedative). ? A medicine to numb the area (local anesthetic). ? A medicine to make you fall asleep (general anesthetic). ? A medicine that is injected into an area of your body to numb everything beyond the injection site (regional anesthetic).  Your surgeon will make an incision in your vagina.  Your surgeon will locate and remove all or part of your uterus.  Your ovaries and fallopian tubes may be removed at the same time.  The incision will be closed with stitches (sutures) that dissolve over time. The procedure may vary among health care providers and hospitals. What happens after the procedure?  Your blood pressure, heart rate, breathing rate, and blood oxygen level will be monitored often until the medicines you were given have worn off.  You will be encouraged to get up and walk around after a few hours to help prevent complications.  You may have IV tubes in place for a few days.  You will be given pain medicine as needed.  Do not drive for 24 hours if you were given a sedative. This information is not intended to replace advice given to you by your health care provider. Make sure you discuss any questions you have with your health care provider.  Document Released: 09/01/2015 Document Revised: 01/01/2016 Document Reviewed: 05/25/2015 Elsevier Patient Education  2020 Elsevier Inc.  

## 2019-03-09 NOTE — Progress Notes (Signed)
Patient is a 58 yo G2P2 WF who complains of a feelings of uterine prolapse. Problem started 2 years ago. Symptoms include: prolapse of tissue with straining and no incontinence, urgency, nocturia, or frequency. Symptoms have gradually worsened.  No interference w sex but does feel it there with sex.  Newly re-married and has positive sex life.  Still has periods (last one Aug; sporadic).  No sx's of menopause such as hot flashes.  No prior gyne surgery.  Vag delivery x2 30 and 34 yrs ago.  PMHx: She  has a past medical history of Eczema, Family history of ovarian cancer (10/18/2014), Genetic testing of female (09/2014), Hemorrhoids (2016), History of mammogram (10/18/14; 11/05/15), History of Papanicolaou smear of cervix (10/18/2014), Hypertension, Psoriasis, and Shingles (09/2015). Also,  has a past surgical history that includes Hemorrhoid surgery (N/A, 11/20/2014); Colonoscopy (N/A, 11/20/2014); and Breast biopsy (Left, 10/2014)., family history includes Cancer (age of onset: 42) in her sister; Cancer (age of onset: 18) in her father; Colon cancer (age of onset: 3) in her brother; Colonic polyp in her mother; Diabetes (age of onset: 55) in her father; Heart attack in her brother and father; Hypertension in her brother; Hypertension (age of onset: 51) in her mother; Hypertension (age of onset: 60) in her father.,  reports that she has never smoked. She has never used smokeless tobacco. She reports that she does not drink alcohol or use drugs.  She has a current medication list which includes the following prescription(s): errin, losartan, meloxicam, multivitamin, omega-3 fatty acids, OVER THE COUNTER MEDICATION, and vitamin d (ergocalciferol). Also, has No Known Allergies.  Review of Systems  Constitutional: Negative for chills, fever and malaise/fatigue.  HENT: Negative for congestion, sinus pain and sore throat.   Eyes: Negative for blurred vision and pain.  Respiratory: Negative for cough and  wheezing.   Cardiovascular: Negative for chest pain and leg swelling.  Gastrointestinal: Negative for abdominal pain, constipation, diarrhea, heartburn, nausea and vomiting.  Genitourinary: Negative for dysuria, frequency, hematuria and urgency.  Musculoskeletal: Negative for back pain, joint pain, myalgias and neck pain.  Skin: Negative for itching and rash.  Neurological: Negative for dizziness, tremors and weakness.  Endo/Heme/Allergies: Does not bruise/bleed easily.  Psychiatric/Behavioral: Negative for depression. The patient is not nervous/anxious and does not have insomnia.     Objective: BP (!) 140/98   Ht 5\' 5"  (1.651 m)   Wt 187 lb (84.8 kg)   BMI 31.12 kg/m  Physical Exam Constitutional:      General: She is not in acute distress.    Appearance: She is well-developed.  Genitourinary:     Pelvic exam was performed with patient supine.     Vagina and uterus normal.     No vaginal erythema or bleeding.     No cervical motion tenderness, discharge, polyp or nabothian cyst.     Uterus is mobile.     Uterus is not enlarged.     No uterine mass detected.    Uterus is midaxial.     No right or left adnexal mass present.     Right adnexa not tender.     Left adnexa not tender.     Genitourinary Comments: Uterus prolapses to beyond introitus on valsalva or cough Urethral mobility, no LOU Gr 1 cystocele No rectocele  HENT:     Head: Normocephalic and atraumatic.     Nose: Nose normal.  Abdominal:     General: There is no distension.     Palpations:  Abdomen is soft.     Tenderness: There is no abdominal tenderness.  Musculoskeletal: Normal range of motion.  Neurological:     Mental Status: She is alert and oriented to person, place, and time.     Cranial Nerves: No cranial nerve deficit.  Skin:    General: Skin is warm and dry.  Psychiatric:        Attention and Perception: Attention normal.        Mood and Affect: Mood and affect normal.        Speech: Speech  normal.        Behavior: Behavior normal.        Thought Content: Thought content normal.        Judgment: Judgment normal.     ASSESSMENT/PLAN:   Problem List Items Addressed This Visit      Genitourinary   Uterine prolapse - Primary    Mild sx's.  Risks and expected gradual worsening discussed. Options for treatment discussed. Pessary pros and cons, as well as TVH.  Discussed ovarian preservation options vs removal. (Lean towards preservation as is still perimenopausal) Pt is gv info and will consider at a later time as sx's are minimal and risks low at this time.  A total of 25 minutes were spent face-to-face with the patient during this encounter and over half of that time dealt with counseling and coordination of care.  Barnett Applebaum, MD, [facility], [facility] 03/09/2019  8:57 AM

## 2019-03-12 ENCOUNTER — Other Ambulatory Visit: Payer: Self-pay | Admitting: Obstetrics and Gynecology

## 2019-03-12 DIAGNOSIS — I10 Essential (primary) hypertension: Secondary | ICD-10-CM

## 2019-03-12 MED ORDER — LOSARTAN POTASSIUM-HCTZ 50-12.5 MG PO TABS: 1.0000 | ORAL_TABLET | Freq: Every day | ORAL | 0 refills | Status: DC

## 2019-03-12 NOTE — Progress Notes (Signed)
Pt's BP not improved with losartan 50 mg dose (up from 25 mg dose 9/20). Was 140/98 at 03/09/19 BP f/u. BP only slightly less at home. No side effects. Will add HCTZ 12.5 mg to current med. Rx eRxd. Pt to RTO for nurse visit/BP check in 4 wks. Pt to cont to keep BP journal at home. F/u sooner prn.

## 2019-03-12 NOTE — Telephone Encounter (Signed)
This encounter was created in error - please disregard.

## 2019-03-16 ENCOUNTER — Other Ambulatory Visit: Payer: Self-pay | Admitting: Obstetrics and Gynecology

## 2019-03-16 DIAGNOSIS — Z308 Encounter for other contraceptive management: Secondary | ICD-10-CM

## 2019-03-22 ENCOUNTER — Ambulatory Visit
Admission: RE | Admit: 2019-03-22 | Discharge: 2019-03-22 | Disposition: A | Discharge status: Home / Self Care | Payer: BC Managed Care – PPO | Source: Ambulatory Visit | Attending: Obstetrics and Gynecology | Admitting: Obstetrics and Gynecology

## 2019-03-22 DIAGNOSIS — N6310 Unspecified lump in the right breast, unspecified quadrant: Secondary | ICD-10-CM

## 2019-03-22 DIAGNOSIS — N6011 Diffuse cystic mastopathy of right breast: Secondary | ICD-10-CM | POA: Diagnosis not present

## 2019-03-22 DIAGNOSIS — R928 Other abnormal and inconclusive findings on diagnostic imaging of breast: Secondary | ICD-10-CM

## 2019-04-09 NOTE — Progress Notes (Signed)
Amber Dance, DO   Chief Complaint  Patient presents with  . Blood Pressure Check    HPI:      [name] is a 58 y.o. [phone] who LMP was No LMP recorded. (Menstrual status: Oral contraceptives)., presents today for BP f/u. Started losartan 50 mg/HCTZ 12.5 mg 03/12/19 after BP not controlled with losartan 25 mg and 50 mg doses. Pt taking BP at home and ranges from 118/125/88-90. No side effects. No headaches, vision changes, lightheadedness. Was running about 140/90. Last annual 9/20   Patient Active Problem List   Diagnosis Date Noted  . Uterine prolapse 03/09/2019  . Essential hypertension 06/09/2017  . Pelvic floor instability 06/09/2017  . Conjunctivitis of left eye 11/01/2016  . Vitamin D deficiency 03/28/2016  . Hypertriglyceridemia 03/28/2016  . Hypertension 01/10/2016  . Mass of right breast on mammogram 01/10/2016  . Diffuse cystic mastopathy of both breasts (R>L) 01/10/2016  . Family history of diabetes mellitus-father 01/10/2016  . Family history of ovarian cancer- sister age 77 01/10/2016  . Family history of essential hypertension- in 34s and early 50s 01/10/2016  . Family history of early CAD 01/05/2016  . Overweight (BMI 25.0-29.9) 01/05/2016  . History of shingles 01/05/2016  . h/o Hemorrhoid- sx repair 6/16 11/01/2014    Past Surgical History:  Procedure Laterality Date  . BREAST BIOPSY Left 10/2014   negative core  . COLONOSCOPY N/A 11/20/2014   Procedure: COLONOSCOPY;  Surgeon: Robert Bellow, MD;  Location: [facility] ENDOSCOPY;  Service: Endoscopy;  Laterality: N/A;  . HEMORRHOID SURGERY N/A 11/20/2014   Procedure: HEMORRHOIDECTOMY;  Surgeon: Robert Bellow, MD;  Location: [facility] ORS;  Service: General;  Laterality: N/A;    Family History  Problem Relation Age of Onset  . Hypertension Mother 65  . Colonic polyp Mother   . Diabetes Father 18       TYPE 2  . Hypertension Father 80  . Heart attack Father   . Cancer Father 68       SKIN  CA IN SITU  . Cancer Sister 34       ovarian, CERVICAL  . Heart attack Brother   . Hypertension Brother   . Colon cancer Brother 66  . Breast cancer Neg Hx     Social History   Socioeconomic History  . Marital status: Widowed    Spouse name: Not on file  . Number of children: 2  . Years of education: 57  . Highest education level: Not on file  Occupational History  . Occupation: LEGAL SECRETARY  Social Needs  . Financial resource strain: Not on file  . Food insecurity    Worry: Not on file    Inability: Not on file  . Transportation needs    Medical: Not on file    Non-medical: Not on file  Tobacco Use  . Smoking status: Never Smoker  . Smokeless tobacco: Never Used  Substance and Sexual Activity  . Alcohol use: No  . Drug use: No  . Sexual activity: Yes    Birth control/protection: None  Lifestyle  . Physical activity    Days per week: 0 days    Minutes per session: Not on file  . Stress: Not on file  Relationships  . Social Herbalist on phone: Not on file    Gets together: Not on file    Attends religious service: Not on file    Active member of club or organization: Not  on file    Attends meetings of clubs or organizations: Not on file    Relationship status: Not on file  . Intimate partner violence    Fear of current or ex partner: Not on file    Emotionally abused: Not on file    Physically abused: Not on file    Forced sexual activity: Not on file  Other Topics Concern  . Not on file  Social History Narrative  . Not on file    Outpatient Medications Prior to Visit  Medication Sig Dispense Refill  . ERRIN 0.35 MG tablet TAKE 1 TABLET BY MOUTH  DAILY 84 tablet 3  . meloxicam (MOBIC) 15 MG tablet meloxicam 15 mg tablet  TK 1 T PO QD    . Multiple Vitamin (MULTIVITAMIN) capsule Take 1 capsule by mouth daily.    . Omega-3 Fatty Acids (FISH OIL PO) Take 1 capsule by mouth daily.    Marland Kitchen OVER THE COUNTER MEDICATION Bone nutrient    . Vitamin  D, Ergocalciferol, (DRISDOL) 50000 units CAPS capsule TAKE 1 CAPSULE BY MOUTH  EVERY 7 DAYS 12 capsule 3  . losartan-hydrochlorothiazide (HYZAAR) 50-12.5 MG tablet Take 1 tablet by mouth daily. 30 tablet 0   No facility-administered medications prior to visit.       ROS:  Review of Systems  Constitutional: Negative for fever.  Gastrointestinal: Negative for blood in stool, constipation, diarrhea, nausea and vomiting.  Genitourinary: Negative for dyspareunia, dysuria, flank pain, frequency, hematuria, urgency, vaginal bleeding, vaginal discharge and vaginal pain.  Musculoskeletal: Negative for back pain.  Skin: Negative for rash.  Neurological: Negative for dizziness, light-headedness and headaches.    OBJECTIVE:   Vitals:  BP 130/90   Ht 5\' 5"  (1.651 m)   Wt 191 lb (86.6 kg)   BMI 31.78 kg/m   Physical Exam Vitals signs reviewed.  Constitutional:      Appearance: She is well-developed.  Neck:     Musculoskeletal: Normal range of motion.  Pulmonary:     Effort: Pulmonary effort is normal.  Musculoskeletal: Normal range of motion.  Skin:    General: Skin is warm and dry.  Neurological:     General: No focal deficit present.     Mental Status: She is alert and oriented to person, place, and time.     Cranial Nerves: No cranial nerve deficit.  Psychiatric:        Mood and Affect: Mood normal.        Behavior: Behavior normal.        Thought Content: Thought content normal.        Judgment: Judgment normal.     Assessment/Plan: Essential hypertension - Plan: losartan-hydrochlorothiazide (HYZAAR) 50-12.5 MG tablet, DISCONTINUED: losartan-hydrochlorothiazide (HYZAAR) 50-12.5 MG tablet; Doing well on dose. BP controlled at home. Cont to monitor at home and f/u if >130/90. Rx RF to local pharm and [facility]. F/u at 9/21 annual, sooner if BP is elevated.    Meds ordered this encounter  Medications  . DISCONTD: losartan-hydrochlorothiazide (HYZAAR) 50-12.5 MG tablet    Sig:  Take 1 tablet by mouth daily.    Dispense:  30 tablet    Refill:  0    Order Specific Question:   Supervising Provider    Answer:   Gae Dry [id number]  . losartan-hydrochlorothiazide (HYZAAR) 50-12.5 MG tablet    Sig: Take 1 tablet by mouth daily.    Dispense:  90 tablet    Refill:  2    Order Specific  Question:   Supervising Provider    Answer:   Gae Dry [id number]      Return if symptoms worsen or fail to improve.  Kerianna Rawlinson B. Karsyn Rochin, PA-C 04/10/2019 11:48 AM

## 2019-04-10 ENCOUNTER — Other Ambulatory Visit: Payer: Self-pay

## 2019-04-10 ENCOUNTER — Encounter: Payer: Self-pay | Admitting: Obstetrics and Gynecology

## 2019-04-10 ENCOUNTER — Other Ambulatory Visit: Payer: Self-pay | Admitting: Obstetrics and Gynecology

## 2019-04-10 ENCOUNTER — Ambulatory Visit (INDEPENDENT_AMBULATORY_CARE_PROVIDER_SITE_OTHER): Payer: BC Managed Care – PPO | Admitting: Obstetrics and Gynecology

## 2019-04-10 DIAGNOSIS — I10 Essential (primary) hypertension: Secondary | ICD-10-CM

## 2019-04-10 MED ORDER — LOSARTAN POTASSIUM-HCTZ 50-12.5 MG PO TABS: 1.0000 | ORAL_TABLET | Freq: Every day | ORAL | 2 refills | Status: DC

## 2019-04-10 MED ORDER — LOSARTAN POTASSIUM-HCTZ 50-12.5 MG PO TABS: 1.0000 | ORAL_TABLET | Freq: Every day | ORAL | 0 refills | Status: DC

## 2019-04-10 NOTE — Patient Instructions (Signed)
I value your feedback and entrusting us with your care. If you get a [facility] patient survey, I would appreciate you taking the time to let us know about your experience today. Thank you! 

## 2019-04-30 ENCOUNTER — Other Ambulatory Visit: Payer: Self-pay

## 2019-04-30 DIAGNOSIS — Z20822 Contact with and (suspected) exposure to covid-19: Secondary | ICD-10-CM

## 2019-05-01 LAB — NOVEL CORONAVIRUS, NAA: SARS-CoV-2, NAA: NOT DETECTED

## 2019-08-22 ENCOUNTER — Other Ambulatory Visit: Payer: Self-pay | Admitting: Obstetrics and Gynecology

## 2019-08-22 DIAGNOSIS — N6310 Unspecified lump in the right breast, unspecified quadrant: Secondary | ICD-10-CM

## 2019-09-01 ENCOUNTER — Ambulatory Visit: Payer: Self-pay | Attending: Internal Medicine

## 2019-09-01 DIAGNOSIS — Z23 Encounter for immunization: Secondary | ICD-10-CM

## 2019-09-01 NOTE — Progress Notes (Signed)
   Covid-19 Vaccination Clinic  Name:  Amber Henson    MRN: [id number] DOB: 1960-09-20  09/01/2019  Amber Henson was observed post Covid-19 immunization for 15 minutes without incident. She was provided with Vaccine Information Sheet and instruction to access the V-Safe system.   Amber Henson was instructed to call 911 with any severe reactions post vaccine: Marland Kitchen Difficulty breathing  . Swelling of face and throat  . A fast heartbeat  . A bad rash all over body  . Dizziness and weakness   Immunizations Administered    Name Date Dose VIS Date Route   Pfizer COVID-19 Vaccine 09/01/2019  8:49 AM 0.3 mL 05/04/2019 Intramuscular   Manufacturer: [place]   Lot: [id number]   [place]: [id number]

## 2019-09-05 ENCOUNTER — Other Ambulatory Visit: Payer: Self-pay

## 2019-09-05 ENCOUNTER — Encounter: Payer: Self-pay | Admitting: Family Medicine

## 2019-09-05 ENCOUNTER — Ambulatory Visit (INDEPENDENT_AMBULATORY_CARE_PROVIDER_SITE_OTHER): Payer: Self-pay | Admitting: Family Medicine

## 2019-09-05 VITALS — BP 112/82 | HR 73 | Ht 65.0 in | Wt 184.8 lb

## 2019-09-05 DIAGNOSIS — R030 Elevated blood-pressure reading, without diagnosis of hypertension: Secondary | ICD-10-CM | POA: Insufficient documentation

## 2019-09-05 DIAGNOSIS — E669 Obesity, unspecified: Secondary | ICD-10-CM | POA: Insufficient documentation

## 2019-09-05 DIAGNOSIS — E559 Vitamin D deficiency, unspecified: Secondary | ICD-10-CM

## 2019-09-05 NOTE — Progress Notes (Signed)
Telehealth office visit note for Amber Henson, D.O- at [facility] at [facility]   I connected with current patient today and verified that I am speaking with the correct person   . Location of the patient: Home . Location of the provider: Office - This visit type was conducted due to national recommendations for restrictions regarding the COVID-19 Pandemic (e.g. social distancing) in an effort to limit this patient's exposure and mitigate transmission in our community.    - No physical exam could be performed with this format, beyond that communicated to [place] by the patient/ family members as noted.   - Additionally my office staff/ schedulers were to discuss with the patient that there may be a monetary charge related to this service, depending on their medical insurance.  My understanding is that patient understood and consented to proceed.     _________________________________________________________________________________   History of Present Illness:  I, Toni Amend, am serving as Education administrator for [employer].  She is feeling good today.  - Lifestyle Has been good, working from home.  Notes "it's not stressful."  She goes into the office once every two weeks, checks the mail, and does what she needs to do there, and comes back.  Says "it's got its pros and cons."  She likes people, so she misses that aspect of the office setting, and "don't know when we'll be going back."  - Vitamin D Deficiency She continues Vitamin D supplementation as prescribed.  - Weight Loss Goals She is currently on a diet/eating plan through [facility].  Her weight is down to 184 lbs today from 191 in January.  Notes with this plan, she is incorporating protein drinks, vegetables, and avoiding sugar.  She lost 7 lbs the first week in January because she cut sugar out that week.  She does this diet virtually, with monitoring by her coach every Monday.   States "it's all about your metabolism and your inner health, and getting all that toxic stuff out."  Patient was told by her coach that in 6 months to a year, she will be able to stop taking her blood pressure medicine.  On her plan, she is supposed to drink 64 ounces of water per day, and notes "I try."  Says this is not counting the protein drinks, which come in flavors like apple, grape, etc.  She places these in 4-6 ounces of water and drinks them several times per day.  HPI:  Hypertension:  -  Her blood pressure at home has been running: 112/82 on average.  - Patient reports good compliance with medication and/or lifestyle modification  - Her denies acute concerns or problems related to treatment plan  - She denies new onset of: chest pain, exercise intolerance, shortness of breath, dizziness, visual changes, headache, lower extremity swelling or claudication.   Last 3 blood pressure readings in our office are as follows: BP Readings from Last 3 Encounters:  09/05/19 112/82  04/10/19 130/90  03/09/19 (!) 140/98   Filed Weights   09/05/19 0922  Weight: 184 lb 12.8 oz (83.8 kg)    No flowsheet data found.  Depression screen [facility] 2/9 09/05/2019 03/07/2019 02/14/2018 06/09/2017  Decreased Interest 0 - 0 0  Down, Depressed, Hopeless 0 - 0 0  PHQ - 2 Score 0 - 0 0  Altered sleeping 0 0 0 0  Tired, decreased energy 0 1 1 1   Change in appetite 0 1 0  1  Feeling bad or failure about yourself  0 0 0 0  Trouble concentrating 0 0 0 0  Moving slowly or fidgety/restless 0 0 0 0  Suicidal thoughts 0 0 0 0  PHQ-9 Score 0 - 1 2  Difficult doing work/chores - Not difficult at all Not difficult at all Not difficult at all      Impression and Recommendations:     1. Elevated blood pressure reading   2. Vitamin D deficiency   3. Obesity, unspecified classification, unspecified obesity type, unspecified whether serious comorbidity present      Elevated BP Reading - BP stable at this  time. - Continue management as established.  See med list.  - Ongoing ambulatory BP monitoring encouraged.  - Will continue to monitor.   Obesity - Weight Loss Goals - Discussed patient's current weight loss plan.  - Continue weight loss with plan as established.  - Encouraged patient to engage in prudent lifestyle habits.  - Will continue to monitor.   Vitamin D Deficiency - Stable at this time. - Continue supplementation as established.  See med list. - Will continue to monitor.   Recommendations - Return as scheduled for CPE and full fasting blood work in six months.   - As part of my medical decision making, I reviewed the following data within the [place] History obtained from pt /family, CMA notes reviewed and incorporated if applicable, Labs reviewed, Radiograph/ tests reviewed if applicable and OV notes from prior OV's with me, as well as other specialists she/he has seen since seeing me last, were all reviewed and used in my medical decision making process today.    - Additionally, when appropriate, discussion had with patient regarding our treatment plan, and their biases/concerns about that plan were used in my medical decision making today.    - The patient agreed with the plan and demonstrated an understanding of the instructions.   No barriers to understanding were identified.     - The patient was advised to call back or seek an in-person evaluation if the symptoms worsen or if the condition fails to improve as anticipated.   Return for f/up 6 months for yearly physical and full fasting blood work same day.    Time spent on visit including pre-visit chart review and post-visit care was 8 minutes.   Note:  This note was prepared with assistance of Dragon voice recognition software. Occasional wrong-word or sound-a-like substitutions may have occurred due to the inherent limitations of voice recognition software.  The [place] was signed into law in 2016 which includes the topic of electronic health records.  This provides immediate access to information in MyChart.  This includes consultation notes, operative notes, office notes, lab results and pathology reports.  If you have any questions about what you read please let us know at your next visit or call us at the office.  We are right here with you.  This document serves as a record of services personally performed by Amber Dance, DO. It was created on her behalf by Toni Amend, a trained medical scribe. The creation of this record is based on the scribe's personal observations and the provider's statements to them.    The above documentation from Toni Amend, medical scribe, has been reviewed by Marjory Sneddon, D.O.    __________________________________________________________________________________     Patient Care Team    Relationship Specialty Notifications Start End  Amber Dance, DO PCP -  General Family Medicine  [id number]   [place], Ginette Otto Referring Physician Physician Assistant  10/30/14    Comment: PA  under  Gynecologist :  Glean Salen, M.D.-   [facility] in [place], Forest Gleason, MD  General Surgery  10/30/14   Druscilla Brownie, MD Consulting Physician Dermatology  06/09/17      -Vitals obtained; medications/ allergies reconciled;  personal medical, social, Sx etc.histories were updated by CMA, reviewed by me and are reflected in chart   Patient Active Problem List   Diagnosis Date Noted  . Vitamin D deficiency 03/28/2016  . Hypertriglyceridemia 03/28/2016  . Hypertension 01/10/2016  . Overweight (BMI 25.0-29.9) 01/05/2016  . Obesity 09/05/2019  . Elevated blood pressure reading 09/05/2019  . Uterine prolapse 03/09/2019  . Sprain of ankle 05/25/2018  . Strain of knee 05/25/2018  . Osteoarthritis of knee 04/12/2018  . Essential hypertension 06/09/2017  . Pelvic floor instability 06/09/2017   . Conjunctivitis of left eye 11/01/2016  . Mass of right breast on mammogram 01/10/2016  . Diffuse cystic mastopathy of both breasts (R>L) 01/10/2016  . Family history of diabetes mellitus-father 01/10/2016  . Family history of ovarian cancer- sister age 7 01/10/2016  . Family history of essential hypertension- in 60s and early 50s 01/10/2016  . Family history of early CAD 01/05/2016  . History of shingles 01/05/2016  . h/o Hemorrhoid- sx repair 6/16 11/01/2014     Current Meds  Medication Sig  . ERRIN 0.35 MG tablet TAKE 1 TABLET BY MOUTH  DAILY  . losartan-hydrochlorothiazide (HYZAAR) 50-12.5 MG tablet Take 1 tablet by mouth daily.  . meloxicam (MOBIC) 15 MG tablet meloxicam 15 mg tablet  TK 1 T PO QD  . Multiple Vitamin (MULTIVITAMIN) capsule Take 1 capsule by mouth daily.  . Omega-3 Fatty Acids (FISH OIL PO) Take 1 capsule by mouth daily.  Marland Kitchen OVER THE COUNTER MEDICATION Bone nutrient     Allergies:  No Known Allergies   ROS:  See above HPI for pertinent positives and negatives   Objective:   Blood pressure 112/82, pulse 73, height 5\' 5"  (1.651 m), weight 184 lb 12.8 oz (83.8 kg), last menstrual period 07/29/2019.  (if some vitals are omitted, this means that patient was UNABLE to obtain them even though they were asked to get them prior to OV today.  They were asked to call us at their earliest convenience with these once obtained. ) General: A & O * 3; sounds in no acute distress; in usual state of health.  Skin: Pt confirms warm and dry extremities and pink fingertips HEENT: Pt confirms lips non-cyanotic Chest: Patient confirms normal chest excursion and movement Respiratory: speaking in full sentences, no conversational dyspnea; patient confirms no use of accessory muscles Psych: insight appears good, mood- appears full

## 2019-09-11 ENCOUNTER — Other Ambulatory Visit: Payer: Self-pay

## 2019-09-11 ENCOUNTER — Other Ambulatory Visit: Payer: Self-pay | Admitting: Obstetrics and Gynecology

## 2019-09-11 ENCOUNTER — Ambulatory Visit
Admission: RE | Admit: 2019-09-11 | Discharge: 2019-09-11 | Disposition: A | Discharge status: Home / Self Care | Payer: BC Managed Care – PPO | Source: Ambulatory Visit | Attending: Obstetrics and Gynecology | Admitting: Obstetrics and Gynecology

## 2019-09-11 DIAGNOSIS — N6011 Diffuse cystic mastopathy of right breast: Secondary | ICD-10-CM | POA: Diagnosis not present

## 2019-09-11 DIAGNOSIS — N6310 Unspecified lump in the right breast, unspecified quadrant: Secondary | ICD-10-CM | POA: Diagnosis not present

## 2019-09-11 DIAGNOSIS — R928 Other abnormal and inconclusive findings on diagnostic imaging of breast: Secondary | ICD-10-CM | POA: Diagnosis not present

## 2019-09-25 ENCOUNTER — Ambulatory Visit: Payer: BC Managed Care – PPO | Attending: Internal Medicine

## 2019-09-25 DIAGNOSIS — Z23 Encounter for immunization: Secondary | ICD-10-CM

## 2019-09-25 NOTE — Progress Notes (Signed)
   Covid-19 Vaccination Clinic  Name:  JUNKO LONGAKER    MRN: [id number] DOB: Jul 11, 1960  09/25/2019  Ms. Pilger was observed post Covid-19 immunization for 15 minutes without incident. She was provided with Vaccine Information Sheet and instruction to access the V-Safe system.   Ms. Hrncir was instructed to call 911 with any severe reactions post vaccine: Marland Kitchen Difficulty breathing  . Swelling of face and throat  . A fast heartbeat  . A bad rash all over body  . Dizziness and weakness   Immunizations Administered    Name Date Dose VIS Date Route   Pfizer COVID-19 Vaccine 09/25/2019  9:44 AM 0.3 mL 07/18/2018 Intramuscular   Manufacturer: [place]   Lot: [id number]   [place]: [id number]

## 2019-10-25 DIAGNOSIS — H938X1 Other specified disorders of right ear: Secondary | ICD-10-CM | POA: Diagnosis not present

## 2019-10-25 DIAGNOSIS — H6123 Impacted cerumen, bilateral: Secondary | ICD-10-CM | POA: Diagnosis not present

## 2020-01-03 ENCOUNTER — Other Ambulatory Visit: Payer: Self-pay | Admitting: Obstetrics and Gynecology

## 2020-01-03 DIAGNOSIS — I10 Essential (primary) hypertension: Secondary | ICD-10-CM

## 2020-01-28 NOTE — Progress Notes (Signed)
Chief Complaint  Patient presents with  . Gynecologic Exam  . Injections    flu shot  . [employer] Employee     HPI:      [name] is a 59 y.o. [id number] who LMP was Patient's last menstrual period was 09/01/2019 (approximate)., presents today for her annual examination.  Her menses are absent since 4/21 when she stopped POPs. Was having monthly menses on them. No BTB/dysmen. No vasomotor sx.   Sex activity: sexually active. Contraception: none. Has some vag dryness and uses OTC lubricant with success. Has some discomfort due to uterine prolapse.  Did pelvic PT ~2018 with some improvement per pt report, but insurance not covering it again. Pt has occas urge incont. Discussed pessary vs TVH with Dr. Kenton Kingfisher 10/20, and would like to proceed with [facility]. Has lost wt and feels it pulling more inside.   Last Pap: 12/19/17 Results were: no abnormalities /neg HPV DNA.  Hx of STDs: none  Last mammogram: 09/11/19 RT breast and 03/22/19 bilat;  Results were: normal/repeat in 6 months. Is getting Q6 mo imaging for cat 3 for benign RT breast mass- There is a FH of breast cancer in her pat cousin. There is a FH of ovarian cancer in her sister and colon cancer in her brother. 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 The patient does do self-breast exams.  Colonoscopy: colonoscopy 2016 without abnormalities with Dr. Bary Castilla. She is due for repeat after 5 yrs due to [place] of polyps/FH colon cancer in her brother.  Tobacco use: The patient denies current or previous tobacco use. Alcohol use: none Drug use: none Exercise: not active due to prolapse  She does get adequate calcium and Vitamin D in her diet.  We are managing her HTN. Her BP readings have been about 100/70 at home. Rarely has dizziness. Is on losartan/HCTZ 50-12.5 mg. Has lost 20# since last yr and wonders if needs same med dose. No side effects with losartan. ACEI cough resolved. Normal BMP 9/20 at work.    Does labs with PCP 9/19  and at work.   Past Medical History:  Diagnosis Date  . Eczema   . Family history of ovarian cancer 10/18/2014   MyRisk neg; sister  . 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 Hemorrhoids 2016   REM  by DR. BYRNETT c colonoscopy  . History of mammogram 10/18/14; 11/05/15   scattered fibroglandular densities, saw surgeon; incomplete-add views - stable;  . History of Papanicolaou smear of cervix 10/18/2014   -/-  . Hypertension   . Psoriasis   . Shingles 09/2015    Past Surgical History:  Procedure Laterality Date  . BREAST BIOPSY Left 10/2014   negative core  . COLONOSCOPY N/A 11/20/2014   Procedure: COLONOSCOPY;  Surgeon: Robert Bellow, MD;  Location: [facility] ENDOSCOPY;  Service: Endoscopy;  Laterality: N/A;  . HEMORRHOID SURGERY N/A 11/20/2014   Procedure: HEMORRHOIDECTOMY;  Surgeon: Robert Bellow, MD;  Location: [facility] ORS;  Service: General;  Laterality: N/A;    Family History  Problem Relation Age of Onset  . Hypertension Mother 78  . Colonic polyp Mother   . Diabetes Father 28       TYPE 2  . Hypertension Father 4  . Heart attack Father   . Cancer Father 44       SKIN CA IN SITU  . Cancer Sister 57       ovarian,  CERVICAL  . Heart attack Brother   . Hypertension Brother   . Colon cancer Brother 87  . Breast cancer Neg Hx     Social History   Socioeconomic History  . Marital status: Widowed    Spouse name: Not on file  . Number of children: 2  . Years of education: 63  . Highest education level: Not on file  Occupational History  . Occupation: LEGAL SECRETARY  Tobacco Use  . Smoking status: Never Smoker  . Smokeless tobacco: Never Used  Vaping Use  . Vaping Use: Never used  Substance and Sexual Activity  . Alcohol use: No  . Drug use: No  . Sexual activity: Yes    Birth control/protection: None  Other Topics Concern  . Not on file  Social History Narrative  . Not on file   Social Determinants of Health    Financial Resource Strain:   . Difficulty of Paying Living Expenses: Not on file  Food Insecurity:   . Worried About Charity fundraiser in the Last Year: Not on file  . Ran Out of Food in the Last Year: Not on file  Transportation Needs:   . Lack of Transportation (Medical): Not on file  . Lack of Transportation (Non-Medical): Not on file  Physical Activity:   . Days of Exercise per Week: Not on file  . Minutes of Exercise per Session: Not on file  Stress:   . Feeling of Stress : Not on file  Social Connections:   . Frequency of Communication with Friends and Family: Not on file  . Frequency of Social Gatherings with Friends and Family: Not on file  . Attends Religious Services: Not on file  . Active Member of Clubs or Organizations: Not on file  . Attends Archivist Meetings: Not on file  . Marital Status: Not on file  Intimate Partner Violence:   . Fear of Current or Ex-Partner: Not on file  . Emotionally Abused: Not on file  . Physically Abused: Not on file  . Sexually Abused: Not on file    Current Outpatient Medications on File Prior to Visit  Medication Sig Dispense Refill  . losartan-hydrochlorothiazide (HYZAAR) 50-12.5 MG tablet TAKE 1 TABLET BY MOUTH  DAILY 90 tablet 0  . meloxicam (MOBIC) 15 MG tablet meloxicam 15 mg tablet  TK 1 T PO QD    . Multiple Vitamin (MULTIVITAMIN) capsule Take 1 capsule by mouth daily.    . Omega-3 Fatty Acids (FISH OIL PO) Take 1 capsule by mouth daily.    Marland Kitchen OVER THE COUNTER MEDICATION Bone nutrient    . Vitamin D, Ergocalciferol, (DRISDOL) 50000 units CAPS capsule TAKE 1 CAPSULE BY MOUTH  EVERY 7 DAYS 12 capsule 3  . ERRIN 0.35 MG tablet TAKE 1 TABLET BY MOUTH  DAILY (Patient not taking: Reported on 01/29/2020) 84 tablet 3   No current facility-administered medications on file prior to visit.    ROS:  Review of Systems  Constitutional: Negative for fatigue, fever and unexpected weight change.  Respiratory: Negative for  cough, shortness of breath and wheezing.   Cardiovascular: Negative for chest pain, palpitations and leg swelling.  Gastrointestinal: Negative for blood in stool, constipation, diarrhea, nausea and vomiting.  Endocrine: Negative for cold intolerance, heat intolerance and polyuria.  Genitourinary: Positive for dyspareunia. Negative for dysuria, flank pain, frequency, genital sores, hematuria, menstrual problem, pelvic pain, urgency, vaginal bleeding, vaginal discharge and vaginal pain.  Musculoskeletal: Negative for back pain, joint swelling  and myalgias.  Skin: Negative for rash.  Neurological: Negative for dizziness, syncope, light-headedness, numbness and headaches.  Hematological: Negative for adenopathy.  Psychiatric/Behavioral: Negative for agitation, confusion, sleep disturbance and suicidal ideas. The patient is not nervous/anxious.      Objective: BP 110/80   Ht $R'5\' 5"'PT$  (1.651 m)   Wt 164 lb (74.4 kg)   LMP 09/01/2019 (Approximate)   BMI 27.29 kg/m    Physical Exam Constitutional:      Appearance: She is well-developed.  Genitourinary:     Vulva, vagina, uterus, right adnexa and left adnexa normal.     No vulval lesion or tenderness noted.     No vaginal discharge, erythema or tenderness.     No cervical motion tenderness or polyp.     Uterus is not enlarged or tender.     No right or left adnexal mass present.     Right adnexa not tender.     Left adnexa not tender.     Genitourinary Comments: GRADE 1-2 UTERINE PROLAPSE WITH VALSALVA (WORSE SINCE LAST YEAR'S EXAM)  Neck:     Thyroid: No thyromegaly.  Cardiovascular:     Rate and Rhythm: Normal rate and regular rhythm.     Heart sounds: Normal heart sounds. No murmur heard.   Pulmonary:     Effort: Pulmonary effort is normal.     Breath sounds: Normal breath sounds.  Chest:     Breasts:        Right: No mass, nipple discharge, skin change or tenderness.        Left: No mass, nipple discharge, skin change or  tenderness.  Abdominal:     Palpations: Abdomen is soft.     Tenderness: There is no abdominal tenderness. There is no guarding.  Musculoskeletal:        General: Normal range of motion.     Cervical back: Normal range of motion.  Neurological:     General: No focal deficit present.     Mental Status: She is alert and oriented to person, place, and time.     Cranial Nerves: No cranial nerve deficit.  Skin:    General: Skin is warm and dry.  Psychiatric:        Mood and Affect: Mood normal.        Behavior: Behavior normal.        Thought Content: Thought content normal.        Judgment: Judgment normal.  Vitals reviewed.     Assessment/Plan: Encounter for annual routine gynecological examination  Encounter for screening mammogram for malignant neoplasm of breast - Plan: MM DIAG BREAST TOMO BILATERAL, US BREAST LTD UNI RIGHT INC AXILLA; will sched mammo for pt and f/u with results.   Abnormal mammogram of right breast - Plan: MM DIAG BREAST TOMO BILATERAL, US BREAST LTD UNI RIGHT INC AXILLA  Screening for colon cancer--pt to f/u with Dr. Bary Castilla for colonoscopy  Essential hypertension--Bps lower since wt loss. Try 1/2 tab losartan/HCTZ 50-12.5 dose and keep BP check at home. Pt doesn't need RF yet. Will call for RF and BP f/u.   Uterine prolapse--refer back to Dr. Kenton Kingfisher for [facility] consult. Pt ready to proceed with surg  Needs flu shot - Plan: Flu Vaccine QUAD 36+ mos IM (Fluarix, Quad PF)    GYN counsel breast self exam, mammography screening, menopause, adequate intake of calcium and vitamin D, diet and exercise     F/U  Return in about 2 weeks (around 02/12/2020) for  [facility] consult with DR. Harris.  Chenee Munns B. Yeudiel Mateo, PA-C 01/29/2020 10:41 AM

## 2020-01-29 ENCOUNTER — Encounter: Payer: Self-pay | Admitting: Obstetrics and Gynecology

## 2020-01-29 ENCOUNTER — Other Ambulatory Visit: Payer: Self-pay

## 2020-01-29 ENCOUNTER — Ambulatory Visit (INDEPENDENT_AMBULATORY_CARE_PROVIDER_SITE_OTHER): Payer: BC Managed Care – PPO | Admitting: Obstetrics and Gynecology

## 2020-01-29 VITALS — BP 110/80 | Ht 65.0 in | Wt 164.0 lb

## 2020-01-29 DIAGNOSIS — R928 Other abnormal and inconclusive findings on diagnostic imaging of breast: Secondary | ICD-10-CM | POA: Diagnosis not present

## 2020-01-29 DIAGNOSIS — Z01419 Encounter for gynecological examination (general) (routine) without abnormal findings: Secondary | ICD-10-CM | POA: Diagnosis not present

## 2020-01-29 DIAGNOSIS — I10 Essential (primary) hypertension: Secondary | ICD-10-CM

## 2020-01-29 DIAGNOSIS — Z23 Encounter for immunization: Secondary | ICD-10-CM | POA: Diagnosis not present

## 2020-01-29 DIAGNOSIS — Z1231 Encounter for screening mammogram for malignant neoplasm of breast: Secondary | ICD-10-CM | POA: Diagnosis not present

## 2020-01-29 DIAGNOSIS — N814 Uterovaginal prolapse, unspecified: Secondary | ICD-10-CM

## 2020-01-29 DIAGNOSIS — Z1211 Encounter for screening for malignant neoplasm of colon: Secondary | ICD-10-CM | POA: Diagnosis not present

## 2020-01-29 NOTE — Patient Instructions (Signed)
I value your feedback and entrusting us with your care. If you get a [facility] patient survey, I would appreciate you taking the time to let us know about your experience today. Thank you!  As of May 03, 2019, your lab results will be released to your MyChart immediately, before I even have a chance to see them. Please give me time to review them and contact you if there are any abnormalities. Thank you for your patience.  

## 2020-02-14 ENCOUNTER — Encounter: Payer: Self-pay | Admitting: Obstetrics & Gynecology

## 2020-02-14 ENCOUNTER — Ambulatory Visit (INDEPENDENT_AMBULATORY_CARE_PROVIDER_SITE_OTHER): Payer: BC Managed Care – PPO | Admitting: Obstetrics & Gynecology

## 2020-02-14 ENCOUNTER — Other Ambulatory Visit: Payer: Self-pay

## 2020-02-14 VITALS — BP 120/80 | Ht 65.0 in | Wt 166.0 lb

## 2020-02-14 DIAGNOSIS — N814 Uterovaginal prolapse, unspecified: Secondary | ICD-10-CM | POA: Diagnosis not present

## 2020-02-14 DIAGNOSIS — N8111 Cystocele, midline: Secondary | ICD-10-CM | POA: Diagnosis not present

## 2020-02-14 NOTE — Progress Notes (Signed)
Patient is a 59 yo G2P@ [employer] who complains of continued and even worsening sx's of uterine prolapse and cystocele. Problem started over a year or so ago. Symptoms include: prolapse of tissue with straining, discomfort: moderate and interferes w sex.  Also, she hasnoted one episode of bleeding due to rubbing w tighter jeans/clothes.  SHe is menopausal w no periods, no longer on any hormone meds.  . Symptoms have gradually worsened.    Husband having prostate surgery in Oct.  PMHx: She  has a past medical history of Eczema, Family history of ovarian cancer (10/18/2014), Genetic testing of female (09/2014), Hemorrhoids (2016), History of mammogram (10/18/14; 11/05/15), History of Papanicolaou smear of cervix (10/18/2014), Hypertension, Psoriasis, and Shingles (09/2015). Also,  has a past surgical history that includes Hemorrhoid surgery (N/A, 11/20/2014); Colonoscopy (N/A, 11/20/2014); and Breast biopsy (Left, 10/2014)., family history includes Cancer (age of onset: 1) in her sister; Cancer (age of onset: 57) in her father; Colon cancer (age of onset: 73) in her brother; Colonic polyp in her mother; Diabetes (age of onset: 22) in her father; Heart attack in her brother and father; Hypertension in her brother; Hypertension (age of onset: 85) in her mother; Hypertension (age of onset: 71) in her father.,  reports that she has never smoked. She has never used smokeless tobacco. She reports that she does not drink alcohol and does not use drugs.  She has a current medication list which includes the following prescription(s): errin, losartan-hydrochlorothiazide, meloxicam, multivitamin, omega-3 fatty acids, OVER THE COUNTER MEDICATION, and vitamin d (ergocalciferol). Also, has No Known Allergies.  ROS  Objective: BP 120/80   Ht 5\' 5"  (1.651 m)   Wt 166 lb (75.3 kg)   BMI 27.62 kg/m  Physical Exam Constitutional:      General: She is not in acute distress.    Appearance: She is well-developed.    Genitourinary:     Pelvic exam was performed with patient supine.     Vagina and uterus normal.     No vaginal erythema or bleeding.     No cervical motion tenderness, discharge, polyp or nabothian cyst.     Uterus is mobile.     Uterus is not enlarged.     No uterine mass detected.    Uterus is midaxial.     No right or left adnexal mass present.     Right adnexa not tender.     Left adnexa not tender.     Genitourinary Comments: Uterus prolapses to beyond introitus on valsalva or cough Urethral mobility, no LOU Gr 1 cystocele No rectocele   HENT:     Head: Normocephalic and atraumatic.     Nose: Nose normal.  Abdominal:     General: There is no distension.     Palpations: Abdomen is soft.     Tenderness: There is no abdominal tenderness.  Musculoskeletal:        General: Normal range of motion.  Neurological:     Mental Status: She is alert and oriented to person, place, and time.     Cranial Nerves: No cranial nerve deficit.  Skin:    General: Skin is warm and dry.  Psychiatric:        Attention and Perception: Attention normal.        Mood and Affect: Mood and affect normal.        Speech: Speech normal.        Behavior: Behavior normal.  Thought Content: Thought content normal.        Judgment: Judgment normal.     ASSESSMENT/PLAN:     ICD-10-CM   1. Uterine prolapse  N81.4   2. Cystocele, midline  N81.11   Options discussed.  Pessary and PT (prior use, min help) and surgery (TVH, prefers preservation of ovaries). Surgery scheduled after husband can recover from prostate surgery in Oct. (plan 04/24/20 or so) Recovery discussed.  A total of 20 minutes were spent face-to-face with the patient as well as preparation, review, communication, and documentation during this encounter.   Barnett Applebaum, MD, [facility], [facility] 02/14/2020  11:12 AM

## 2020-02-14 NOTE — Patient Instructions (Signed)
Vaginal Hysterectomy  A vaginal hysterectomy is a procedure to remove all or part of the uterus through a small incision in the vagina. In this procedure, your health care provider may remove your entire uterus, including the lower end (cervix). You may need a vaginal hysterectomy to treat:  Uterine fibroids.  A condition that causes the lining of the uterus to grow in other areas (endometriosis).  Problems with pelvic support.  Cancer of the cervix, ovaries, uterus, or tissue that lines the uterus (endometrium).  Excessive (dysfunctional) uterine bleeding. When removing your uterus, your health care provider may also remove the organs that produce eggs (ovaries) and the tubes that carry eggs to your uterus (fallopian tubes). After a vaginal hysterectomy, you will no longer be able to have a baby. You will also no longer get your menstrual period. Tell a health care provider about:  Any allergies you have.  All medicines you are taking, including vitamins, herbs, eye drops, creams, and over-the-counter medicines.  Any problems you or family members have had with anesthetic medicines.  Any blood disorders you have.  Any surgeries you have had.  Any medical conditions you have.  Whether you are pregnant or may be pregnant. What are the risks? Generally, this is a safe procedure. However, problems may occur, including:  Bleeding.  Infection.  A blood clot that forms in your leg and travels to your lungs (pulmonary embolism).  Damage to surrounding organs.  Pain during sex. What happens before the procedure?  Ask your health care provider what organs will be removed during surgery.  Ask your health care provider about: ? Changing or stopping your regular medicines. This is especially important if you are taking diabetes medicines or blood thinners. ? Taking medicines such as aspirin and ibuprofen. These medicines can thin your blood. Do not take these medicines before your  procedure if your health care provider instructs you not to.  Follow instructions from your health care provider about eating or drinking restrictions.  Do not use any tobacco products, such as cigarettes, chewing tobacco, and e-cigarettes. If you need help quitting, ask your health care provider.  Plan to have someone take you home after discharge from the hospital. What happens during the procedure?  To reduce your risk of infection: ? Your health care team will wash or sanitize their hands. ? Your skin will be washed with soap.  An IV tube will be inserted into one of your veins.  You may be given antibiotic medicine to help prevent infection.  You will be given one or more of the following: ? A medicine to help you relax (sedative). ? A medicine to numb the area (local anesthetic). ? A medicine to make you fall asleep (general anesthetic). ? A medicine that is injected into an area of your body to numb everything beyond the injection site (regional anesthetic).  Your surgeon will make an incision in your vagina.  Your surgeon will locate and remove all or part of your uterus.  Your ovaries and fallopian tubes may be removed at the same time.  The incision will be closed with stitches (sutures) that dissolve over time. The procedure may vary among health care providers and hospitals. What happens after the procedure?  Your blood pressure, heart rate, breathing rate, and blood oxygen level will be monitored often until the medicines you were given have worn off.  You will be encouraged to get up and walk around after a few hours to help prevent complications.  You may have IV tubes in place for a few days.  You will be given pain medicine as needed.  Do not drive for 24 hours if you were given a sedative. This information is not intended to replace advice given to you by your health care provider. Make sure you discuss any questions you have with your health care  provider. Document Revised: 01/01/2016 Document Reviewed: 05/25/2015 Elsevier Patient Education  2020 Elsevier Inc.  

## 2020-02-29 ENCOUNTER — Telehealth: Payer: Self-pay | Admitting: Obstetrics & Gynecology

## 2020-02-29 NOTE — Telephone Encounter (Signed)
Called pt to schedule TVH/AR with Harris  Pt adv that she is not sure about when she will be able to have surgery done. She adv that she was informed on Monday that her job has been eliminated and that she's unsure of her insurance situation.   She will follow up with me at the end of the month. She plans to secure another position or be added to her husband's policy. I adv that I would leave surgery scheduled since it is 12/2 and we can finalize details once she knows more.

## 2020-03-10 ENCOUNTER — Ambulatory Visit
Admission: RE | Admit: 2020-03-10 | Discharge: 2020-03-10 | Disposition: A | Discharge status: Home / Self Care | Payer: BC Managed Care – PPO | Source: Ambulatory Visit | Attending: Obstetrics and Gynecology | Admitting: Obstetrics and Gynecology

## 2020-03-10 ENCOUNTER — Other Ambulatory Visit: Payer: Self-pay | Admitting: Obstetrics and Gynecology

## 2020-03-10 ENCOUNTER — Other Ambulatory Visit: Payer: Self-pay

## 2020-03-10 ENCOUNTER — Encounter: Payer: Self-pay | Admitting: Obstetrics and Gynecology

## 2020-03-10 DIAGNOSIS — Z308 Encounter for other contraceptive management: Secondary | ICD-10-CM

## 2020-03-10 DIAGNOSIS — N6313 Unspecified lump in the right breast, lower outer quadrant: Secondary | ICD-10-CM | POA: Diagnosis not present

## 2020-03-10 DIAGNOSIS — R928 Other abnormal and inconclusive findings on diagnostic imaging of breast: Secondary | ICD-10-CM | POA: Diagnosis not present

## 2020-03-10 DIAGNOSIS — Z1231 Encounter for screening mammogram for malignant neoplasm of breast: Secondary | ICD-10-CM

## 2020-03-18 ENCOUNTER — Telehealth: Payer: Self-pay | Admitting: Obstetrics & Gynecology

## 2020-03-18 NOTE — Telephone Encounter (Signed)
I returned pt call so that we could schedule surgery. When I spoke with her last she was unsure of things as she was being laid off within the next week. She has now adv that her husband will be having surgery and not be back at work until 12/4 and wanted to sch for after that.  Total Vaginal Hysterectomy w Anterior Colporrhaphy  DOS 12/28  H&P 12/21 @ 10:20   Covid testing 12/27 @ 8-10:30, [address], drive up and wear mask. Advised pt to quarantine until DOS.  Pre-admit phone call appointment to be requested - date and time will be included on H&P paper work. Also all appointments will be updated on pt MyChart. Explained that this appointment has a call window. Based on the time scheduled will indicate if the call will be received within a 4 hour window before 1:00 or after.  Advised that pt may also receive calls from the hospital pharmacy and pre-service center.  Confirmed pt has [employer] as Chartered certified accountant. This her husbands plan and she provided me with the new id/group #  [id number] Group: [id number] Amber Henson - [birth year] Employer: [employer] No secondary insurance.

## 2020-03-18 NOTE — Telephone Encounter (Signed)
-----   Message from Gae Dry, MD sent at 02/14/2020 11:11 AM EDT ----- Regarding: SURGERY Surgery Booking Request Patient Full Name:  Amber Henson  MRN: [id number]  DOB: 23-Sep-1960  Surgeon: Hoyt Koch, MD  Requested Surgery Date and Time: 04/24/2020 Primary Diagnosis AND Code:  1. Uterine prolapse  N81.4  2. Cystocele, midline  N81.11  Secondary Diagnosis and Code:  Surgical Procedure: Total Vaginal Hysterectomy with Anterior Colporrhaphy RNFA Requested?: No L&D Notification: No Admission Status: same day surgery Length of Surgery: 50 min Special Case Needs: No H&P: Yes Phone Interview???:  Yes Interpreter: No Medical Clearance:  No Special Scheduling Instructions: No Any known health/anesthesia issues, diabetes, sleep apnea, latex allergy, defibrillator/pacemaker?: No Acuity: P3   (P1 highest, P2 delay may cause harm, P3 low, elective gyn, P4 lowest)

## 2020-03-25 ENCOUNTER — Other Ambulatory Visit: Payer: Self-pay | Admitting: Obstetrics and Gynecology

## 2020-03-25 DIAGNOSIS — I10 Essential (primary) hypertension: Secondary | ICD-10-CM

## 2020-03-26 ENCOUNTER — Other Ambulatory Visit: Payer: Self-pay | Admitting: Obstetrics and Gynecology

## 2020-03-26 DIAGNOSIS — Z308 Encounter for other contraceptive management: Secondary | ICD-10-CM

## 2020-03-26 NOTE — Telephone Encounter (Signed)
Please advise 

## 2020-03-27 ENCOUNTER — Telehealth: Payer: Self-pay | Admitting: Obstetrics and Gynecology

## 2020-03-27 NOTE — Telephone Encounter (Signed)
Got Rx RF request from [facility] re: losartan/HCTZ. Pt taking 1/2 tab now due to wt loss and BP is stable. Pt is no longer with [facility] so no longer using [facility]. Also, doesn't need RF currently since cutting pill in half. Getting new insurance, will f/u for RF when needed.  Has TVH for uterine prolapse with Dr. Kenton Kingfisher 12/21.

## 2020-04-10 ENCOUNTER — Other Ambulatory Visit: Payer: Self-pay | Admitting: Obstetrics and Gynecology

## 2020-04-10 DIAGNOSIS — I10 Essential (primary) hypertension: Secondary | ICD-10-CM

## 2020-05-03 ENCOUNTER — Other Ambulatory Visit: Payer: BC Managed Care – PPO

## 2020-05-03 DIAGNOSIS — Z20822 Contact with and (suspected) exposure to covid-19: Secondary | ICD-10-CM | POA: Diagnosis not present

## 2020-05-13 ENCOUNTER — Encounter: Payer: Self-pay | Admitting: Obstetrics & Gynecology

## 2020-05-13 ENCOUNTER — Ambulatory Visit (INDEPENDENT_AMBULATORY_CARE_PROVIDER_SITE_OTHER): Payer: BC Managed Care – PPO | Admitting: Obstetrics & Gynecology

## 2020-05-13 ENCOUNTER — Other Ambulatory Visit: Payer: Self-pay

## 2020-05-13 VITALS — BP 122/80 | Ht 65.0 in | Wt 165.0 lb

## 2020-05-13 DIAGNOSIS — N814 Uterovaginal prolapse, unspecified: Secondary | ICD-10-CM

## 2020-05-13 DIAGNOSIS — N8111 Cystocele, midline: Secondary | ICD-10-CM

## 2020-05-13 NOTE — H&P (View-Only) (Signed)
PRE-OPERATIVE HISTORY AND PHYSICAL EXAM  HPI:  Amber Henson is a 59 y.o. [id number] No LMP recorded. (Menstrual status: Irregular Periods).; she is being admitted for surgery related to pelvic relaxation.  She continues with worsening sx's of uterine prolapse and cystocele. Problem started over a year ago. Symptoms include: prolapse of tissue with straining, discomfort: moderate and interferes w sex.  Also, she has noted one episode of bleeding due to rubbing w tighter jeans/clothes.  She is menopausal w no periods, no longer on any hormone meds.  . Symptoms have gradually worsened.  PMHx: Past Medical History:  Diagnosis Date  . Eczema   . Family history of ovarian cancer 10/18/2014   MyRisk neg; sister  . 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 Hemorrhoids 2016   REM  by DR. BYRNETT c colonoscopy  . History of mammogram 10/18/14; 11/05/15   scattered fibroglandular densities, saw surgeon; incomplete-add views - stable;  . History of Papanicolaou smear of cervix 10/18/2014   -/-  . Hypertension   . Psoriasis   . Shingles 09/2015   Past Surgical History:  Procedure Laterality Date  . BREAST BIOPSY Left 10/2014   negative core  . COLONOSCOPY N/A 11/20/2014   Procedure: COLONOSCOPY;  Surgeon: Isaly Fasching Bellow, MD;  Location: [facility] ENDOSCOPY;  Service: Endoscopy;  Laterality: N/A;  . HEMORRHOID SURGERY N/A 11/20/2014   Procedure: HEMORRHOIDECTOMY;  Surgeon: Jarion Hawthorne Bellow, MD;  Location: [facility] ORS;  Service: General;  Laterality: N/A;   Family History  Problem Relation Age of Onset  . Hypertension Mother 2  . Colonic polyp Mother   . Diabetes Father 38       TYPE 2  . Hypertension Father 23  . Heart attack Father   . Cancer Father 53       SKIN CA IN SITU  . Cancer Sister 18       ovarian, CERVICAL  . Heart attack Brother   . Hypertension Brother   . Colon cancer Brother 81  . Breast cancer Neg Hx    Social History   Tobacco Use  .  Smoking status: Never Smoker  . Smokeless tobacco: Never Used  Vaping Use  . Vaping Use: Never used  Substance Use Topics  . Alcohol use: No  . Drug use: No    Current Outpatient Medications:  .  losartan-hydrochlorothiazide (HYZAAR) 50-12.5 MG tablet, TAKE 1 TABLET BY MOUTH  DAILY (Patient taking differently: Take 0.5 tablets by mouth daily.), Disp: 90 tablet, Rfl: 0 .  meloxicam (MOBIC) 15 MG tablet, Take 15 mg by mouth daily as needed for pain., Disp: , Rfl:  .  Multiple Minerals-Vitamins (NUTRA-SUPPORT BONE PO), Take 1 tablet by mouth daily., Disp: , Rfl:  .  Multiple Vitamin (MULTIVITAMIN) capsule, Take 1 capsule by mouth daily., Disp: , Rfl:  .  Omega-3 Fatty Acids (FISH OIL PO), Take 1 capsule by mouth daily., Disp: , Rfl:  Allergies: Patient has no known allergies.  Review of Systems  Constitutional: Negative for chills, fever and malaise/fatigue.  HENT: Negative for congestion, sinus pain and sore throat.   Eyes: Negative for blurred vision and pain.  Respiratory: Negative for cough and wheezing.   Cardiovascular: Negative for chest pain and leg swelling.  Gastrointestinal: Negative for abdominal pain, constipation, diarrhea, heartburn, nausea and vomiting.  Genitourinary: Negative for dysuria, frequency, hematuria and urgency.  Musculoskeletal: Negative for back pain, joint pain, myalgias and neck pain.  Skin: Negative for itching and rash.  Neurological: Negative for dizziness, tremors and weakness.  Endo/Heme/Allergies: Does not bruise/bleed easily.  Psychiatric/Behavioral: Negative for depression. The patient is not nervous/anxious and does not have insomnia.     Objective: BP 122/80   Ht 5\' 5"  (1.651 m)   Wt 165 lb (74.8 kg)   BMI 27.46 kg/m   Filed Weights   05/13/20 1006  Weight: 165 lb (74.8 kg)   Physical Exam Constitutional:      General: She is not in acute distress.    Appearance: She is well-developed and well-nourished.  Genitourinary:  Breasts:      Right: No mass, skin change or tenderness.     Left: No mass, skin change or tenderness.    HENT:     Head: Normocephalic and atraumatic. No laceration.     Right Ear: Hearing normal.     Left Ear: Hearing normal.     Nose: No epistaxis or foreign body.     Mouth/Throat:     Mouth: Oropharynx is clear and moist and mucous membranes are normal.     Pharynx: Uvula midline.  Eyes:     Pupils: Pupils are equal, round, and reactive to light.  Neck:     Thyroid: No thyromegaly.  Cardiovascular:     Rate and Rhythm: Normal rate and regular rhythm.     Heart sounds: No murmur heard. No friction rub. No gallop.   Pulmonary:     Effort: Pulmonary effort is normal. No respiratory distress.     Breath sounds: Normal breath sounds. No wheezing.  Abdominal:     General: Bowel sounds are normal. There is no distension.     Palpations: Abdomen is soft.     Tenderness: There is no abdominal tenderness. There is no rebound.  Musculoskeletal:        General: Normal range of motion.     Cervical back: Normal range of motion and neck supple.  Neurological:     Mental Status: She is alert and oriented to person, place, and time.     Cranial Nerves: No cranial nerve deficit.  Skin:    General: Skin is warm and dry.  Psychiatric:        Mood and Affect: Mood and affect normal.        Judgment: Judgment normal.  Vitals reviewed.     Assessment: 1. Uterine prolapse   2. Cystocele, midline   Plan TVH, AR. Alternatives discussed. Pt desires preservation of ovaries. Pros and cons counseled.  I have had a careful discussion with this patient about all the options available and the risk/benefits of each. I have fully informed this patient that surgery may subject her to a variety of discomforts and risks: She understands that most patients have surgery with little difficulty, but problems can happen ranging from minor to fatal. These include nausea, vomiting, pain, bleeding, infection, poor  healing, hernia, or formation of adhesions. Unexpected reactions may occur from any drug or anesthetic given. Unintended injury may occur to other pelvic or abdominal structures such as Fallopian tubes, ovaries, bladder, ureter (tube from kidney to bladder), or bowel. Nerves going from the pelvis to the legs may be injured. Any such injury may require immediate or later additional surgery to correct the problem. Excessive blood loss requiring transfusion is very unlikely but possible. Dangerous blood clots may form in the legs or lungs. Physical and sexual activity will be restricted in varying degrees for an indeterminate period of  time but most often 2-6 weeks.  Finally, she understands that it is impossible to list every possible undesirable effect and that the condition for which surgery is done is not always cured or significantly improved, and in rare cases may be even worse.Ample time was given to answer all questions.  Barnett Applebaum, MD, [facility], [facility] 05/13/2020  10:11 AM

## 2020-05-13 NOTE — Progress Notes (Signed)
PRE-OPERATIVE HISTORY AND PHYSICAL EXAM  HPI:  Amber Henson is a 59 y.o. [id number] No LMP recorded. (Menstrual status: Irregular Periods).; she is being admitted for surgery related to pelvic relaxation.  She continues with worsening sx's of uterine prolapse and cystocele. Problem started over a year ago. Symptoms include: prolapse of tissue with straining, discomfort: moderate and interferes w sex.  Also, she has noted one episode of bleeding due to rubbing w tighter jeans/clothes.  She is menopausal w no periods, no longer on any hormone meds.  . Symptoms have gradually worsened.  PMHx: Past Medical History:  Diagnosis Date  . Eczema   . Family history of ovarian cancer 10/18/2014   MyRisk neg; sister  . 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 Hemorrhoids 2016   REM  by DR. BYRNETT c colonoscopy  . History of mammogram 10/18/14; 11/05/15   scattered fibroglandular densities, saw surgeon; incomplete-add views - stable;  . History of Papanicolaou smear of cervix 10/18/2014   -/-  . Hypertension   . Psoriasis   . Shingles 09/2015   Past Surgical History:  Procedure Laterality Date  . BREAST BIOPSY Left 10/2014   negative core  . COLONOSCOPY N/A 11/20/2014   Procedure: COLONOSCOPY;  Surgeon: Fleda Pagel Bellow, MD;  Location: [facility] ENDOSCOPY;  Service: Endoscopy;  Laterality: N/A;  . HEMORRHOID SURGERY N/A 11/20/2014   Procedure: HEMORRHOIDECTOMY;  Surgeon: Jarrick Fjeld Bellow, MD;  Location: [facility] ORS;  Service: General;  Laterality: N/A;   Family History  Problem Relation Age of Onset  . Hypertension Mother 2  . Colonic polyp Mother   . Diabetes Father 38       TYPE 2  . Hypertension Father 23  . Heart attack Father   . Cancer Father 53       SKIN CA IN SITU  . Cancer Sister 18       ovarian, CERVICAL  . Heart attack Brother   . Hypertension Brother   . Colon cancer Brother 81  . Breast cancer Neg Hx    Social History   Tobacco Use  .  Smoking status: Never Smoker  . Smokeless tobacco: Never Used  Vaping Use  . Vaping Use: Never used  Substance Use Topics  . Alcohol use: No  . Drug use: No    Current Outpatient Medications:  .  losartan-hydrochlorothiazide (HYZAAR) 50-12.5 MG tablet, TAKE 1 TABLET BY MOUTH  DAILY (Patient taking differently: Take 0.5 tablets by mouth daily.), Disp: 90 tablet, Rfl: 0 .  meloxicam (MOBIC) 15 MG tablet, Take 15 mg by mouth daily as needed for pain., Disp: , Rfl:  .  Multiple Minerals-Vitamins (NUTRA-SUPPORT BONE PO), Take 1 tablet by mouth daily., Disp: , Rfl:  .  Multiple Vitamin (MULTIVITAMIN) capsule, Take 1 capsule by mouth daily., Disp: , Rfl:  .  Omega-3 Fatty Acids (FISH OIL PO), Take 1 capsule by mouth daily., Disp: , Rfl:  Allergies: Patient has no known allergies.  Review of Systems  Constitutional: Negative for chills, fever and malaise/fatigue.  HENT: Negative for congestion, sinus pain and sore throat.   Eyes: Negative for blurred vision and pain.  Respiratory: Negative for cough and wheezing.   Cardiovascular: Negative for chest pain and leg swelling.  Gastrointestinal: Negative for abdominal pain, constipation, diarrhea, heartburn, nausea and vomiting.  Genitourinary: Negative for dysuria, frequency, hematuria and urgency.  Musculoskeletal: Negative for back pain, joint pain, myalgias and neck pain.  Skin: Negative for itching and rash.  Neurological: Negative for dizziness, tremors and weakness.  Endo/Heme/Allergies: Does not bruise/bleed easily.  Psychiatric/Behavioral: Negative for depression. The patient is not nervous/anxious and does not have insomnia.     Objective: BP 122/80   Ht $R'5\' 5"'Fm$  (1.651 m)   Wt 165 lb (74.8 kg)   BMI 27.46 kg/m   Filed Weights   05/13/20 1006  Weight: 165 lb (74.8 kg)   Physical Exam Constitutional:      General: She is not in acute distress.    Appearance: She is well-developed and well-nourished.  Genitourinary:  Breasts:      Right: No mass, skin change or tenderness.     Left: No mass, skin change or tenderness.    HENT:     Head: Normocephalic and atraumatic. No laceration.     Right Ear: Hearing normal.     Left Ear: Hearing normal.     Nose: No epistaxis or foreign body.     Mouth/Throat:     Mouth: Oropharynx is clear and moist and mucous membranes are normal.     Pharynx: Uvula midline.  Eyes:     Pupils: Pupils are equal, round, and reactive to light.  Neck:     Thyroid: No thyromegaly.  Cardiovascular:     Rate and Rhythm: Normal rate and regular rhythm.     Heart sounds: No murmur heard. No friction rub. No gallop.   Pulmonary:     Effort: Pulmonary effort is normal. No respiratory distress.     Breath sounds: Normal breath sounds. No wheezing.  Abdominal:     General: Bowel sounds are normal. There is no distension.     Palpations: Abdomen is soft.     Tenderness: There is no abdominal tenderness. There is no rebound.  Musculoskeletal:        General: Normal range of motion.     Cervical back: Normal range of motion and neck supple.  Neurological:     Mental Status: She is alert and oriented to person, place, and time.     Cranial Nerves: No cranial nerve deficit.  Skin:    General: Skin is warm and dry.  Psychiatric:        Mood and Affect: Mood and affect normal.        Judgment: Judgment normal.  Vitals reviewed.     Assessment: 1. Uterine prolapse   2. Cystocele, midline   Plan TVH, AR. Alternatives discussed. Pt desires preservation of ovaries. Pros and cons counseled.  I have had a careful discussion with this patient about all the options available and the risk/benefits of each. I have fully informed this patient that surgery may subject her to a variety of discomforts and risks: She understands that most patients have surgery with little difficulty, but problems can happen ranging from minor to fatal. These include nausea, vomiting, pain, bleeding, infection, poor  healing, hernia, or formation of adhesions. Unexpected reactions may occur from any drug or anesthetic given. Unintended injury may occur to other pelvic or abdominal structures such as Fallopian tubes, ovaries, bladder, ureter (tube from kidney to bladder), or bowel. Nerves going from the pelvis to the legs may be injured. Any such injury may require immediate or later additional surgery to correct the problem. Excessive blood loss requiring transfusion is very unlikely but possible. Dangerous blood clots may form in the legs or lungs. Physical and sexual activity will be restricted in varying degrees for an indeterminate period of  time but most often 2-6 weeks.  Finally, she understands that it is impossible to list every possible undesirable effect and that the condition for which surgery is done is not always cured or significantly improved, and in rare cases may be even worse.Ample time was given to answer all questions.  Barnett Applebaum, MD, [facility], [facility] 05/13/2020  10:11 AM

## 2020-05-13 NOTE — Patient Instructions (Signed)
PRE ADMISSION TESTING For Covid, prior to procedure Monday 9:00-10:00 [facility] entrance (drive up)  Results in 48-72 hours You will not receive notification if test results are negative. If positive for Covid19, your provider will notify you by phone, with additional instructions.   Vaginal Hysterectomy, Care After Refer to this sheet in the next few weeks. These instructions provide you with information about caring for yourself after your procedure. Your health care provider may also give you more specific instructions. Your treatment has been planned according to current medical practices, but problems sometimes occur. Call your health care provider if you have any problems or questions after your procedure. What can I expect after the procedure? After the procedure, it is common to have:  Pain.  Soreness and numbness in your incision areas.  Vaginal bleeding and discharge.  Constipation.  Temporary problems emptying the bladder.  Feelings of sadness or other emotions. Follow these instructions at home: Medicines  Take over-the-counter and prescription medicines only as told by your health care provider.  If you were prescribed an antibiotic medicine, take it as told by your health care provider. Do not stop taking the antibiotic even if you start to feel better.  Do not drive or operate heavy machinery while taking prescription pain medicine. Activity  Return to your normal activities as told by your health care provider. Ask your health care provider what activities are safe for you.  Get regular exercise as told by your health care provider. You may be told to take short walks every day and go farther each time.  Do not lift anything that is heavier than 10 lb (4.5 kg). General instructions   Do not put anything in your vagina for 6 weeks after your surgery or as told by your health care provider. This includes tampons and douches.  Do not have sex  until your health care provider says you can.  Do not take baths, swim, or use a hot tub until your health care provider approves.  Drink enough fluid to keep your urine clear or pale yellow.  Do not drive for 24 hours if you were given a sedative.  Keep all follow-up visits as told by your health care provider. This is important. Contact a health care provider if:  Your pain medicine is not helping.  You have a fever.  You have redness, swelling, or pain at your incision site.  You have blood, pus, or a bad-smelling discharge from your vagina.  You continue to have difficulty urinating. Get help right away if:  You have severe abdominal or back pain.  You have heavy bleeding from your vagina.  You have chest pain or shortness of breath. This information is not intended to replace advice given to you by your health care provider. Make sure you discuss any questions you have with your health care provider. Document Revised: 01/01/2016 Document Reviewed: 05/25/2015 Elsevier Patient Education  2020 [employer].

## 2020-05-15 ENCOUNTER — Other Ambulatory Visit
Admission: RE | Admit: 2020-05-15 | Discharge: 2020-05-15 | Disposition: A | Discharge status: Home / Self Care | Payer: BC Managed Care – PPO | Source: Ambulatory Visit | Attending: Obstetrics & Gynecology | Admitting: Obstetrics & Gynecology

## 2020-05-15 ENCOUNTER — Other Ambulatory Visit: Payer: Self-pay

## 2020-05-15 ENCOUNTER — Encounter
Admission: RE | Admit: 2020-05-15 | Discharge: 2020-05-15 | Disposition: A | Discharge status: Home / Self Care | Payer: BC Managed Care – PPO | Source: Ambulatory Visit | Attending: Obstetrics & Gynecology | Admitting: Obstetrics & Gynecology

## 2020-05-15 ENCOUNTER — Telehealth: Payer: Self-pay | Admitting: Obstetrics & Gynecology

## 2020-05-15 DIAGNOSIS — Z01818 Encounter for other preprocedural examination: Secondary | ICD-10-CM | POA: Diagnosis not present

## 2020-05-15 DIAGNOSIS — I10 Essential (primary) hypertension: Secondary | ICD-10-CM | POA: Insufficient documentation

## 2020-05-15 DIAGNOSIS — Z01810 Encounter for preprocedural cardiovascular examination: Secondary | ICD-10-CM | POA: Diagnosis not present

## 2020-05-15 LAB — BASIC METABOLIC PANEL
Anion gap: 6 (ref 5–15)
BUN: 15 mg/dL (ref 6–20)
CO2: 28 mmol/L (ref 22–32)
Calcium: 9.2 mg/dL (ref 8.9–10.3)
Chloride: 105 mmol/L (ref 98–111)
Creatinine, Ser: 0.74 mg/dL (ref 0.44–1.00)
GFR, Estimated: >60 mL/min (ref >60)
Glucose, Bld: 79 mg/dL (ref 70–99)
Potassium: 3.6 mmol/L (ref 3.5–5.1)
Sodium: 139 mmol/L (ref 135–145)

## 2020-05-15 LAB — TYPE AND SCREEN
ABO/RH(D): A POS
Antibody Screen: NEGATIVE

## 2020-05-15 LAB — CBC
HCT: 42 % (ref 36.0–46.0)
Hemoglobin: 13.7 g/dL (ref 12.0–15.0)
MCH: 30.6 pg (ref 26.0–34.0)
MCHC: 32.6 g/dL (ref 30.0–36.0)
MCV: 93.8 fL (ref 80.0–100.0)
Platelets: 207 10*3/uL (ref 150–400)
RBC: 4.48 MIL/uL (ref 3.87–5.11)
RDW: 13.5 % (ref 11.5–15.5)
WBC: 5.5 10*3/uL (ref 4.0–10.5)
nRBC: 0 % (ref 0.0–0.2)

## 2020-05-15 NOTE — Patient Instructions (Signed)
Your procedure is scheduled on: Tuesday May 20, 2020. Report to Day Surgery inside [facility] 2nd floor (stop by Registration desk first). To find out your arrival time please call [phone] between 1PM - 3PM on Monday May 19, 2020.  Remember: Instructions that are not followed completely may result in serious medical risk,  up to and including death, or upon the discretion of your surgeon and anesthesiologist your  surgery may need to be rescheduled.     _X__ 1. Do not eat food after midnight the night before your procedure.                 No chewing gum or hard candies. You may drink clear liquids up to 2 hours                 before you are scheduled to arrive for your surgery- DO not drink clear                 liquids within 2 hours of the start of your surgery.                 Clear Liquids include:  water, apple juice without pulp, clear Gatorade, G2 or                  Gatorade Zero (avoid Red/Purple/Blue), Black Coffee or Tea (Do not add                 anything to coffee or tea).  __X__2.  On the morning of surgery brush your teeth with toothpaste and water, you                may rinse your mouth with mouthwash if you wish.  Do not swallow any toothpaste of mouthwash.     _X__ 3.  No Alcohol for 24 hours before or after surgery.   _X__ 4.  Do Not Smoke or use e-cigarettes For 24 Hours Prior to Your Surgery.                 Do not use any chewable tobacco products for at least 6 hours prior to                 Surgery.  _X__  5.  Do not use any recreational drugs (marijuana, cocaine, heroin, ecstasy, MDMA or other)                For at least one week prior to your surgery.  Combination of these drugs with anesthesia                May have life threatening results.  ____ 6.  Notify your doctor if there is any change in your medical condition      (cold, fever, infections).     Do not wear jewelry, make-up, hairpins, clips or nail  polish. Do not wear lotions, powders, or perfumes. You may wear deodorant. Do not shave 48 hours prior to surgery. Men may shave face and neck. Do not bring valuables to the hospital.    [facility] is not responsible for any belongings or valuables.  Contacts, dentures or bridgework may not be worn into surgery. Leave your suitcase in the car. After surgery it may be brought to your room. For patients admitted to the hospital, discharge time is determined by your treatment team.   Patients discharged the day of surgery will not be allowed to drive home.   Make  arrangements for someone to be with you for the first 24 hours of your Same Day Discharge.   __X__ Take these medicines the morning of surgery with A SIP OF WATER:    1. None    ____ Fleet Enema (as directed)   ____ Use CHG Soap (or wipes) as directed  ____ Use Benzoyl Peroxide Gel as instructed  ____ Use inhalers on the day of surgery  ____ Stop metformin 2 days prior to surgery    ____ Take 1/2 of usual insulin dose the night before surgery. No insulin the morning          of surgery.   __x__ Stop Anti-inflammatories such asmeloxicam (MOBIC), Ibuprofen, Aleve, Advil, naproxen, aspirin and or BC powders.    __x__ Stop supplements until after surgery.  Omega-3 Fatty Acids (FISH OIL PO)  __x__ Do not start any herbal supplements before your procedure.    If you have any questions regarding your pre-procedure instructions,  Please call Pre-admit Testing at [phone].

## 2020-05-15 NOTE — Telephone Encounter (Signed)
Pt called to adv that she just finished with her preadmit testing appt at [facility]. She made a payment and her receipt reflected that she was having CPT [id number]. Her case was set up using CPT 58260 - which does not have her tubes and ovaries being removed. I will look into this and adv that I will call her on Monday, 05/19/20.

## 2020-05-19 ENCOUNTER — Other Ambulatory Visit
Admission: RE | Admit: 2020-05-19 | Discharge: 2020-05-19 | Disposition: A | Discharge status: Home / Self Care | Payer: BC Managed Care – PPO | Source: Ambulatory Visit | Attending: Obstetrics & Gynecology | Admitting: Obstetrics & Gynecology

## 2020-05-19 ENCOUNTER — Other Ambulatory Visit: Payer: Self-pay

## 2020-05-19 DIAGNOSIS — Z01812 Encounter for preprocedural laboratory examination: Secondary | ICD-10-CM | POA: Insufficient documentation

## 2020-05-19 DIAGNOSIS — Z20822 Contact with and (suspected) exposure to covid-19: Secondary | ICD-10-CM | POA: Insufficient documentation

## 2020-05-19 DIAGNOSIS — D259 Leiomyoma of uterus, unspecified: Secondary | ICD-10-CM | POA: Diagnosis not present

## 2020-05-19 DIAGNOSIS — N840 Polyp of corpus uteri: Secondary | ICD-10-CM | POA: Diagnosis not present

## 2020-05-19 DIAGNOSIS — Z79899 Other long term (current) drug therapy: Secondary | ICD-10-CM | POA: Diagnosis not present

## 2020-05-19 DIAGNOSIS — N814 Uterovaginal prolapse, unspecified: Secondary | ICD-10-CM | POA: Diagnosis not present

## 2020-05-19 LAB — SARS CORONAVIRUS 2 (TAT 6-24 HRS): SARS Coronavirus 2: NEGATIVE

## 2020-05-20 ENCOUNTER — Ambulatory Visit
Admission: RE | Admit: 2020-05-20 | Discharge: 2020-05-20 | Disposition: A | Discharge status: Home / Self Care | Payer: BC Managed Care – PPO | Attending: Obstetrics & Gynecology | Admitting: Obstetrics & Gynecology

## 2020-05-20 ENCOUNTER — Ambulatory Visit: Payer: BC Managed Care – PPO | Admitting: Certified Registered Nurse Anesthetist

## 2020-05-20 ENCOUNTER — Encounter: Payer: Self-pay | Admitting: Obstetrics & Gynecology

## 2020-05-20 ENCOUNTER — Encounter
Admission: RE | Disposition: A | Discharge status: Home / Self Care | Payer: Self-pay | Source: Home / Self Care | Attending: Obstetrics & Gynecology

## 2020-05-20 ENCOUNTER — Other Ambulatory Visit: Payer: Self-pay | Admitting: Obstetrics & Gynecology

## 2020-05-20 DIAGNOSIS — Z20822 Contact with and (suspected) exposure to covid-19: Secondary | ICD-10-CM | POA: Diagnosis not present

## 2020-05-20 DIAGNOSIS — N814 Uterovaginal prolapse, unspecified: Secondary | ICD-10-CM | POA: Diagnosis present

## 2020-05-20 DIAGNOSIS — Z79899 Other long term (current) drug therapy: Secondary | ICD-10-CM | POA: Insufficient documentation

## 2020-05-20 DIAGNOSIS — N840 Polyp of corpus uteri: Secondary | ICD-10-CM | POA: Diagnosis not present

## 2020-05-20 DIAGNOSIS — N8111 Cystocele, midline: Secondary | ICD-10-CM | POA: Diagnosis present

## 2020-05-20 DIAGNOSIS — D259 Leiomyoma of uterus, unspecified: Secondary | ICD-10-CM | POA: Diagnosis not present

## 2020-05-20 HISTORY — PX: VAGINAL HYSTERECTOMY: SHX2639

## 2020-05-20 HISTORY — PX: CYSTOCELE REPAIR: SHX163

## 2020-05-20 LAB — ABO/RH: ABO/RH(D): A POS

## 2020-05-20 SURGERY — COLPORRHAPHY, ANTERIOR, FOR CYSTOCELE REPAIR
Anesthesia: General

## 2020-05-20 MED ORDER — POVIDONE-IODINE 10 % EX SWAB: 2.0000 "application " | Freq: Once | CUTANEOUS | Status: DC

## 2020-05-20 MED ORDER — LIDOCAINE-EPINEPHRINE 1 %-1:100000 IJ SOLN
INTRAMUSCULAR | Status: DC | PRN
  Administered 2020-05-20: 4 mL
  Administered 2020-05-20: 10 mL

## 2020-05-20 MED ORDER — KETOROLAC TROMETHAMINE 30 MG/ML IJ SOLN
INTRAMUSCULAR | Status: AC
  Filled 2020-05-20: qty 1

## 2020-05-20 MED ORDER — SODIUM CHLORIDE 0.9 % IV SOLN
INTRAVENOUS | Status: AC
  Filled 2020-05-20: qty 2

## 2020-05-20 MED ORDER — LIDOCAINE HCL (PF) 2 % IJ SOLN
INTRAMUSCULAR | Status: AC
  Filled 2020-05-20: qty 5

## 2020-05-20 MED ORDER — ACETAMINOPHEN 10 MG/ML IV SOLN
INTRAVENOUS | Status: DC | PRN
  Administered 2020-05-20: 1000 mg via INTRAVENOUS

## 2020-05-20 MED ORDER — OXYCODONE-ACETAMINOPHEN 5-325 MG PO TABS
1.0000 | ORAL_TABLET | ORAL | Status: DC | PRN
  Administered 2020-05-20: 1 via ORAL

## 2020-05-20 MED ORDER — FENTANYL CITRATE (PF) 100 MCG/2ML IJ SOLN
INTRAMUSCULAR | Status: AC
  Filled 2020-05-20: qty 2

## 2020-05-20 MED ORDER — DEXMEDETOMIDINE (PRECEDEX) IN NS 20 MCG/5ML (4 MCG/ML) IV SYRINGE
PREFILLED_SYRINGE | INTRAVENOUS | Status: AC
  Filled 2020-05-20: qty 5

## 2020-05-20 MED ORDER — ACETAMINOPHEN 650 MG RE SUPP
650.0000 mg | RECTAL | Status: DC | PRN
  Filled 2020-05-20: qty 1

## 2020-05-20 MED ORDER — FENTANYL CITRATE (PF) 100 MCG/2ML IJ SOLN
25.0000 ug | INTRAMUSCULAR | Status: DC | PRN
  Administered 2020-05-20 (×4): 25 ug via INTRAVENOUS

## 2020-05-20 MED ORDER — ROCURONIUM BROMIDE 100 MG/10ML IV SOLN
INTRAVENOUS | Status: DC | PRN
  Administered 2020-05-20: 20 mg via INTRAVENOUS
  Administered 2020-05-20: 50 mg via INTRAVENOUS

## 2020-05-20 MED ORDER — CHLORHEXIDINE GLUCONATE 0.12 % MT SOLN: 15.0000 mL | Freq: Once | OROMUCOSAL | Status: AC

## 2020-05-20 MED ORDER — ONDANSETRON HCL 4 MG/2ML IJ SOLN
INTRAMUSCULAR | Status: DC | PRN
  Administered 2020-05-20: 4 mg via INTRAVENOUS

## 2020-05-20 MED ORDER — KETOROLAC TROMETHAMINE 30 MG/ML IJ SOLN
INTRAMUSCULAR | Status: DC | PRN
  Administered 2020-05-20: 30 mg via INTRAVENOUS

## 2020-05-20 MED ORDER — EPHEDRINE 5 MG/ML INJ
INTRAVENOUS | Status: AC
  Filled 2020-05-20: qty 10

## 2020-05-20 MED ORDER — MORPHINE SULFATE (PF) 2 MG/ML IV SOLN: 1.0000 mg | INTRAVENOUS | Status: DC | PRN

## 2020-05-20 MED ORDER — CHLORHEXIDINE GLUCONATE 0.12 % MT SOLN
OROMUCOSAL | Status: AC
  Administered 2020-05-20: 15 mL via OROMUCOSAL
  Filled 2020-05-20: qty 15

## 2020-05-20 MED ORDER — LACTATED RINGERS IV SOLN: INTRAVENOUS | Status: DC

## 2020-05-20 MED ORDER — LIDOCAINE HCL (CARDIAC) PF 100 MG/5ML IV SOSY
PREFILLED_SYRINGE | INTRAVENOUS | Status: DC | PRN
  Administered 2020-05-20: 60 mg via INTRAVENOUS

## 2020-05-20 MED ORDER — ONDANSETRON HCL 4 MG/2ML IJ SOLN: 4.0000 mg | Freq: Once | INTRAMUSCULAR | Status: DC | PRN

## 2020-05-20 MED ORDER — ORAL CARE MOUTH RINSE: 15.0000 mL | Freq: Once | OROMUCOSAL | Status: AC

## 2020-05-20 MED ORDER — LACTATED RINGERS IV SOLN: INTRAVENOUS | Status: DC | PRN

## 2020-05-20 MED ORDER — ONDANSETRON HCL 4 MG/2ML IJ SOLN
INTRAMUSCULAR | Status: AC
  Filled 2020-05-20: qty 2

## 2020-05-20 MED ORDER — OXYCODONE-ACETAMINOPHEN 5-325 MG PO TABS: 1.0000 | ORAL_TABLET | ORAL | 0 refills | Status: DC | PRN

## 2020-05-20 MED ORDER — EPHEDRINE SULFATE-NACL 50-0.9 MG/10ML-% IV SOSY
PREFILLED_SYRINGE | INTRAVENOUS | Status: DC | PRN
  Administered 2020-05-20 (×2): 5 mg via INTRAVENOUS

## 2020-05-20 MED ORDER — OXYCODONE-ACETAMINOPHEN 5-325 MG PO TABS
ORAL_TABLET | ORAL | Status: AC
  Filled 2020-05-20: qty 1

## 2020-05-20 MED ORDER — PROPOFOL 10 MG/ML IV BOLUS
INTRAVENOUS | Status: DC | PRN
  Administered 2020-05-20: 130 mg via INTRAVENOUS

## 2020-05-20 MED ORDER — ESTROGENS, CONJUGATED 0.625 MG/GM VA CREA
TOPICAL_CREAM | VAGINAL | Status: DC | PRN
  Administered 2020-05-20: 1 via VAGINAL

## 2020-05-20 MED ORDER — PROPOFOL 10 MG/ML IV BOLUS
INTRAVENOUS | Status: AC
  Filled 2020-05-20: qty 40

## 2020-05-20 MED ORDER — FAMOTIDINE 20 MG PO TABS
ORAL_TABLET | ORAL | Status: AC
  Administered 2020-05-20: 20 mg via ORAL
  Filled 2020-05-20: qty 1

## 2020-05-20 MED ORDER — ACETAMINOPHEN 10 MG/ML IV SOLN
INTRAVENOUS | Status: AC
  Filled 2020-05-20: qty 100

## 2020-05-20 MED ORDER — ROCURONIUM BROMIDE 10 MG/ML (PF) SYRINGE
PREFILLED_SYRINGE | INTRAVENOUS | Status: AC
  Filled 2020-05-20: qty 10

## 2020-05-20 MED ORDER — DEXMEDETOMIDINE (PRECEDEX) IN NS 20 MCG/5ML (4 MCG/ML) IV SYRINGE
PREFILLED_SYRINGE | INTRAVENOUS | Status: DC | PRN
  Administered 2020-05-20: 8 ug via INTRAVENOUS
  Administered 2020-05-20: 12 ug via INTRAVENOUS

## 2020-05-20 MED ORDER — SUGAMMADEX SODIUM 200 MG/2ML IV SOLN
INTRAVENOUS | Status: DC | PRN
  Administered 2020-05-20: 150 mg via INTRAVENOUS

## 2020-05-20 MED ORDER — FENTANYL CITRATE (PF) 100 MCG/2ML IJ SOLN
INTRAMUSCULAR | Status: DC | PRN
  Administered 2020-05-20: 100 ug via INTRAVENOUS

## 2020-05-20 MED ORDER — FAMOTIDINE 20 MG PO TABS: 20.0000 mg | ORAL_TABLET | Freq: Once | ORAL | Status: AC

## 2020-05-20 MED ORDER — DEXAMETHASONE SODIUM PHOSPHATE 10 MG/ML IJ SOLN
INTRAMUSCULAR | Status: AC
  Filled 2020-05-20: qty 1

## 2020-05-20 MED ORDER — ACETAMINOPHEN 325 MG PO TABS: 650.0000 mg | ORAL_TABLET | ORAL | Status: DC | PRN

## 2020-05-20 MED ORDER — DEXAMETHASONE SODIUM PHOSPHATE 10 MG/ML IJ SOLN
INTRAMUSCULAR | Status: DC | PRN
  Administered 2020-05-20: 10 mg via INTRAVENOUS

## 2020-05-20 MED ORDER — SODIUM CHLORIDE 0.9 % IV SOLN
2.0000 g | INTRAVENOUS | Status: AC
  Administered 2020-05-20: 2 g via INTRAVENOUS

## 2020-05-20 SURGICAL SUPPLY — 47 items
BAG DRN RND TRDRP ANRFLXCHMBR (UROLOGICAL SUPPLIES) ×1
BAG URINE DRAIN 2000ML AR STRL (UROLOGICAL SUPPLIES) ×2 IMPLANT
BLADE SURG SZ10 CARB STEEL (BLADE) ×2 IMPLANT
CATH FOLEY 2WAY  5CC 16FR (CATHETERS) ×2
CATH FOLEY 2WAY 5CC 16FR (CATHETERS) ×1
CATH URTH 16FR FL 2W BLN LF (CATHETERS) ×1 IMPLANT
COUNTER NEEDLE 20/40 LG (NEEDLE) ×1 IMPLANT
COVER WAND RF STERILE (DRAPES) IMPLANT
DRAPE 3/4 80X56 (DRAPES) ×2 IMPLANT
DRAPE PERI LITHO V/GYN (MISCELLANEOUS) ×2 IMPLANT
DRAPE UNDER BUTTOCK W/FLU (DRAPES) ×2 IMPLANT
ELECT CAUTERY BLADE 6.4 (BLADE) ×2 IMPLANT
ELECT REM PT RETURN 9FT ADLT (ELECTROSURGICAL) ×2
ELECTRODE REM PT RTRN 9FT ADLT (ELECTROSURGICAL) ×1 IMPLANT
GAUZE 4X4 16PLY RFD (DISPOSABLE) ×3 IMPLANT
GAUZE PACK 2X3YD (PACKING) ×1 IMPLANT
GAUZE PACKING FOLDED 1IN STRL (GAUZE/BANDAGES/DRESSINGS) ×1 IMPLANT
GLOVE BIO SURGEON STRL SZ8 (GLOVE) ×5 IMPLANT
GOWN STRL REUS W/ TWL LRG LVL3 (GOWN DISPOSABLE) ×1 IMPLANT
GOWN STRL REUS W/ TWL XL LVL3 (GOWN DISPOSABLE) ×1 IMPLANT
GOWN STRL REUS W/TWL LRG LVL3 (GOWN DISPOSABLE) ×6
GOWN STRL REUS W/TWL XL LVL3 (GOWN DISPOSABLE) ×2
LABEL OR SOLS (LABEL) ×2 IMPLANT
MANIFOLD NEPTUNE II (INSTRUMENTS) ×2 IMPLANT
NDL MAYO CATGUT SZ4 (NEEDLE) ×2 IMPLANT
NDL MAYO CATGUT SZ4 TCR NDL (NEEDLE) ×1 IMPLANT
NEEDLE HYPO 22GX1.5 SAFETY (NEEDLE) ×2 IMPLANT
NS IRRIG 500ML POUR BTL (IV SOLUTION) ×2 IMPLANT
PACK BASIN MINOR ARMC (MISCELLANEOUS) ×2 IMPLANT
PAD OB MATERNITY 4.3X12.25 (PERSONAL CARE ITEMS) ×2 IMPLANT
PAD PREP 24X41 OB/GYN DISP (PERSONAL CARE ITEMS) ×2 IMPLANT
RETRACTOR PHONTONGUIDE ADAPT (ADAPTER) IMPLANT
SOL PREP PVP 2OZ (MISCELLANEOUS) ×2
SOLUTION PREP PVP 2OZ (MISCELLANEOUS) ×1 IMPLANT
SPONGE LAP 4X18 RFD (DISPOSABLE) ×2 IMPLANT
STRAP SAFETY 5IN WIDE (MISCELLANEOUS) ×2 IMPLANT
SURGILUBE 2OZ TUBE FLIPTOP (MISCELLANEOUS) ×2 IMPLANT
SUT ETHIBOND NAB CT1 #1 30IN (SUTURE) ×2 IMPLANT
SUT VIC AB 0 CT1 27 (SUTURE) ×6
SUT VIC AB 0 CT1 27XCR 8 STRN (SUTURE) ×2 IMPLANT
SUT VIC AB 0 CT1 36 (SUTURE) ×2 IMPLANT
SUT VIC AB 1 CT1 36 (SUTURE) ×3 IMPLANT
SUT VIC AB 2-0 CT1 (SUTURE) ×2 IMPLANT
SYR 10ML LL (SYRINGE) ×4 IMPLANT
SYR BULB IRRIG 60ML STRL (SYRINGE) ×2 IMPLANT
SYR CONTROL 10ML LL (SYRINGE) ×2 IMPLANT
TAPE TRANSPORE STRL 2 31045 (GAUZE/BANDAGES/DRESSINGS) ×2 IMPLANT

## 2020-05-20 NOTE — Progress Notes (Signed)
°   05/20/20 0800  Clinical Encounter Type  Visited With Patient and family together  Visit Type Initial  Referral From Chaplain  Consult/Referral To Chaplain  While rounding [facility] waiting area, chaplain briefly visited with Pt and her husband. Shortly after meeting them staff came out and took Pt to the back. Chaplain and Pt's husband continued talking.

## 2020-05-20 NOTE — OR Nursing (Signed)
1200: Vaginal packing rmoved.  Minimal vaginal bleeding noted.  Instilled 300cc of sterile water  into bladder and foley removed. Voided 250cc immediately.

## 2020-05-20 NOTE — Anesthesia Preprocedure Evaluation (Signed)
Anesthesia Evaluation  Patient identified by MRN, date of birth, ID band Patient awake    Reviewed: Allergy & Precautions, H&P , NPO status , Patient's Chart, lab work & pertinent test results, reviewed documented beta blocker date and time   Airway Mallampati: III  TM Distance: >3 FB Neck ROM: full    Dental  (+) Teeth Intact   Pulmonary neg pulmonary ROS,    Pulmonary exam normal        Cardiovascular Exercise Tolerance: Good hypertension, On Medications negative cardio ROS Normal cardiovascular exam Rhythm:regular Rate:Normal     Neuro/Psych negative neurological ROS  negative psych ROS   GI/Hepatic negative GI ROS, Neg liver ROS,   Endo/Other  negative endocrine ROS  Renal/GU negative Renal ROS  negative genitourinary   Musculoskeletal   Abdominal   Peds  Hematology negative hematology ROS (+)   Anesthesia Other Findings Past Medical History: No date: Eczema 10/18/2014: Family history of ovarian cancer     Comment:  MyRisk neg; sister 09/2014: Genetic testing of female     Comment:  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 BYRNETT c colonoscopy 10/18/14; 11/05/15: History of mammogram     Comment:  scattered fibroglandular densities, saw surgeon;               incomplete-add views - stable; 10/18/2014: History of Papanicolaou smear of cervix     Comment:  -/- No date: Hypertension No date: Psoriasis 09/2015: Shingles Past Surgical History: 10/2014: BREAST BIOPSY; Left     Comment:  negative core 11/20/2014: COLONOSCOPY; N/A     Comment:  Procedure: COLONOSCOPY;  Surgeon: Jeffrey W Byrnett, MD;              Location: [facility] ENDOSCOPY;  Service: Endoscopy;                Laterality: N/A; 11/20/2014: HEMORRHOID SURGERY; N/A     Comment:  Procedure: HEMORRHOIDECTOMY;  Surgeon: Jeffrey W               Byrnett, MD;  Location: [facility] ORS;  Service: General;                 Laterality: N/A;   Reproductive/Obstetrics negative OB ROS                             Anesthesia Physical Anesthesia Plan  ASA: II  Anesthesia Plan: General ETT   Post-op Pain Management:    Induction:   PONV Risk Score and Plan:   Airway Management Planned:   Additional Equipment:   Intra-op Plan:   Post-operative Plan:   Informed Consent: I have reviewed the patients History and Physical, chart, labs and discussed the procedure including the risks, benefits and alternatives for the proposed anesthesia with the patient or authorized representative who has indicated his/her understanding and acceptance.     Dental Advisory Given  Plan Discussed with: CRNA  Anesthesia Plan Comments:         Anesthesia Quick Evaluation  

## 2020-05-20 NOTE — Transfer of Care (Signed)
Immediate Anesthesia Transfer of Care Note  Patient: Amber Henson  Procedure(s) Performed: ANTERIOR COLPORRHAPHY (N/A ) HYSTERECTOMY VAGINAL (N/A )  Patient Location: PACU  Anesthesia Type:General  Level of Consciousness: awake, drowsy and patient cooperative  Airway & Oxygen Therapy: Patient Spontanous Breathing and Patient connected to nasal cannula oxygen  Post-op Assessment: Report given to RN  Post vital signs: Reviewed and stable  Last Vitals:  Vitals Value Taken Time  BP 115/73 05/20/20 1039  Temp 36.1 C 05/20/20 1039  Pulse 72 05/20/20 1041  Resp 12 05/20/20 1041  SpO2 100 % 05/20/20 1041  Vitals shown include unvalidated device data.  Last Pain:  Vitals:   05/20/20 1039  TempSrc:   PainSc: Asleep         Complications: No complications documented.

## 2020-05-20 NOTE — Interval H&P Note (Signed)
History and Physical Interval Note:  05/20/2020 8:35 AM  Amber Henson  has presented today for surgery, with the diagnosis of Uterine prolapse N81.4 Cystocele, midline N81.11.  The various methods of treatment have been discussed with the patient and family. After consideration of risks, benefits and other options for treatment, the patient has consented to  Procedure(s): ANTERIOR COLPORRHAPHY (N/A) HYSTERECTOMY VAGINAL (N/A) as a surgical intervention.  The patient's history has been reviewed, patient examined, no change in status, stable for surgery.  I have reviewed the patient's chart and labs.  Questions were answered to the patient's satisfaction.     Letitia Libra

## 2020-05-20 NOTE — Anesthesia Procedure Notes (Signed)
Procedure Name: Intubation Date/Time: 05/20/2020 9:17 AM Performed by: Gayland Curry, CRNA Pre-anesthesia Checklist: Patient identified, Emergency Drugs available, Suction available and Patient being monitored Patient Re-evaluated:Patient Re-evaluated prior to induction Oxygen Delivery Method: Circle system utilized Preoxygenation: Pre-oxygenation with 100% oxygen Induction Type: IV induction Ventilation: Mask ventilation without difficulty Laryngoscope Size: Mac and 3 Grade View: Grade I Tube type: Oral Tube size: 7.0 mm Number of attempts: 1 Placement Confirmation: ETT inserted through vocal cords under direct vision,  positive ETCO2 and breath sounds checked- equal and bilateral Secured at: 21 cm Tube secured with: Tape Dental Injury: Teeth and Oropharynx as per pre-operative assessment

## 2020-05-20 NOTE — Op Note (Signed)
Preoperative diagnosis: Uterine prolapse and cystocele  Postoperative diagnosis: Same  Procedure: Transvaginal hysterectomy and Anterior Colporrhaphy, Modified McCall Culdoplasty  Surgeon: Annamarie Major, M.D.  Assistant: Edison Pace, MD, No other capable assistant available, in surgery requiring high level assistant.  Anesthesia: general  Findings: Fibroid uterus, atrophic ovaries visualized, cystocele and prolapse of uterus to introitus.  Estimated blood loss: 50 cc  Specimen: Uterus to pathology   Disposition: Tolerated procedure well  Procedure: Patient was taken to the OR where she was placed in dorsal lithotomy in [place]ane stirrups. She was prepped and draped in the usual sterile fashion. A timeout was performed. Foley is placed into bladder. A speculum was placed inside the vagina. The cervix was visualized and grasped with a tenaculum. 1% lidocaine with epinephrine were injected paracervically. A bovie was used to make a circumferential incision around the vagina. An opened sponge was used to dissect the vagina off the cervix. The posterior peritoneum was entered sharply with Mayo scissors.  The anterior peritoneal cavity was entered sharply with careful dissection of the bladder off the underlying cervix. A Heaney clamp was used to clamp first the left uterosacral ligament and cardinal which was then cut and Haney suture ligated with 0 Vicryl stitch, the stitch was held and later attached to the vaginal mucosa. Similarly the right uterosacral ligament was clamped cut and suture ligated.  Sequential clamping, transecting and suture ligating up the broad to the uterine arteries were taken until the tubo-ovarian pedicles were encountered. The uterus was then amputated.  Inspection of all pedicles revealed adequate hemostasis.  The peritoneum was then closed with a running pursestring suture of 0 Vicryl. The uterosacrals were plicated using a 1-0 Ethibond suture.  Vagina is  irrigated.  Anterior colporrhaphy is performed.  Allis clamps are placed along the anterior vaginal wall, lidocaine is used to infiltrate the plane, and incision is made midline vertical.  Endopelvic fascia is dissected free of vaginal mucosa.  Fascia is plicated w interrupted vicryl sutures.  Excess mucosa is excised.  Vaginal incision is closed with a running locking Vicryl suture, to incoprate the hysterectomy incision as well, with care taken to incorporate the uterosacral pedicles. Excellent hemostasis was noted at the end of the case. The vaginal cuff was inspected there was minimal bleeding noted.  Packing gauze w Premarin cream is placed. A Foley catheter is left in  place inside her bladder. Clear, yellow urine was noted. All instrument needle and lap counts were correct x 2. Patient was awakened taken to recovery room in stable condition.  Letitia Libra, MD 05/20/2020, 10:38 AM

## 2020-05-20 NOTE — Discharge Instructions (Addendum)
Vaginal Hysterectomy, Care After Refer to this sheet in the next few weeks. These instructions provide you with information about caring for yourself after your procedure. Your health care provider may also give you more specific instructions. Your treatment has been planned according to current medical practices, but problems sometimes occur. Call your health care provider if you have any problems or questions after your procedure. What can I expect after the procedure? After the procedure, it is common to have:  Pain.  Soreness and numbness in your incision areas.  Vaginal bleeding and discharge.  Constipation.  Temporary problems emptying the bladder.  Feelings of sadness or other emotions. Follow these instructions at home: Medicines  Take over-the-counter and prescription medicines only as told by your health care provider.  If you were prescribed an antibiotic medicine, take it as told by your health care provider. Do not stop taking the antibiotic even if you start to feel better.  Do not drive or operate heavy machinery while taking prescription pain medicine. Activity  Return to your normal activities as told by your health care provider. Ask your health care provider what activities are safe for you.  Get regular exercise as told by your health care provider. You may be told to take short walks every day and go farther each time.  Do not lift anything that is heavier than 10 lb (4.5 kg). General instructions   Do not put anything in your vagina for 6 weeks after your surgery or as told by your health care provider. This includes tampons and douches.  Do not have sex until your health care provider says you can.  Do not take baths, swim, or use a hot tub until your health care provider approves.  Drink enough fluid to keep your urine clear or pale yellow.  Do not drive for 24 hours if you were given a sedative.  Keep all follow-up visits as told by your health  care provider. This is important. Contact a health care provider if:  Your pain medicine is not helping.  You have a fever.  You have redness, swelling, or pain at your incision site.  You have blood, pus, or a bad-smelling discharge from your vagina.  You continue to have difficulty urinating. Get help right away if:  You have severe abdominal or back pain.  You have heavy bleeding from your vagina.  You have chest pain or shortness of breath. This information is not intended to replace advice given to you by your health care provider. Make sure you discuss any questions you have with your health care provider. Document Revised: 01/01/2016 Document Reviewed: 05/25/2015 Elsevier Patient Education  2020 Else[employer] Inc[employer]   AMBULATORY SURGERY  DISCHARGE INSTRUCTIONS   1) The drugs that you were given will stay in your system until tomorrow so for the next 24 hours you should not:  A) Drive an automobile B) Make any legal decisions C) Drink any alcoholic beverage   2) You may resume regular meals tomorrow.  Today it is better to start with liquids and gradually work up to solid foods.  You may eat anything you prefer, but it is better to start with liquids, then soup and crackers, and gradually work up to solid foods.   3) Please notify your doctor immediately if you have any unusual bleeding, trouble breathing, redness and pain at the surgery site, drainage, fever, or pain not relieved by medication.    4) Additional Instructions:          Please contact your physician with any problems or Same Day Surgery at [phone], Monday through Friday 6 am to 4 pm, or [facility] number at [phone]. 

## 2020-05-21 ENCOUNTER — Encounter: Payer: Self-pay | Admitting: Obstetrics & Gynecology

## 2020-05-21 LAB — SURGICAL PATHOLOGY

## 2020-05-21 NOTE — Anesthesia Postprocedure Evaluation (Signed)
Anesthesia Post Note  Patient: Amber Henson  Procedure(s) Performed: ANTERIOR COLPORRHAPHY (N/A ) HYSTERECTOMY VAGINAL (N/A )  Patient location during evaluation: PACU Anesthesia Type: General Level of consciousness: awake and alert Pain management: pain level controlled Vital Signs Assessment: post-procedure vital signs reviewed and stable Respiratory status: spontaneous breathing, nonlabored ventilation, respiratory function stable and patient connected to nasal cannula oxygen Cardiovascular status: blood pressure returned to baseline and stable Postop Assessment: no apparent nausea or vomiting Anesthetic complications: no   No complications documented.   Last Vitals:  Vitals:   05/20/20 1143 05/20/20 1303  BP:  116/70  Pulse: (P) 73 71  Resp: (P) 16 16  Temp: (P) 36.6 C   SpO2: (P) 100% 99%    Last Pain:  Vitals:   05/21/20 0822  TempSrc:   PainSc: 4                  Yevette Edwards

## 2020-05-25 DIAGNOSIS — Z20822 Contact with and (suspected) exposure to covid-19: Secondary | ICD-10-CM | POA: Diagnosis not present

## 2020-05-25 DIAGNOSIS — R059 Cough, unspecified: Secondary | ICD-10-CM | POA: Diagnosis not present

## 2020-05-25 DIAGNOSIS — U071 COVID-19: Secondary | ICD-10-CM | POA: Diagnosis not present

## 2020-06-03 ENCOUNTER — Other Ambulatory Visit: Payer: Self-pay

## 2020-06-03 ENCOUNTER — Encounter: Payer: Self-pay | Admitting: Obstetrics & Gynecology

## 2020-06-03 ENCOUNTER — Ambulatory Visit (INDEPENDENT_AMBULATORY_CARE_PROVIDER_SITE_OTHER): Payer: BC Managed Care – PPO | Admitting: Obstetrics & Gynecology

## 2020-06-03 ENCOUNTER — Telehealth: Payer: Self-pay

## 2020-06-03 DIAGNOSIS — Z90710 Acquired absence of both cervix and uterus: Secondary | ICD-10-CM

## 2020-06-03 NOTE — Telephone Encounter (Signed)
-----   Message from Gae Dry, MD sent at 06/03/2020 10:30 AM EST ----- Regarding: appt POST OP in 4 weeks

## 2020-06-03 NOTE — Progress Notes (Signed)
Virtual Visit via Telephone Note  I connected with Amber Henson on 06/03/20 at 10:20 AM EST by telephone and verified that I am speaking with the correct person using two identifiers.  Location: Patient: Home Provider: Office   I discussed the limitations, risks, security and privacy concerns of performing an evaluation and management service by telephone and the availability of in person appointments. I also discussed with the patient that there may be a patient responsible charge related to this service. The patient expressed understanding and agreed to proceed.  History of Present Illness: Patient presents post op from vaginal hysterectomy for pelvic relaxation, 2 weeks ago.  Path: DIAGNOSIS:  A. UTERUS WITH CERVIX; TOTAL HYSTERECTOMY:  - CERVIX:    - NEGATIVE FOR DYSPLASIA AND MALIGNANCY.  - ENDOMETRIUM:    - BENIGN ENDOMETRIAL POLYP. DISORDERED PROLIFERATIVE ENDOMETRIUM.    - NEGATIVE FOR ATYPIA / EIN AND MALIGNANCY.  - MYOMETRIUM:    - LEIOMYOMATA.   Subjective: Patient reports marked improvement in her preop symptoms. Eating a regular diet without difficulty. Pain is controlled with current analgesics. Medications being used: ibuprofen (OTC).  Activity: normal activities of daily living. Patient reports additional symptom's since surgery of None.   Observations/Objective: No exam today, due to telephone eVisit due to [facility] virus restriction on elective visits and procedures.  Prior visits reviewed along with ultrasounds/labs as indicated.  Assessment and Plan:   ICD-10-CM   1. S/P vaginal hysterectomy  Z90.710    Follow Up Instructions:  Assessment: s/p :  vaginal hysterectomy progressing well  Plan: Patient has done well after surgery with no apparent complications.  I have discussed the post-operative course to date, and the expected progress moving forward.  The patient understands what complications to be concerned about.  I will see the patient in  routine follow up, or sooner if needed.    Activity plan: No heavy lifting.Marland Kitchen  Pelvic rest. Exam and visit in 4 weeks   I discussed the assessment and treatment plan with the patient. The patient was provided an opportunity to ask questions and all were answered. The patient agreed with the plan and demonstrated an understanding of the instructions.   The patient was advised to call back or seek an in-person evaluation if the symptoms worsen or if the condition fails to improve as anticipated.  I provided 14 minutes of non-face-to-face time during this encounter.   Hoyt Koch, MD

## 2020-06-03 NOTE — Telephone Encounter (Signed)
Called and left generic message to have patient call back to confirm scheduled appointment

## 2020-07-01 ENCOUNTER — Ambulatory Visit: Payer: BC Managed Care – PPO | Admitting: Obstetrics & Gynecology

## 2020-07-03 ENCOUNTER — Ambulatory Visit (INDEPENDENT_AMBULATORY_CARE_PROVIDER_SITE_OTHER): Payer: BC Managed Care – PPO | Admitting: Obstetrics & Gynecology

## 2020-07-03 ENCOUNTER — Other Ambulatory Visit: Payer: Self-pay

## 2020-07-03 ENCOUNTER — Encounter: Payer: Self-pay | Admitting: Obstetrics & Gynecology

## 2020-07-03 VITALS — BP 120/80 | Ht 65.0 in | Wt 164.0 lb

## 2020-07-03 DIAGNOSIS — Z90710 Acquired absence of both cervix and uterus: Secondary | ICD-10-CM

## 2020-07-03 NOTE — Progress Notes (Signed)
  Postoperative Follow-up Patient presents post op from vaginal hysterectomy and anterior colporrhaphy for pelvic relaxation, 6 weeks ago.  Subjective: Patient reports marked improvement in her preop symptoms. Eating a regular diet without difficulty. The patient is not having any pain.  Activity: normal activities of daily living. Patient reports additional symptom's since surgery of None.  Objective: BP 120/80   Ht 5\' 5"  (1.651 m)   Wt 164 lb (74.4 kg)   BMI 27.29 kg/m  Physical Exam Constitutional:      General: She is not in acute distress.    Appearance: She is well-developed.  Genitourinary:     Bladder, vagina and urethral meatus normal.     Genitourinary Comments: Cuff intact/ no lesions  Absent uterus and cervix     Vaginal cuff intact.    Perineal sutures intact.    No vaginal erythema or bleeding.     No vaginal prolapse present.    Mild vaginal atrophy present.    Pelvic exam was performed with patient in the lithotomy position.  HENT:     Head: Normocephalic and atraumatic.     Nose: Nose normal.  Abdominal:     General: There is no distension.     Palpations: Abdomen is soft.     Tenderness: There is no abdominal tenderness.  Musculoskeletal:        General: Normal range of motion.  Neurological:     Mental Status: She is alert and oriented to person, place, and time.     Cranial Nerves: No cranial nerve deficit.  Skin:    General: Skin is warm and dry.  Psychiatric:        Attention and Perception: Attention normal.        Mood and Affect: Mood normal.        Speech: Speech normal.        Behavior: Behavior normal.        Cognition and Memory: Cognition normal.        Judgment: Judgment normal.     Assessment: s/p :  vaginal hysterectomy and anterior colporrhaphy progressing well  Plan: Patient has done well after surgery with no apparent complications.  I have discussed the post-operative course to date, and the expected progress moving forward.   The patient understands what complications to be concerned about.  I will see the patient in routine follow up, or sooner if needed.    Activity plan: No restriction.  Hoyt Koch 07/03/2020, 9:42 AM

## 2021-06-03 IMAGING — US US BREAST*R* LIMITED INC AXILLA
1 series · 11 of 11 positions shown · non-contrast
Comparison: Previous exam(s).

CLINICAL DATA: The patient presents as a recall from screen for a
possible right breast mass.

EXAM:
DIGITAL DIAGNOSTIC RIGHT MAMMOGRAM WITH CAD AND TOMO
ULTRASOUND RIGHT BREAST

[Series 1: us breast*right* limited inc axilla · 0.07mm/px · 11 of 11 slices shown]
[im 1/11]
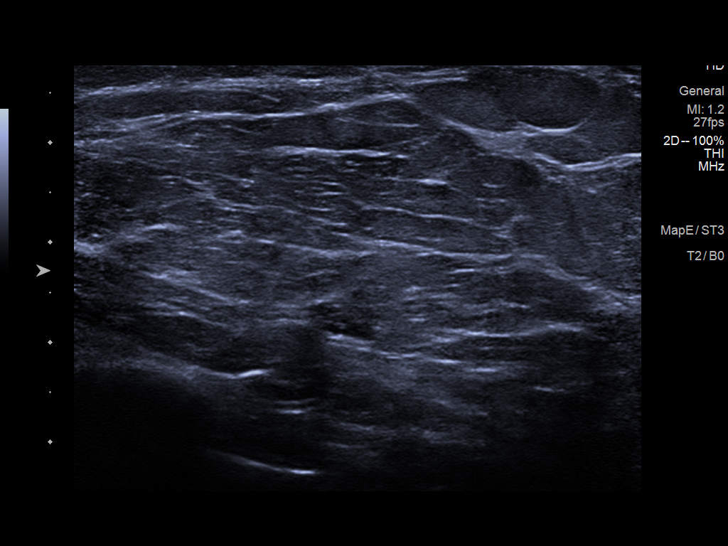
[im 2/11]
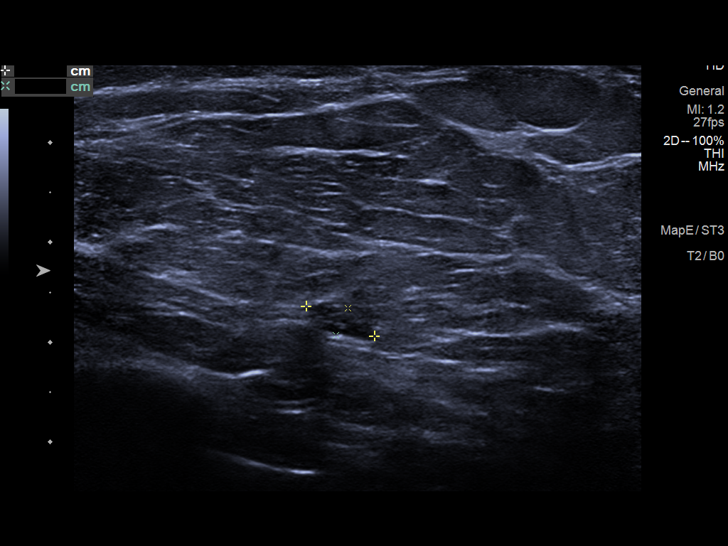
[im 3/11]
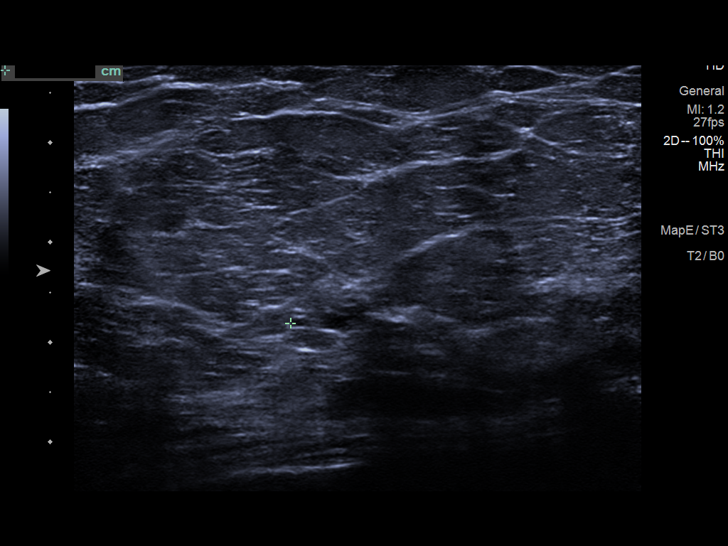
[im 4/11]
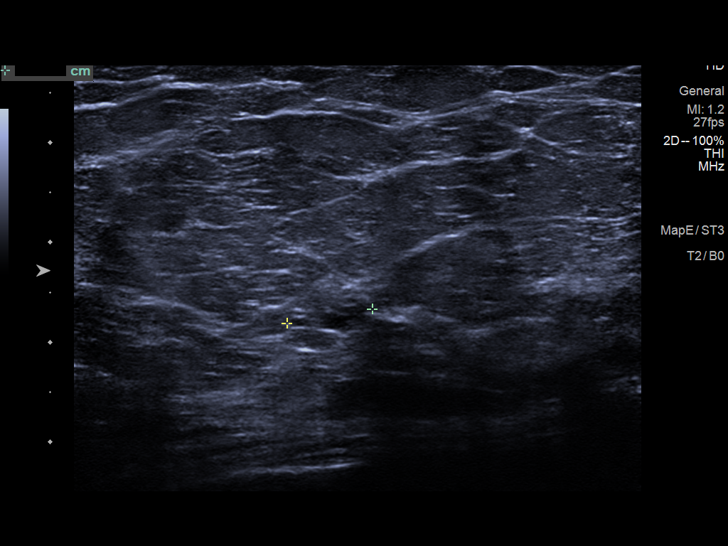
[im 5/11]
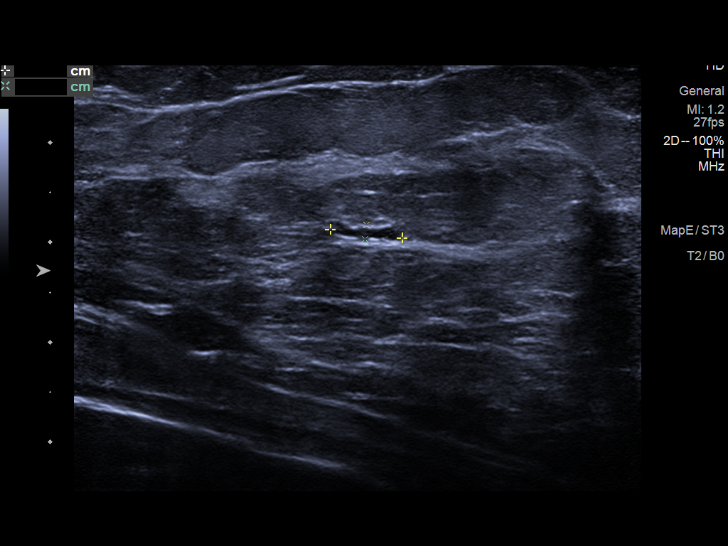
[im 6/11]
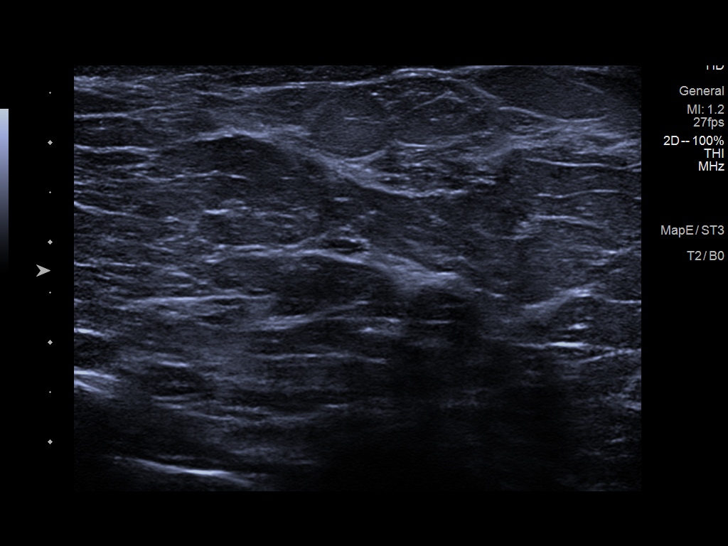
[im 7/11]
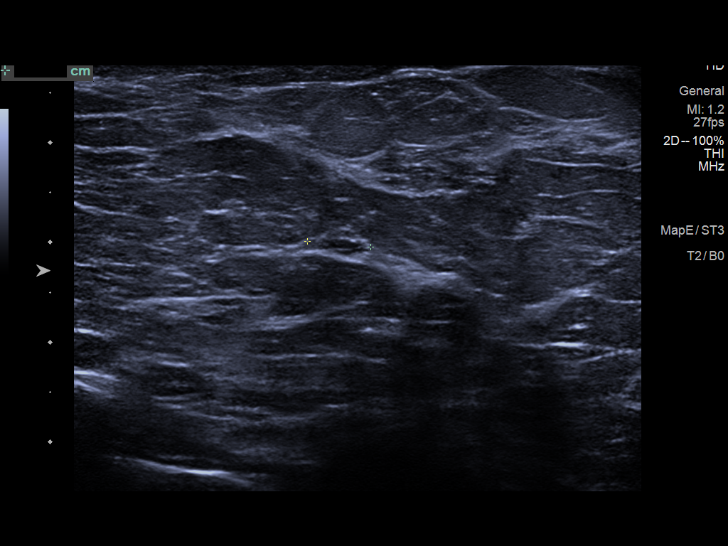
[im 8/11]
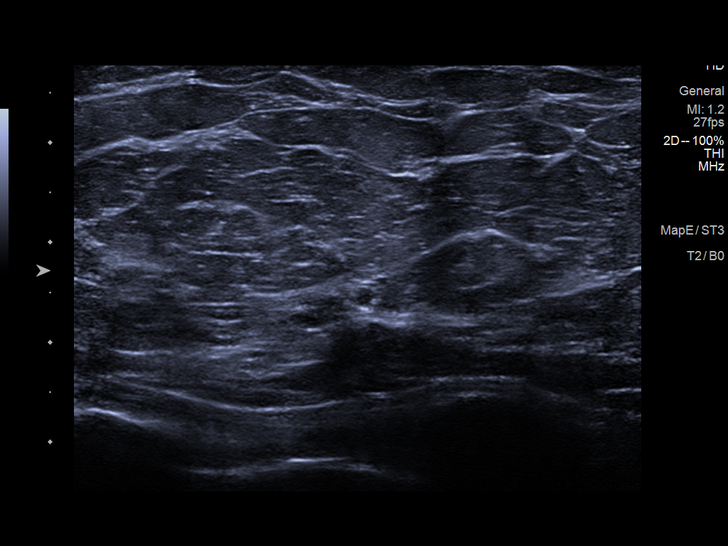
[im 9/11]
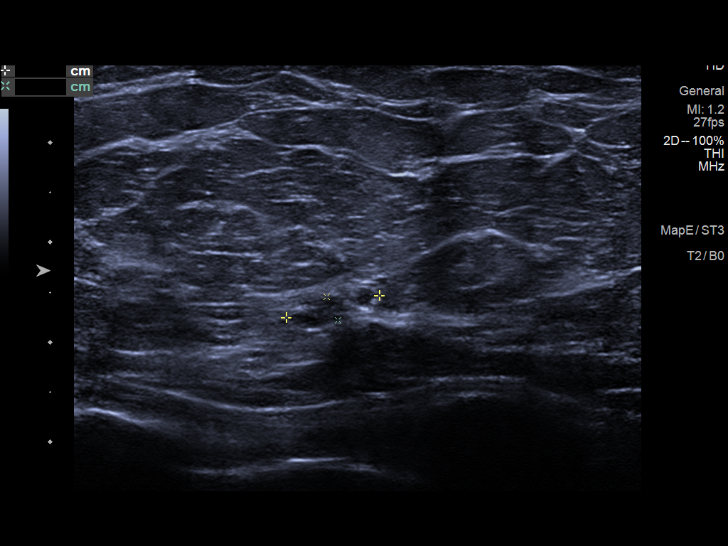
[im 10/11]
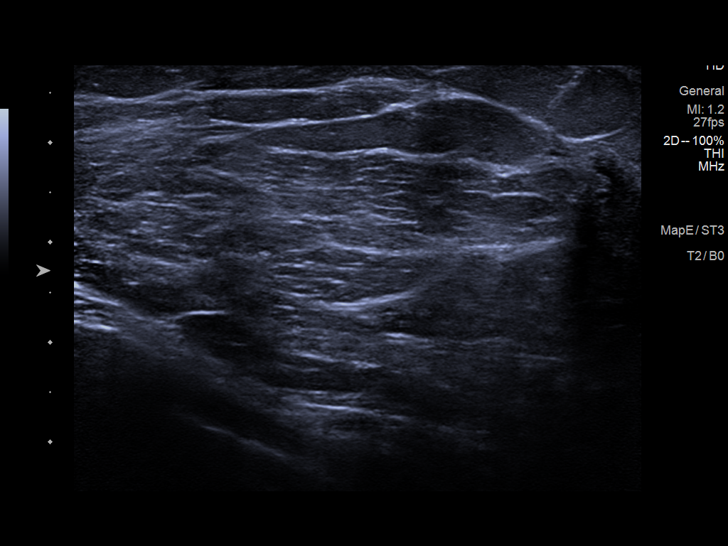
[im 11/11]
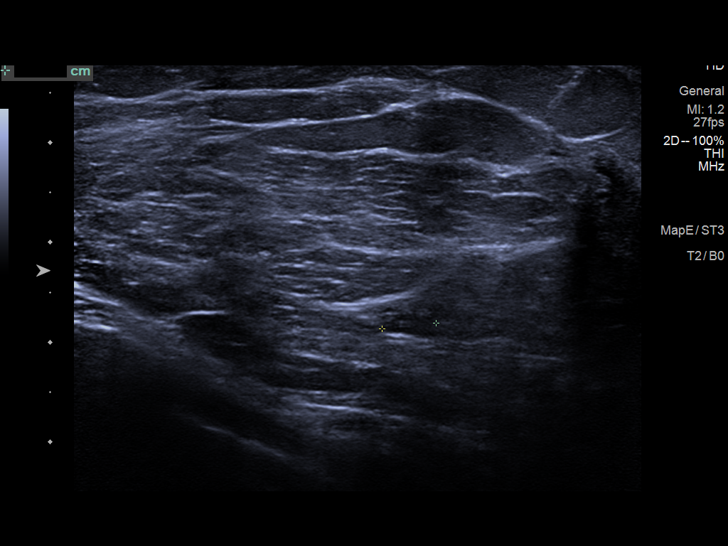

[11 of 11 positions shown; findings below may reference images not displayed]

ACR Breast Density Category b: There are scattered areas of
fibroglandular density.
FINDINGS: Mammogram: Additional spot compression tomosynthesis views were
performed for the questioned mass in the right breast. On the
additional imaging there is persistence of at least two masses in
the lower outer and upper outer right breast.

Ultrasound: Targeted ultrasound of the right breast at 8 o'clock 5
cm from the nipple demonstrates an oval mass with septation
consistent with a cluster of cysts measuring 0.8 x 0.3 x 0.9 cm.
Targeted ultrasound at 10 o'clock 5 cm from the nipple demonstrates
a similar appearing mass measuring 1.0 x 0.3 x 0.6 cm. Additionally
at 9 o'clock 4 cm from nipple there is another cluster of cysts or
adjacent cysts measuring overall 1.0 x 0.3 x 0.5 cm. Additional
small scattered cysts are incidentally noted in the outer aspect of
the right breast.
IMPRESSION: Numerous small cysts and clusters of cysts are identified in the
outer right breast, the largest of which at 8 o'clock measuring
cm and at 10 o'clock measuring 1.0 cm most likely correspond to the
mammographic findings and are probably benign.

RECOMMENDATION:
Diagnostic right breast mammogram in 6 months to document stability
of the probably benign masses in the outer right breast.

I have discussed the findings and recommendations with the patient.
If applicable, a reminder letter will be sent to the patient
regarding the next appointment.

BI-RADS CATEGORY  3: Probably benign.

## 2021-07-02 ENCOUNTER — Telehealth: Payer: Self-pay

## 2021-07-02 NOTE — Telephone Encounter (Signed)
Spoke with patient about scheduling appointment ,, she will not be available to talk with until after 430p - would like a call back.

## 2021-07-08 ENCOUNTER — Ambulatory Visit (INDEPENDENT_AMBULATORY_CARE_PROVIDER_SITE_OTHER): Payer: 59

## 2021-07-08 ENCOUNTER — Ambulatory Visit: Payer: 59 | Admitting: Podiatry

## 2021-07-08 ENCOUNTER — Other Ambulatory Visit: Payer: Self-pay

## 2021-07-08 DIAGNOSIS — B351 Tinea unguium: Secondary | ICD-10-CM

## 2021-07-08 DIAGNOSIS — S9001XA Contusion of right ankle, initial encounter: Secondary | ICD-10-CM | POA: Diagnosis not present

## 2021-07-08 DIAGNOSIS — M7671 Peroneal tendinitis, right leg: Secondary | ICD-10-CM

## 2021-07-08 DIAGNOSIS — M79675 Pain in left toe(s): Secondary | ICD-10-CM

## 2021-07-08 DIAGNOSIS — M79674 Pain in right toe(s): Secondary | ICD-10-CM

## 2021-07-08 MED ORDER — MELOXICAM 15 MG PO TABS: 15.0000 mg | ORAL_TABLET | Freq: Every day | ORAL | 1 refills | Status: DC

## 2021-07-08 MED ORDER — METHYLPREDNISOLONE 4 MG PO TBPK: ORAL_TABLET | ORAL | 0 refills | Status: DC

## 2021-07-08 NOTE — Progress Notes (Signed)
° °  HPI: 61 y.o. female presenting today as a new patient for evaluation of pain and tenderness associated to the lateral aspect of the right foot for the past 2-3 years intermittently. Patient states that her ankle feels 'weak'. She has been experiencing swelling and increased pain. She denies a history of trauma. 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 BYRNETT c colonoscopy   History of mammogram 10/18/14; 11/05/15   scattered fibroglandular densities, saw surgeon; incomplete-add views - stable;   History of Papanicolaou smear of cervix 10/18/2014   -/-   Hypertension    Psoriasis    Shingles 09/2015    Past Surgical History:  Procedure Laterality Date   BREAST BIOPSY Left 10/2014   negative core   COLONOSCOPY N/A 11/20/2014   Procedure: COLONOSCOPY;  Surgeon: Robert Bellow, MD;  Location: [facility] ENDOSCOPY;  Service: Endoscopy;  Laterality: N/A;   CYSTOCELE REPAIR N/A 05/20/2020   Procedure: ANTERIOR COLPORRHAPHY;  Surgeon: Gae Dry, MD;  Location: [facility] ORS;  Service: Gynecology;  Laterality: N/A;   HEMORRHOID SURGERY N/A 11/20/2014   Procedure: HEMORRHOIDECTOMY;  Surgeon: Robert Bellow, MD;  Location: [facility] ORS;  Service: General;  Laterality: N/A;   VAGINAL HYSTERECTOMY N/A 05/20/2020   Procedure: HYSTERECTOMY VAGINAL;  Surgeon: Gae Dry, MD;  Location: [facility] ORS;  Service: Gynecology;  Laterality: N/A;    No Known Allergies   Physical Exam: General: The patient is alert and oriented x3 in no acute distress.  Dermatology: Skin is warm, dry and supple bilateral lower extremities. Negative for open lesions or macerations. Hyperkeratotic dystrophic nails noted to the bilateral great toes consistent with onychomycosis of the toenails.  Vascular:  Palpable pedal pulses bilaterally. Capillary refill within normal limits.  Negative for any significant edema or erythema  Neurological: Light touch and protective threshold grossly intact  Musculoskeletal Exam: No pedal deformities noted. Pain on palpation to the lateral aspect of the ankle along the peroneal tendons extending distally to the insertion of the 5th metatarsal tubercle.   Radiographic Exam:  Normal osseous mineralization. Joint spaces preserved. No fracture/dislocation/boney destruction.    Assessment: 1. Insertional peroneal tendinitis right 2. Onychomycosis of toenails bilateral great toes  Plan of Care:  1. Patient evaluated. X-Rays reviewed.  2. Injection of 0.109m Celestone soluspan administered focused around the fifth metatarsal tubercle of the right foot at the insertion of the peroneal tendon 3. Prescription for a medrol dosepak 4. Prescription for meloxicam 189mdaily after completion of the dosepak 5. CAM boot dispensed. WB as tolerated 6. Hand[employer] parking placard form completed at checkout 7. Tolcylen antifungal topical provided at checkout 8. RTC 4 weeks  *works for [employer]    BrEdrick KinsDPM [facility]  Dr. BrEdrick KinsDPM    [address] [id number]              Office [phone] [phone]

## 2021-08-05 ENCOUNTER — Ambulatory Visit: Payer: 59 | Admitting: Podiatry

## 2021-08-05 ENCOUNTER — Other Ambulatory Visit: Payer: Self-pay

## 2021-08-05 DIAGNOSIS — M7671 Peroneal tendinitis, right leg: Secondary | ICD-10-CM

## 2021-08-05 NOTE — Progress Notes (Signed)
? ?HPI: 61 y.o. female presenting today for follow-up evaluation of insertional peroneal tendinitis to the right foot that is been present for about 2-3 years now intermittently.  Patient states that she feels significantly better.  She says the immobilization cam boot helped significantly as well as the oral and injection anti-inflammatories.  She presents for follow-up treatment and evaluation ? ?Past Medical History:  ?Diagnosis Date  ? Eczema   ? Family history of ovarian cancer 10/18/2014  ? MyRisk neg; sister  ? Genetic testing of female 09/2014  ? 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 Hemorrhoids 2016  ? REM  by DR. BYRNETT c colonoscopy  ? History of mammogram 10/18/14; 11/05/15  ? scattered fibroglandular densities, saw surgeon; incomplete-add views - stable;  ? History of Papanicolaou smear of cervix 10/18/2014  ? -/-  ? Hypertension   ? Psoriasis   ? Shingles 09/2015  ? ? ?Past Surgical History:  ?Procedure Laterality Date  ? BREAST BIOPSY Left 10/2014  ? negative core  ? COLONOSCOPY N/A 11/20/2014  ? Procedure: COLONOSCOPY;  Surgeon: Robert Bellow, MD;  Location: [facility] ENDOSCOPY;  Service: Endoscopy;  Laterality: N/A;  ? CYSTOCELE REPAIR N/A 05/20/2020  ? Procedure: ANTERIOR COLPORRHAPHY;  Surgeon: Gae Dry, MD;  Location: [facility] ORS;  Service: Gynecology;  Laterality: N/A;  ? HEMORRHOID SURGERY N/A 11/20/2014  ? Procedure: HEMORRHOIDECTOMY;  Surgeon: Robert Bellow, MD;  Location: [facility] ORS;  Service: General;  Laterality: N/A;  ? VAGINAL HYSTERECTOMY N/A 05/20/2020  ? Procedure: HYSTERECTOMY VAGINAL;  Surgeon: Gae Dry, MD;  Location: [facility] ORS;  Service: Gynecology;  Laterality: N/A;  ? ? ?No Known Allergies ?  ?Physical Exam: ?General: The patient is alert and oriented x3 in no acute distress. ? ?Dermatology: Skin is warm, dry and supple bilateral lower extremities. Negative for open lesions or macerations. Hyperkeratotic dystrophic nails noted to the bilateral great toes consistent with  onychomycosis of the toenails. ? ?Vascular: Palpable pedal pulses bilaterally. Capillary refill within normal limits.  Negative for any significant edema or erythema ? ?Neurological: Light touch and protective threshold grossly intact ? ?Musculoskeletal Exam: No pedal deformities noted.  Minimal tenderness on palpation to the lateral aspect of the ankle along the peroneal tendons extending distally to the insertion of the 5th metatarsal tubercle.  ? ?Assessment: ?1. Insertional peroneal tendinitis right ?2. Onychomycosis of toenails bilateral great toes ? ?Plan of Care:  ?1. Patient evaluated. ?2.  Overall the patient is doing significantly better.  She says that the immobilization cam boot as well as the oral anti-inflammatories helped significantly. ?3.  Patient may now transition slowly out of the cam boot into good supportive shoes and sneakers over the next 2 weeks ?4.  Continue meloxicam daily as needed.  Patient may also slowly taper off of the meloxicam ?5.  Recommend good supportive shoes and sneakers that do not irritate the lateral aspect of the foot ?6.  Return to clinic as needed ? ?*works for [employer]  ?  ?Edrick Kins, DPM ?[place] ? ?Dr. Edrick Kins, DPM  ?  ?[address]                                        ?[place], [place] [address]                ?Office [phone]  ?  Fax [phone] ? ? ? ? ?

## 2021-11-25 ENCOUNTER — Ambulatory Visit: Payer: 59 | Admitting: Obstetrics & Gynecology

## 2022-01-07 ENCOUNTER — Encounter: Payer: Self-pay | Admitting: Obstetrics and Gynecology

## 2022-01-07 ENCOUNTER — Ambulatory Visit (INDEPENDENT_AMBULATORY_CARE_PROVIDER_SITE_OTHER): Payer: 59 | Admitting: Obstetrics and Gynecology

## 2022-01-07 VITALS — BP 122/80 | Ht 65.0 in | Wt 205.0 lb

## 2022-01-07 DIAGNOSIS — R928 Other abnormal and inconclusive findings on diagnostic imaging of breast: Secondary | ICD-10-CM

## 2022-01-07 DIAGNOSIS — Z131 Encounter for screening for diabetes mellitus: Secondary | ICD-10-CM

## 2022-01-07 DIAGNOSIS — Z13220 Encounter for screening for lipoid disorders: Secondary | ICD-10-CM

## 2022-01-07 DIAGNOSIS — Z1211 Encounter for screening for malignant neoplasm of colon: Secondary | ICD-10-CM | POA: Diagnosis not present

## 2022-01-07 DIAGNOSIS — Z1231 Encounter for screening mammogram for malignant neoplasm of breast: Secondary | ICD-10-CM | POA: Diagnosis not present

## 2022-01-07 DIAGNOSIS — Z01419 Encounter for gynecological examination (general) (routine) without abnormal findings: Secondary | ICD-10-CM | POA: Diagnosis not present

## 2022-01-07 DIAGNOSIS — I10 Essential (primary) hypertension: Secondary | ICD-10-CM

## 2022-01-07 DIAGNOSIS — Z0000 Encounter for general adult medical examination without abnormal findings: Secondary | ICD-10-CM

## 2022-01-07 NOTE — Progress Notes (Signed)
Chief Complaint  Patient presents with   Annual Exam     HPI:      [name] is a 61 y.o. [id number] who LMP was No LMP recorded. (Menstrual status: Irregular Periods)., presents today for her annual examination.  Her menses are absent due to menopause initially/then hyst 12/21. No PMB,  S/p vaginal hysterectomy and anterior colporrhaphy for pelvic relaxation/uterine prolapse with Dr. Tiburcio Pea 1/22. Doing well.   Sex activity: sexually active. No pain/bleeding.  Last Pap: 12/19/17 Results were: no abnormalities /neg HPV DNA.  Hx of STDs: none   Last mammogram: 03/10/20  Results were: normal/repeat dx mammo and u/s in 1 yr; followed for cat 3 for benign RT breast mass. There is a FH of breast cancer in her pat cousin. There is a FH of ovarian cancer in her sister and colon cancer in her brother. 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 The patient does do self-breast exams.   Colonoscopy: colonoscopy 2016 without abnormalities with Dr. Lemar Livings. She is due for repeat after 5 yrs due to FH of polyps/FH colon cancer in her brother. Needs GI ref, lives in [place]   Tobacco use: The patient denies current or previous tobacco use. Alcohol use: none Drug use: none Exercise: not active    She does get adequate calcium and Vitamin D in her diet.   We are managing her HTN. Was on losartan/HCTZ 50-12.5 mg but Rx ran out a few wks ago. BP has been normal off meds.  No side effects with losartan. ACEI cough resolved. Normal BMP 9/20 at work.    No recent labs, doesn't have PCP any more.      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 BYRNETT c colonoscopy   History of mammogram 10/18/14; 11/05/15   scattered fibroglandular densities, saw surgeon; incomplete-add views - stable;   History of Papanicolaou smear of  cervix 10/18/2014   -/-   Hypertension    Psoriasis    Shingles 09/2015    Past Surgical History:  Procedure Laterality Date   BREAST BIOPSY Left 10/2014   negative core   COLONOSCOPY N/A 11/20/2014   Procedure: COLONOSCOPY;  Surgeon: Earline Mayotte, MD;  Location: [facility];  Service: Endoscopy;  Laterality: N/A;   CYSTOCELE REPAIR N/A 05/20/2020   Procedure: ANTERIOR COLPORRHAPHY;  Surgeon: Nadara Mustard, MD;  Location: [facility] ORS;  Service: Gynecology;  Laterality: N/A;   HEMORRHOID SURGERY N/A 11/20/2014   Procedure: HEMORRHOIDECTOMY;  Surgeon: Earline Mayotte, MD;  Location: [facility] ORS;  Service: General;  Laterality: N/A;   VAGINAL HYSTERECTOMY N/A 05/20/2020   Procedure: HYSTERECTOMY VAGINAL;  Surgeon: Nadara Mustard, MD;  Location: [facility] ORS;  Service: Gynecology;  Laterality: N/A;    Family History  Problem Relation Age of Onset   Hypertension Mother 12   Colonic polyp Mother    Diabetes Father 65       TYPE 2   Hypertension Father 80   Heart attack Father    Cancer Father 16       SKIN CA IN SITU   Cancer Sister 42       ovarian, CERVICAL   Heart attack Brother    Hypertension Brother    Colon cancer Brother  50   Breast cancer Neg Hx     Social History   Socioeconomic History   Marital status: Widowed    Spouse name: Not on file   Number of children: 2   Years of education: 14   Highest education level: Not on file  Occupational History   Occupation: LEGAL SECRETARY  Tobacco Use   Smoking status: Never   Smokeless tobacco: Never  Vaping Use   Vaping Use: Never used  Substance and Sexual Activity   Alcohol use: No   Drug use: No   Sexual activity: Yes    Birth control/protection: None  Other Topics Concern   Not on file  Social History Narrative   Not on file   Social Determinants of Health   Financial Resource Strain: Not on file  Food Insecurity: Not on file  Transportation Needs: Not on file  Physical Activity: Unknown  (12/19/2017)   Exercise Vital Sign    Days of Exercise per Week: 0 days    Minutes of Exercise per Session: Not on file  Stress: Not on file  Social Connections: Not on file  Intimate Partner Violence: Not on file    Current Outpatient Medications on File Prior to Visit  Medication Sig Dispense Refill   meloxicam (MOBIC) 15 MG tablet Take 1 tablet (15 mg total) by mouth daily. 30 tablet 1   Multiple Minerals-Vitamins (NUTRA-SUPPORT BONE PO) Take 1 tablet by mouth daily.     Multiple Vitamin (MULTIVITAMIN) capsule Take 1 capsule by mouth daily.     Omega-3 Fatty Acids (FISH OIL PO) Take 1 capsule by mouth daily.     betamethasone dipropionate 0.05 % cream Apply topically.     losartan-hydrochlorothiazide (HYZAAR) 50-12.5 MG tablet TAKE 1 TABLET BY MOUTH  DAILY (Patient not taking: Reported on 01/07/2022) 90 tablet 0   methylPREDNISolone (MEDROL DOSEPAK) 4 MG TBPK tablet 6 day dose pack - take as directed (Patient not taking: Reported on 01/07/2022) 21 tablet 0   oxyCODONE-acetaminophen (PERCOCET/ROXICET) 5-325 MG tablet Take 1 tablet by mouth every 4 (four) hours as needed for moderate pain. (Patient not taking: Reported on 06/03/2020) 30 tablet 0   Turmeric (QC TUMERIC COMPLEX PO) Take by mouth. (Patient not taking: Reported on 01/07/2022)     No current facility-administered medications on file prior to visit.    ROS:  Review of Systems  Constitutional:  Negative for fatigue, fever and unexpected weight change.  Respiratory:  Negative for cough, shortness of breath and wheezing.   Cardiovascular:  Negative for chest pain, palpitations and leg swelling.  Gastrointestinal:  Negative for blood in stool, constipation, diarrhea, nausea and vomiting.  Endocrine: Negative for cold intolerance, heat intolerance and polyuria.  Genitourinary:  Negative for dyspareunia, dysuria, flank pain, frequency, genital sores, hematuria, menstrual problem, pelvic pain, urgency, vaginal bleeding, vaginal  discharge and vaginal pain.  Musculoskeletal:  Negative for back pain, joint swelling and myalgias.  Skin:  Negative for rash.  Neurological:  Negative for dizziness, syncope, light-headedness, numbness and headaches.  Hematological:  Negative for adenopathy.  Psychiatric/Behavioral:  Negative for agitation, confusion, sleep disturbance and suicidal ideas. The patient is not nervous/anxious.      Objective: BP 122/80   Ht $R'5\' 5"'Rz$  (1.651 m)   Wt 205 lb (93 kg)   BMI 34.11 kg/m    Physical Exam Constitutional:      Appearance: She is well-developed.  Genitourinary:     Vulva normal.     Genitourinary Comments: UTERUS/CX SURG REM  Right Labia: No rash, tenderness or lesions.    Left Labia: No tenderness, lesions or rash.    Vaginal cuff intact.    No vaginal discharge, erythema or tenderness.      Right Adnexa: not tender and no mass present.    Left Adnexa: not tender and no mass present.    Cervix is absent.     Uterus is absent.  Breasts:    Right: No mass, nipple discharge, skin change or tenderness.     Left: No mass, nipple discharge, skin change or tenderness.  Neck:     Thyroid: No thyromegaly.  Cardiovascular:     Rate and Rhythm: Normal rate and regular rhythm.     Heart sounds: Normal heart sounds. No murmur heard. Pulmonary:     Effort: Pulmonary effort is normal.     Breath sounds: Normal breath sounds.  Abdominal:     Palpations: Abdomen is soft.     Tenderness: There is no abdominal tenderness. There is no guarding.  Musculoskeletal:        General: Normal range of motion.     Cervical back: Normal range of motion.  Neurological:     General: No focal deficit present.     Mental Status: She is alert and oriented to person, place, and time.     Cranial Nerves: No cranial nerve deficit.  Skin:    General: Skin is warm and dry.  Psychiatric:        Mood and Affect: Mood normal.        Behavior: Behavior normal.        Thought Content: Thought  content normal.        Judgment: Judgment normal.  Vitals reviewed.     Assessment/Plan: Encounter for annual routine gynecological examination  Encounter for screening mammogram for malignant neoplasm of breast - Plan: MM DIAG BREAST TOMO BILATERAL, US BREAST LTD UNI RIGHT INC AXILLA; pt to schedule mammo  Abnormal mammogram of right breast - Plan: MM DIAG BREAST TOMO BILATERAL, US BREAST LTD UNI RIGHT INC AXILLA  Screening for colon cancer - Plan: Ambulatory referral to Gastroenterology; refer to GI  Essential hypertension--off BP meds with normal BP today. Pt to keep journal and f/u if >130/90. Check labs.   Blood tests for routine general physical examination - Plan: Comprehensive metabolic panel, Lipid panel, Hemoglobin A1c, Hemoglobin A1c, Lipid panel, Comprehensive metabolic panel  Screening cholesterol level - Plan: Lipid panel, Lipid panel  Screening for diabetes mellitus - Plan: Hemoglobin A1c, Hemoglobin A1c    GYN counsel breast self exam, mammography screening, menopause, adequate intake of calcium and vitamin D, diet and exercise     F/U  Return in about 1 year (around 01/08/2023).  Emili Mcloughlin B. Airi Copado, PA-C 01/07/2022 5:20 PM

## 2022-01-07 NOTE — Patient Instructions (Addendum)
I value your feedback and you entrusting us with your care. If you get a [facility] patient survey, I would appreciate you taking the time to let us know about your experience today. Thank you!  [facility]: [phone]      

## 2022-02-17 ENCOUNTER — Ambulatory Visit
Admission: RE | Admit: 2022-02-17 | Discharge: 2022-02-17 | Disposition: A | Discharge status: Home / Self Care | Payer: 59 | Source: Ambulatory Visit | Attending: Obstetrics and Gynecology | Admitting: Obstetrics and Gynecology

## 2022-02-17 ENCOUNTER — Encounter: Payer: Self-pay | Admitting: Radiology

## 2022-02-17 DIAGNOSIS — Z1231 Encounter for screening mammogram for malignant neoplasm of breast: Secondary | ICD-10-CM | POA: Insufficient documentation

## 2022-02-17 DIAGNOSIS — R928 Other abnormal and inconclusive findings on diagnostic imaging of breast: Secondary | ICD-10-CM | POA: Insufficient documentation

## 2022-02-19 ENCOUNTER — Encounter: Payer: Self-pay | Admitting: Obstetrics and Gynecology

## 2022-03-12 ENCOUNTER — Telehealth: Payer: Self-pay

## 2022-03-12 NOTE — Telephone Encounter (Signed)
Pt called triage and wants [facility] to know she has been checking her BP at home and it has been higher than 130/90, the highest was about 140/95. She wants to know if [facility] will call in BP Rx.

## 2022-03-14 ENCOUNTER — Other Ambulatory Visit: Payer: Self-pay | Admitting: Obstetrics and Gynecology

## 2022-03-14 DIAGNOSIS — I10 Essential (primary) hypertension: Secondary | ICD-10-CM

## 2022-03-14 MED ORDER — LOSARTAN POTASSIUM-HCTZ 50-12.5 MG PO TABS: 1.0000 | ORAL_TABLET | Freq: Every day | ORAL | 0 refills | Status: DC

## 2022-03-14 NOTE — Telephone Encounter (Signed)
Rx RF eRxd of previous BP med. Pt to f/u for BP check (office visit) in 4 wks, keep taking BP at home.

## 2022-03-14 NOTE — Progress Notes (Signed)
Rx RF losartan-HCTZ for HTN. Had tried coming off meds but BP increased again off meds. Was stable on previous Rx dose.

## 2022-03-15 NOTE — Telephone Encounter (Signed)
Pt aware, will call back to schedule f/u appt.

## 2022-03-15 NOTE — Telephone Encounter (Signed)
Called pt, no answer, LVMTRC. 

## 2022-05-02 ENCOUNTER — Encounter: Payer: Self-pay | Admitting: Physician Assistant

## 2022-05-02 ENCOUNTER — Telehealth: Payer: 59 | Admitting: Physician Assistant

## 2022-05-02 DIAGNOSIS — R051 Acute cough: Secondary | ICD-10-CM

## 2022-05-02 MED ORDER — PREDNISONE 20 MG PO TABS: 40.0000 mg | ORAL_TABLET | Freq: Every day | ORAL | 0 refills | Status: DC

## 2022-05-02 MED ORDER — AMOXICILLIN-POT CLAVULANATE 875-125 MG PO TABS: 1.0000 | ORAL_TABLET | Freq: Two times a day (BID) | ORAL | 0 refills | Status: DC

## 2022-05-02 MED ORDER — AZITHROMYCIN 250 MG PO TABS: ORAL_TABLET | ORAL | 0 refills | Status: AC

## 2022-05-02 NOTE — Progress Notes (Signed)
Virtual Visit Consent   Amber Henson, you are scheduled for a virtual visit with a [facility] provider today. Just as with appointments in the office, your consent must be obtained to participate. Your consent will be active for this visit and any virtual visit you may have with one of our providers in the next 365 days. If you have a MyChart account, a copy of this consent can be sent to you electronically.  As this is a virtual visit, video technology does not allow for your provider to perform a traditional examination. This may limit your provider's ability to fully assess your condition. If your provider identifies any concerns that need to be evaluated in person or the need to arrange testing (such as labs, EKG, etc.), we will make arrangements to do so. Although advances in technology are sophisticated, we cannot ensure that it will always work on either your end or our end. If the connection with a video visit is poor, the visit may have to be switched to a telephone visit. With either a video or telephone visit, we are not always able to ensure that we have a secure connection.  By engaging in this virtual visit, you consent to the provision of healthcare and authorize for your insurance to be billed (if applicable) for the services provided during this visit. Depending on your insurance coverage, you may receive a charge related to this service.  I need to obtain your verbal consent now. Are you willing to proceed with your visit today? Amber Henson has provided verbal consent on 05/02/2022 for a virtual visit (video or telephone). Inda Coke, [place]  Date: 05/02/2022 11:04 AM  Virtual Visit via Video Note   I, Inda Coke, connected with  Amber Henson  ([id number], [id number]) on 05/02/22 at 11:00 AM EST by a video-enabled telemedicine application and verified that I am speaking with the correct person using two identifiers.  Location: Patient: Virtual Visit Location  Patient: Home Provider: Virtual Visit Location Provider: Home Office   I discussed the limitations of evaluation and management by telemedicine and the availability of in person appointments. The patient expressed understanding and agreed to proceed.    History of Present Illness: Amber Henson is a 61 y.o. who identifies as a female who was assigned female at birth, and is being seen today for URI.  Cough, chills, runny nose, fever (100.4 Wednesday), fatigue started Monday of last week Tried to go to work but was too weak and fatigued When lays down, has significant congestion Poor appetite   Did not do a  COVID test Works and goes to church, unclear where she got this from  Denies: chest pain, LE swelling, chest tightness, lethargy, SOB, neck stiffness  HPI: HPI  Problems:  Patient Active Problem List   Diagnosis Date Noted   S/P vaginal hysterectomy 06/03/2020   Cystocele, midline 02/14/2020   Obesity 09/05/2019   Elevated blood pressure reading 09/05/2019   Uterine prolapse 03/09/2019   Sprain of ankle 05/25/2018   Strain of knee 05/25/2018   Osteoarthritis of knee 04/12/2018   Essential hypertension 06/09/2017   Pelvic floor instability 06/09/2017   Conjunctivitis of left eye 11/01/2016   Vitamin D deficiency 03/28/2016   Hypertriglyceridemia 03/28/2016   Hypertension 01/10/2016   Mass of right breast on mammogram 01/10/2016   Diffuse cystic mastopathy of both breasts (R>L) 01/10/2016   Family history of diabetes mellitus-father 01/10/2016   Family history of ovarian cancer- sister age 59  01/10/2016   Family history of essential hypertension- in 14s and early 56s 01/10/2016   Family history of early CAD 01/05/2016   Overweight (BMI 25.0-29.9) 01/05/2016   History of shingles 01/05/2016   h/o Hemorrhoid- sx repair 6/16 11/01/2014    Allergies: No Known Allergies Medications:  Current Outpatient Medications:    amoxicillin-clavulanate (AUGMENTIN) 875-125 MG  tablet, Take 1 tablet by mouth 2 (two) times daily., Disp: 14 tablet, Rfl: 0   azithromycin (ZITHROMAX) 250 MG tablet, Take 2 tablets on day 1, then 1 tablet daily on days 2 through 5, Disp: 6 tablet, Rfl: 0   predniSONE (DELTASONE) 20 MG tablet, Take 2 tablets (40 mg total) by mouth daily., Disp: 10 tablet, Rfl: 0   betamethasone dipropionate 0.05 % cream, Apply topically., Disp: , Rfl:    losartan-hydrochlorothiazide (HYZAAR) 50-12.5 MG tablet, Take 1 tablet by mouth daily., Disp: 90 tablet, Rfl: 0   meloxicam (MOBIC) 15 MG tablet, Take 1 tablet (15 mg total) by mouth daily., Disp: 30 tablet, Rfl: 1   Multiple Minerals-Vitamins (NUTRA-SUPPORT BONE PO), Take 1 tablet by mouth daily., Disp: , Rfl:    Multiple Vitamin (MULTIVITAMIN) capsule, Take 1 capsule by mouth daily., Disp: , Rfl:    Omega-3 Fatty Acids (FISH OIL PO), Take 1 capsule by mouth daily., Disp: , Rfl:    oxyCODONE-acetaminophen (PERCOCET/ROXICET) 5-325 MG tablet, Take 1 tablet by mouth every 4 (four) hours as needed for moderate pain. (Patient not taking: Reported on 06/03/2020), Disp: 30 tablet, Rfl: 0   Turmeric (QC TUMERIC COMPLEX PO), Take by mouth. (Patient not taking: Reported on 01/07/2022), Disp: , Rfl:   Observations/Objective: Patient is well-developed, well-nourished in no acute distress.  Resting comfortably  at home.  Head is normocephalic, atraumatic.  No labored breathing.  Speech is clear and coherent with logical content.  Patient is alert and oriented at baseline.   Assessment and Plan: 1. Acute cough Suspect possible PNA Start oral augmentin and azithromycin, oral prednisone also sent Recommend follow-up in office with PCP in 1 week for lung evaluation, sooner if concerns Push fluids and rest Discussed low threshold to go to ER or UC if new sx  Follow Up Instructions: I discussed the assessment and treatment plan with the patient. The patient was provided an opportunity to ask questions and all were  answered. The patient agreed with the plan and demonstrated an understanding of the instructions.  A copy of instructions were sent to the patient via MyChart unless otherwise noted below.    The patient was advised to call back or seek an in-person evaluation if the symptoms worsen or if the condition fails to improve as anticipated.  Time:  I spent 5-10 minutes with the patient via telehealth technology discussing the above problems/concerns.    Inda Coke, [place]

## 2022-05-03 ENCOUNTER — Encounter: Payer: Self-pay | Admitting: Family Medicine

## 2022-05-03 ENCOUNTER — Encounter: Payer: Self-pay | Admitting: Physician Assistant

## 2022-05-03 ENCOUNTER — Ambulatory Visit
Admission: EM | Admit: 2022-05-03 | Discharge: 2022-05-03 | Disposition: A | Discharge status: Home / Self Care | Payer: 59 | Attending: Emergency Medicine | Admitting: Emergency Medicine

## 2022-05-03 DIAGNOSIS — J189 Pneumonia, unspecified organism: Secondary | ICD-10-CM

## 2022-05-03 DIAGNOSIS — H6123 Impacted cerumen, bilateral: Secondary | ICD-10-CM | POA: Diagnosis not present

## 2022-05-03 MED ORDER — ALBUTEROL SULFATE HFA 108 (90 BASE) MCG/ACT IN AERS: 2.0000 | INHALATION_SPRAY | Freq: Four times a day (QID) | RESPIRATORY_TRACT | 2 refills | Status: DC | PRN

## 2022-05-03 MED ORDER — GUAIFENESIN 400 MG PO TABS: ORAL_TABLET | ORAL | 0 refills | Status: DC

## 2022-05-03 MED ORDER — PROMETHAZINE-DM 6.25-15 MG/5ML PO SYRP: 5.0000 mL | ORAL_SOLUTION | Freq: Four times a day (QID) | ORAL | 0 refills | Status: DC | PRN

## 2022-05-03 MED ORDER — AEROCHAMBER PLUS FLO-VU LARGE MISC: 1.0000 | Freq: Once | 0 refills | Status: AC

## 2022-05-03 NOTE — ED Provider Notes (Signed)
[facility]    CSN: [id number] Arrival date & time: 05/03/22  1617    HISTORY   Chief Complaint  Patient presents with   Nasal Congestion    I was diagnosed with Pneumonia on 12/10, but allowed to go back to work, 12/12. My ears are clogged and still weak, and need to get examined to see if I can get a note to go back Wednesday or later. - Entered by patient   Cough   HPI Amber Henson is a pleasant, 61 y.o. female who presents to urgent care today. Patient reports being diagnosed with pneumonia 05/02/2022 via video visit, states she was prescribed Augmentin, azithromycin and prednisone.  Patient states she was also was given a note to return to work on 05/04/2022 but does not feel able to return to work tomorrow.  Patient states she also has not started the prednisone because of how steroids make her feel.  Patient states she continues to have a productive cough, nasal congestion and her left ear feels clogged.  Patient states overall, her symptoms began about a week ago.  Patient is requesting a note to be out of work until 05/05/22.  Patient states at initial onset of symptoms, she was taking Mucinex D which she stopped when she got the prescription for the antibiotics.  Patient states she is never required the use of an albuterol inhaler.  Patient states when she coughs a lot it makes her chest feel heavy and tight.  Patient states he is not sleeping well at night due to cough.  The history is provided by the patient.   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 BYRNETT c colonoscopy   History of mammogram 10/18/14; 11/05/15   scattered fibroglandular densities, saw surgeon; incomplete-add views - stable;   History of Papanicolaou smear of cervix 10/18/2014   -/-   Hypertension    Psoriasis     Shingles 09/2015   Patient Active Problem List   Diagnosis Date Noted   S/P vaginal hysterectomy 06/03/2020   Cystocele, midline 02/14/2020   Obesity 09/05/2019   Elevated blood pressure reading 09/05/2019   Uterine prolapse 03/09/2019   Sprain of ankle 05/25/2018   Strain of knee 05/25/2018   Osteoarthritis of knee 04/12/2018   Essential hypertension 06/09/2017   Pelvic floor instability 06/09/2017   Conjunctivitis of left eye 11/01/2016   Vitamin D deficiency 03/28/2016   Hypertriglyceridemia 03/28/2016   Hypertension 01/10/2016   Mass of right breast on mammogram 01/10/2016   Diffuse cystic mastopathy of both breasts (R>L) 01/10/2016   Family history of diabetes mellitus-father 01/10/2016   Family history of ovarian cancer- sister age 23 01/10/2016   Family history of essential hypertension- in 61s and early 67s 01/10/2016   Family history of early CAD 01/05/2016   Overweight (BMI 25.0-29.9) 01/05/2016   History of shingles 01/05/2016   h/o Hemorrhoid- sx repair 6/16 11/01/2014   Past Surgical History:  Procedure Laterality Date   BREAST BIOPSY Left 10/2014   negative core   COLONOSCOPY N/A 11/20/2014   Procedure: COLONOSCOPY;  Surgeon: Robert Bellow, MD;  Location: [facility];  Service: Endoscopy;  Laterality: N/A;   CYSTOCELE REPAIR N/A 05/20/2020   Procedure: ANTERIOR COLPORRHAPHY;  Surgeon: Gae Dry, MD;  Location: [facility];  Service: Gynecology;  Laterality: N/A;   HEMORRHOID SURGERY N/A 11/20/2014   Procedure: HEMORRHOIDECTOMY;  Surgeon: Robert Bellow, MD;  Location: [facility]S;  Service: General;  Laterality: N/A;   VAGINAL HYSTERECTOMY N/A 05/20/2020   Procedure: HYSTERECTOMY VAGINAL;  Surgeon: Gae Dry, MD;  Location: [facility];  Service: Gynecology;  Laterality: N/A;   OB History     Gravida  2   Para  2   Term  2   Preterm      AB      Living  2      SAB      IAB      Ectopic      Multiple      Live Births  2         Obstetric Comments  1st Menstrual Cycle:  16  1st Pregnancy:  23        Home Medications    Prior to Admission medications   Medication Sig Start Date End Date Taking? Authorizing Provider  amoxicillin-clavulanate (AUGMENTIN) 875-125 MG tablet Take 1 tablet by mouth 2 (two) times daily. 05/02/22   Inda Coke, PA  azithromycin (ZITHROMAX) 250 MG tablet Take 2 tablets on day 1, then 1 tablet daily on days 2 through 5 05/02/22 05/07/22  Inda Coke, PA  betamethasone dipropionate 0.05 % cream Apply topically. 11/16/21   [provider]  losartan-hydrochlorothiazide (HYZAAR) 50-12.5 MG tablet Take 1 tablet by mouth daily. 76/19/50   Copland, Deirdre Evener, PA-C  meloxicam (MOBIC) 15 MG tablet Take 1 tablet (15 mg total) by mouth daily. 07/08/21   Edrick Kins, DPM  Multiple Minerals-Vitamins (NUTRA-SUPPORT BONE PO) Take 1 tablet by mouth daily.    [provider]  Multiple Vitamin (MULTIVITAMIN) capsule Take 1 capsule by mouth daily.    [provider]  Omega-3 Fatty Acids (FISH OIL PO) Take 1 capsule by mouth daily.    [provider]  predniSONE (DELTASONE) 20 MG tablet Take 2 tablets (40 mg total) by mouth daily. 05/02/22   Inda Coke, PA    Family History Family History  Problem Relation Age of Onset   Hypertension Mother 39   Colonic polyp Mother    Diabetes Father 53       TYPE 2   Hypertension Father 48   Heart attack Father    Cancer Father 7       SKIN CA IN SITU   Cancer Sister 70       ovarian, CERVICAL   Heart attack Brother    Hypertension Brother    Colon cancer Brother 55   Breast cancer Neg Hx    Social History Social History   Tobacco Use   Smoking status: Never   Smokeless tobacco: Never  Vaping Use   Vaping Use: Never used  Substance Use Topics   Alcohol use: No   Drug use: No   Allergies   Patient has no known allergies.  Review of Systems Review of Systems Pertinent findings revealed after  performing a 14 point review of systems has been noted in the history of present illness.  Physical Exam Triage Vital Signs ED Triage Vitals  Enc Vitals Group     BP 03/20/21 0827 (!) 147/82     Pulse Rate 03/20/21 0827 72     Resp 03/20/21 0827 18     Temp 03/20/21 0827 98.3 F (36.8 C)     Temp Source 03/20/21 0827 Oral  SpO2 03/20/21 0827 98 %     Weight --      Height --      Head Circumference --      Peak Flow --      Pain Score 03/20/21 0826 5     Pain Loc --      Pain Edu? --      Excl. in [place]? --   No data found.  Updated Vital Signs BP (!) 141/84 (BP Location: Right Arm)   Pulse 93   Temp 98.9 F (37.2 C) (Oral)   Resp 17   LMP 09/01/2019 (Approximate)   SpO2 95%   Physical Exam Vitals and nursing note reviewed.  Constitutional:      General: She is not in acute distress.    Appearance: Normal appearance. She is not ill-appearing.  HENT:     Head: Normocephalic and atraumatic.     Salivary Glands: Right salivary gland is not diffusely enlarged or tender. Left salivary gland is not diffusely enlarged or tender.     Right Ear: External ear normal. Decreased hearing noted. There is impacted cerumen.     Left Ear: External ear normal. Decreased hearing noted. There is impacted cerumen.     Ears:     Comments: Patient states she is hard of hearing    Nose: Nose normal. No nasal deformity, septal deviation, mucosal edema, congestion or rhinorrhea.     Right Turbinates: Not enlarged, swollen or pale.     Left Turbinates: Not enlarged, swollen or pale.     Right Sinus: No maxillary sinus tenderness or frontal sinus tenderness.     Left Sinus: No maxillary sinus tenderness or frontal sinus tenderness.     Mouth/Throat:     Lips: Pink. No lesions.     Mouth: Mucous membranes are moist. No oral lesions.     Pharynx: Oropharynx is clear. Uvula midline. No posterior oropharyngeal erythema or uvula swelling.     Tonsils: No tonsillar exudate. 0 on the right. 0 on  the left.  Eyes:     General: Lids are normal.        Right eye: No discharge.        Left eye: No discharge.     Extraocular Movements: Extraocular movements intact.     Conjunctiva/sclera: Conjunctivae normal.     Right eye: Right conjunctiva is not injected.     Left eye: Left conjunctiva is not injected.  Neck:     Trachea: Trachea and phonation normal.  Cardiovascular:     Rate and Rhythm: Normal rate and regular rhythm.     Pulses: Normal pulses.     Heart sounds: Normal heart sounds. No murmur heard.    No friction rub. No gallop.  Pulmonary:     Effort: Pulmonary effort is normal. No accessory muscle usage, prolonged expiration or respiratory distress.     Breath sounds: Normal breath sounds. No stridor, decreased air movement or transmitted upper airway sounds. No decreased breath sounds, wheezing, rhonchi or rales.  Chest:     Chest wall: No tenderness.  Musculoskeletal:        General: Normal range of motion.     Cervical back: Normal range of motion and neck supple. Normal range of motion.  Lymphadenopathy:     Cervical: No cervical adenopathy.  Skin:    General: Skin is warm and dry.     Findings: No erythema or rash.  Neurological:     General: No focal deficit  present.     Mental Status: She is alert and oriented to person, place, and time.  Psychiatric:        Mood and Affect: Mood normal.        Behavior: Behavior normal.     Visual Acuity Right Eye Distance:   Left Eye Distance:   Bilateral Distance:    Right Eye Near:   Left Eye Near:    Bilateral Near:     UC Couse / Diagnostics / Procedures:     Radiology No results found.  Procedures Ear Cerumen Removal  Date/Time: 05/03/2022 7:02 PM  Performed by: Lynden Oxford Scales, PA-C Authorized by: Lynden Oxford Scales, PA-C   Consent:    Consent obtained:  Verbal   Consent given by:  Patient   Risks, benefits, and alternatives were discussed: yes     Risks discussed:  Bleeding,  dizziness, infection, incomplete removal, pain and TM perforation   Alternatives discussed:  No treatment, delayed treatment, alternative treatment, observation and referral Universal protocol:    Procedure explained and questions answered to patient or proxy's satisfaction: yes     Patient identity confirmed:  Verbally with patient and arm band Procedure details:    Location:  L ear and R ear   Procedure type: irrigation     Procedure outcomes: cerumen removed   Post-procedure details:    Inspection:  No bleeding, ear canal clear and TM intact   Procedure completion:  Tolerated  (including critical care time) EKG  Pending results:  Labs Reviewed - No data to display  Medications Ordered in UC: Medications - No data to display  UC Diagnoses / Final Clinical Impressions(s)   I have reviewed the triage vital signs and the nursing notes.  Pertinent labs & imaging results that were available during my care of the patient were reviewed by me and considered in my medical decision making (see chart for details).    Final diagnoses:  Bilateral impacted cerumen  Community acquired pneumonia, unspecified laterality   Patient advised to complete antibiotics as prescribed.  Patient's ears were cleaned out without difficulty.  Patient advised not to take steroids as prescribed as they are not indicated with pneumonia.  Patient provided with an albuterol inhaler and nighttime cough syrup Please see discharge instructions below for further details of plan of care as provided to patient. ED Prescriptions     Medication Sig Dispense Auth. Provider   albuterol (VENTOLIN HFA) 108 (90 Base) MCG/ACT inhaler Inhale 2 puffs into the lungs every 6 (six) hours as needed for wheezing or shortness of breath (Cough). 18 g Lynden Oxford Scales, PA-C   Spacer/Aero-Holding Chambers (AEROCHAMBER PLUS FLO-VU LARGE) MISC 1 each by Other route once for 1 dose. 1 each Lynden Oxford Scales, PA-C   guaifenesin  (HUMIBID E) 400 MG TABS tablet Take 1 tablet 3 times daily as needed for chest congestion and cough 21 tablet Lynden Oxford Scales, PA-C   promethazine-dextromethorphan (PROMETHAZINE-DM) 6.25-15 MG/5ML syrup Take 5 mLs by mouth 4 (four) times daily as needed for cough. 60 mL Lynden Oxford Scales, PA-C      PDMP not reviewed this encounter.  Disposition Upon Discharge:  Condition: stable for discharge home Home: take medications as prescribed; routine discharge instructions as discussed; follow up as advised.  Patient presented with an acute illness with associated systemic symptoms and significant discomfort requiring urgent management. In my opinion, this is a condition that a prudent lay person (someone who possesses an average knowledge of health  and medicine) may potentially expect to result in complications if not addressed urgently such as respiratory distress, impairment of bodily function or dysfunction of bodily organs.   Routine symptom specific, illness specific and/or disease specific instructions were discussed with the patient and/or caregiver at length.   As such, the patient has been evaluated and assessed, work-up was performed and treatment was provided in alignment with urgent care protocols and evidence based medicine.  Patient/parent/caregiver has been advised that the patient may require follow up for further testing and treatment if the symptoms continue in spite of treatment, as clinically indicated and appropriate.  If the patient was tested for COVID-19, Influenza and/or RSV, then the patient/parent/guardian was advised to isolate at home pending the results of his/her diagnostic coronavirus test and potentially longer if they're positive. I have also advised pt that if his/her COVID-19 test returns positive, it's recommended to self-isolate for at least 10 days after symptoms first appeared AND until fever-free for 24 hours without fever reducer AND other symptoms have  improved or resolved. Discussed self-isolation recommendations as well as instructions for household member/close contacts as per the [facility] and [place] DHHS, and also gave patient the [place] packet with this information.  Patient/parent/caregiver has been advised to return to the [facility] or PCP in 3-5 days if no better; to PCP or the Emergency Department if new signs and symptoms develop, or if the current signs or symptoms continue to change or worsen for further workup, evaluation and treatment as clinically indicated and appropriate  The patient will follow up with their current PCP if and as advised. If the patient does not currently have a PCP we will assist them in obtaining one.   The patient may need specialty follow up if the symptoms continue, in spite of conservative treatment and management, for further workup, evaluation, consultation and treatment as clinically indicated and appropriate.  Patient/parent/caregiver verbalized understanding and agreement of plan as discussed.  All questions were addressed during visit.  Please see discharge instructions below for further details of plan.  Discharge Instructions:   Discharge Instructions      Please continue Augmentin and azithromycin as they have been prescribed for you.  Steroids are not recommended for the treatment of pneumonia so I therefore recommend that you do not take them.  I do recommend that you begin using albuterol, bronchodilator which should significantly improve your work of breathing and help you clear sputum from your lungs more easily.  I also recommend that you resume taking Mucinex without the cough suppressant during the daytime, also to help to clear sputum from your lungs more easily.  I provided you with a nighttime cough syrup called Promethazine DM which should make you a little sleepy, dry up nasal congestion and suppress your cough so you can get some rest.  I have also provided you with a note to return to work on  Wednesday as well as Thursday, please use the note that best suits how you are feeling and your ability to return to work.  If you have worsening cough, fever or simply are not feeling better in the next 3 to 4 days, please feel free to return for repeat evaluation or follow-up with your primary care provider.  Thank you for visiting urgent care today.      This office note has been dictated using Museum/gallery curator.  Unfortunately, this method of dictation can sometimes lead to typographical or grammatical errors.  I apologize for your inconvenience  in advance if this occurs.  Please do not hesitate to reach out to me if clarification is needed.      Lynden Oxford Scales, PA-C 05/06/22 2124

## 2022-05-03 NOTE — ED Notes (Signed)
Rounding on patient No needs at this time. Pt made aware provider will be in shortly.  Interventions:

## 2022-05-03 NOTE — Discharge Instructions (Addendum)
Please continue Augmentin and azithromycin as they have been prescribed for you.  Steroids are not recommended for the treatment of pneumonia so I therefore recommend that you do not take them.  I do recommend that you begin using albuterol, bronchodilator which should significantly improve your work of breathing and help you clear sputum from your lungs more easily.  I also recommend that you resume taking Mucinex without the cough suppressant during the daytime, also to help to clear sputum from your lungs more easily.  I provided you with a nighttime cough syrup called Promethazine DM which should make you a little sleepy, dry up nasal congestion and suppress your cough so you can get some rest.  I have also provided you with a note to return to work on Wednesday as well as Thursday, please use the note that best suits how you are feeling and your ability to return to work.  If you have worsening cough, fever or simply are not feeling better in the next 3 to 4 days, please feel free to return for repeat evaluation or follow-up with your primary care provider.  Thank you for visiting urgent care today.

## 2022-05-03 NOTE — ED Triage Notes (Signed)
Pt reports cough, nasal congestion and left era clogged x 1 week. Reports she was diagnosed with Pneumonia on 12/10. Pt needs a note to go back to work on 05/05/22.

## 2022-05-06 ENCOUNTER — Encounter: Payer: Self-pay | Admitting: *Deleted

## 2022-05-07 ENCOUNTER — Ambulatory Visit: Payer: 59 | Admitting: Family

## 2022-05-07 ENCOUNTER — Encounter: Payer: Self-pay | Admitting: Family

## 2022-05-07 VITALS — BP 129/84 | HR 72 | Temp 96.8°F | Ht 65.0 in | Wt 210.2 lb

## 2022-05-07 DIAGNOSIS — J069 Acute upper respiratory infection, unspecified: Secondary | ICD-10-CM

## 2022-05-07 DIAGNOSIS — H938X2 Other specified disorders of left ear: Secondary | ICD-10-CM | POA: Diagnosis not present

## 2022-05-07 NOTE — Progress Notes (Signed)
Patient ID: Amber Henson, female    DOB: 09/12/1960, 61 y.o.   MRN: [id number]  Chief Complaint  Patient presents with   New Patient (Initial Visit)   Hearing Problem    Pt c/o left ear hearing problem since Monday. Pt states all wax was cleared out of her ears but muffled hearing.     Follow-up    Pt was seen in ED on 12/10 and diagnosed with pneumonia.     HPI: URI sx:  dx on 12/10, had virtual visit & started on Augmentin & Zpack & steroids (has not started steroids) went to UC due to ear fullness, found ears impacted, cleaned out, lungs were wheezy and started on inhaler- pt reports it causes her to be jittery & she is worried about using while working. Reports cough improving, sometimes productive - clear, no fever, sinus sx have improved, but left ear is completely muffled.  Assessment & Plan:  1. Upper respiratory tract infection, unspecified type - finished Zpack today, still taking Augmentin - advised ok to stop after 5d; start prednisone; can do 1 puff of Albuterol in am before work if worried about feeling jittery & then do another puff around lunch time. Try to do 2 puffs 1-2h before bedtime to help breathing overnight, recommend humidifier overnight, increase water intake to 2L qd.  RTO if sx not better in another week.   2. Ear congestion, left - mild effusion noted, reassured pt no infection, can take several weeks to open, may start "popping" first which is normal. Starting prednisone may give some relief, steroid nasal sprays are not proven to help but can try if she chooses, 1 spray each side 1-2x/d.    Subjective:    Outpatient Medications Prior to Visit  Medication Sig Dispense Refill   albuterol (VENTOLIN HFA) 108 (90 Base) MCG/ACT inhaler Inhale 2 puffs into the lungs every 6 (six) hours as needed for wheezing or shortness of breath (Cough). 18 g 2   amoxicillin-clavulanate (AUGMENTIN) 875-125 MG tablet Take 1 tablet by mouth 2 (two) times daily. 14 tablet 0    azithromycin (ZITHROMAX) 250 MG tablet Take 2 tablets on day 1, then 1 tablet daily on days 2 through 5 6 tablet 0   betamethasone dipropionate 0.05 % cream Apply topically.     guaifenesin (HUMIBID E) 400 MG TABS tablet Take 1 tablet 3 times daily as needed for chest congestion and cough 21 tablet 0   losartan-hydrochlorothiazide (HYZAAR) 50-12.5 MG tablet Take 1 tablet by mouth daily. 90 tablet 0   meloxicam (MOBIC) 15 MG tablet Take 1 tablet (15 mg total) by mouth daily. 30 tablet 1   Multiple Minerals-Vitamins (NUTRA-SUPPORT BONE PO) Take 1 tablet by mouth daily.     Multiple Vitamin (MULTIVITAMIN) capsule Take 1 capsule by mouth daily.     Omega-3 Fatty Acids (FISH OIL PO) Take 1 capsule by mouth daily.     promethazine-dextromethorphan (PROMETHAZINE-DM) 6.25-15 MG/5ML syrup Take 5 mLs by mouth 4 (four) times daily as needed for cough. 60 mL 0   predniSONE (DELTASONE) 20 MG tablet Take 2 tablets (40 mg total) by mouth daily. (Patient not taking: Reported on 05/07/2022) 10 tablet 0   No facility-administered medications prior to visit.   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 BYRNETT c colonoscopy   History of mammogram 10/18/14; 11/05/15   scattered fibroglandular densities, saw surgeon; incomplete-add views - stable;   History of Papanicolaou smear of cervix 10/18/2014   -/-   Hypertension    Psoriasis    Shingles 09/2015   Past Surgical History:  Procedure Laterality Date   BREAST BIOPSY Left 10/2014   negative core   COLONOSCOPY N/A 11/20/2014   Procedure: COLONOSCOPY;  Surgeon: Robert Bellow, MD;  Location: [facility] END[facility];  Service: Endoscopy;  Laterality: N/A;   CYSTOCELE REPAIR N/A 05/20/2020   Procedure: ANTERIOR COLPORRHAPHY;  Surgeon: Gae Dry, MD;  Location: [facility] ORS;  Service:  Gynecology;  Laterality: N/A;   HEMORRHOID SURGERY N/A 11/20/2014   Procedure: HEMORRHOIDECTOMY;  Surgeon: Robert Bellow, MD;  Location: [facility] ORS;  Service: General;  Laterality: N/A;   VAGINAL HYSTERECTOMY N/A 05/20/2020   Procedure: HYSTERECTOMY VAGINAL;  Surgeon: Gae Dry, MD;  Location: [facility] ORS;  Service: Gynecology;  Laterality: N/A;   No Known Allergies    Objective:    Physical Exam Vitals and nursing note reviewed.  Constitutional:      Appearance: Normal appearance.  Cardiovascular:     Rate and Rhythm: Normal rate and regular rhythm.  Pulmonary:     Effort: Pulmonary effort is normal.     Breath sounds: Examination of the right-middle field reveals wheezing. Examination of the left-middle field reveals wheezing. Examination of the right-lower field reveals wheezing. Examination of the left-lower field reveals wheezing. Wheezing (mild inspiratory) present.  Musculoskeletal:        General: Normal range of motion.  Skin:    General: Skin is warm and dry.  Neurological:     Mental Status: She is alert.  Psychiatric:        Mood and Affect: Mood normal.        Behavior: Behavior normal.    BP 129/84 (BP Location: Left Arm, Patient Position: Sitting, Cuff Size: Large)   Pulse 72   Temp (!) 96.8 F (36 C) (Temporal)   Ht _0  (1.651 m)   Wt 210 lb 3.2 oz (95.3 kg)   LMP 09/01/2019 (Approximate)   SpO2 98%   BMI 34.98 kg/m  Wt Readings from Last 3 Encounters:  05/07/22 210 lb 3.2 oz (95.3 kg)  01/07/22 205 lb (93 kg)  07/03/20 164 lb (74.4 kg)       Jeanie Sewer, NP

## 2022-05-07 NOTE — Patient Instructions (Signed)
It was very nice to see you today!  As discussed, finish the Augmentin after 5 days. Start the Prednisone and take as directed, if better after 2 days, ok to take just 1 pill for 3 more days or stop taking. This will help your lungs recover and possibly help your left ear congestion. OK to try steroid nasal spray, OTC, generic Flonase or Nasacort, 1 spray each nostril 1-2 times per day for a week. This does take a day or 2 to kick in, so want to use 1 full week at least.  Try 1 puff of the Albuterol inhaler in the am, another puff at lunch and then 1-2 puffs about 1 hour before bedtime to help your breathing overnight. Also recommend using a humidifier overnight.  Drink at least 64oz water every day!  Merry Christmas!       PLEASE NOTE:  If you had any lab tests please let us know if you have not heard back within a few days. You may see your results on MyChart before we have a chance to review them but we will give you a call once they are reviewed by [place]. If we ordered any referrals today, please let us know if you have not heard from their office within the next week.

## 2022-05-23 IMAGING — MG DIGITAL DIAGNOSTIC BILAT W/ TOMO W/ CAD
8 series · 8 of 24 positions shown · non-contrast
Comparison: Previous exam(s).

CLINICAL DATA: 59-year-old female presenting for annual bilateral
mammogram and 1 year follow-up of probably benign right breast
masses.

EXAM:
DIGITAL DIAGNOSTIC BILATERAL MAMMOGRAM WITH CAD AND TOMO
ULTRASOUND RIGHT BREAST

[L MLO synth-2D]
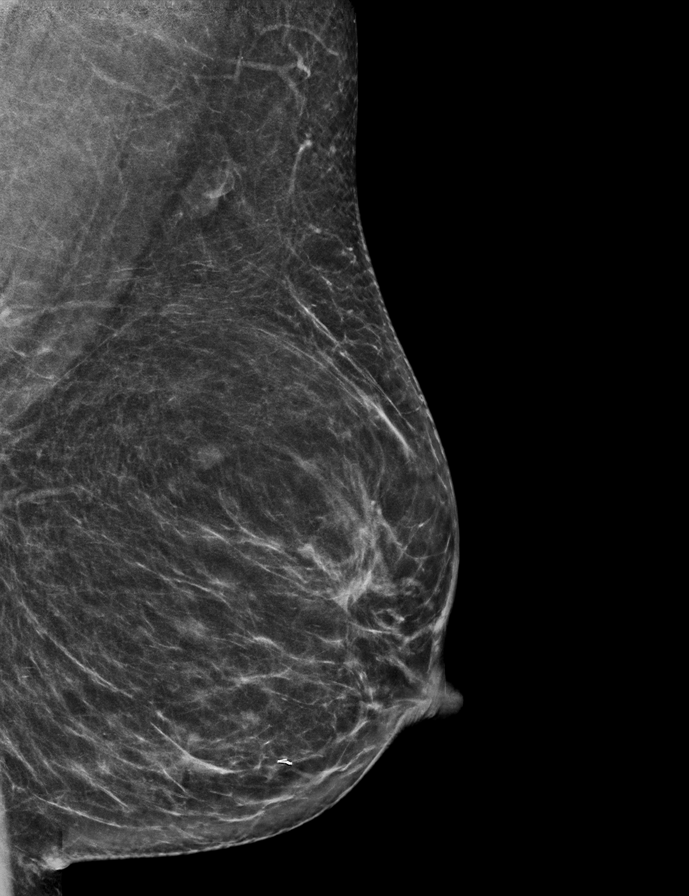

[R MLO synth-2D]
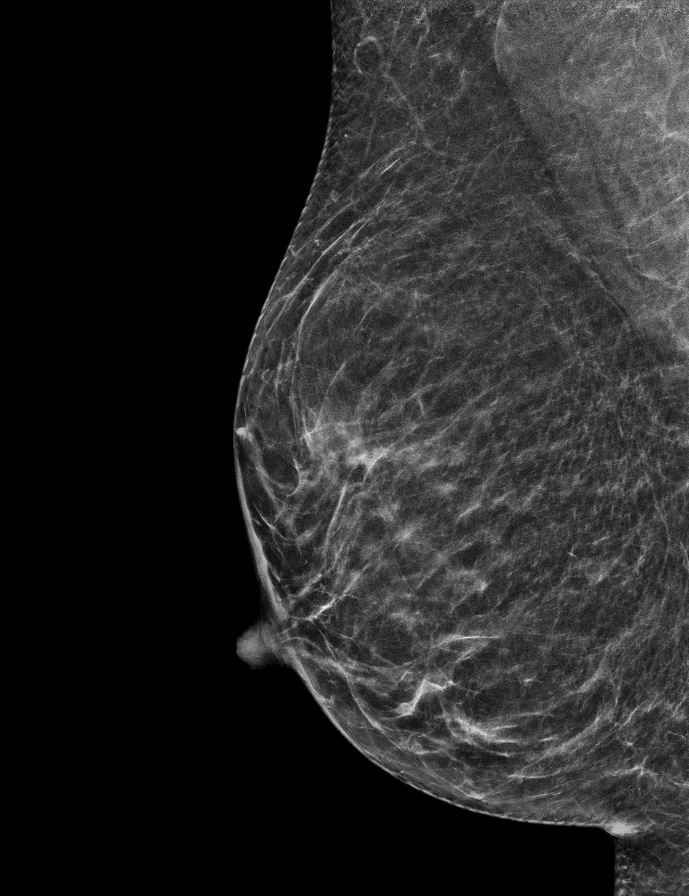

[L CC synth-2D]
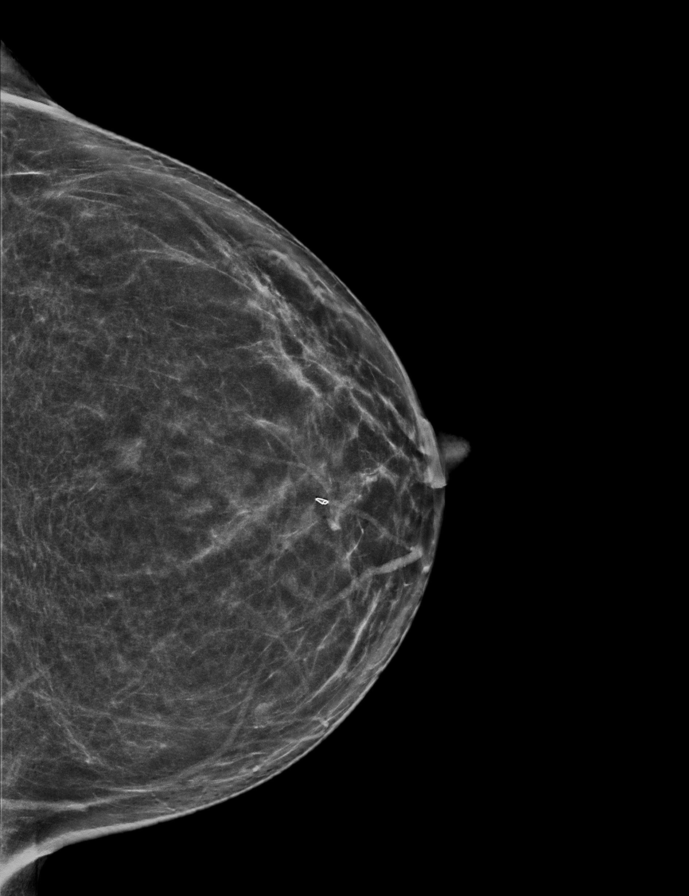

[R CC synth-2D]
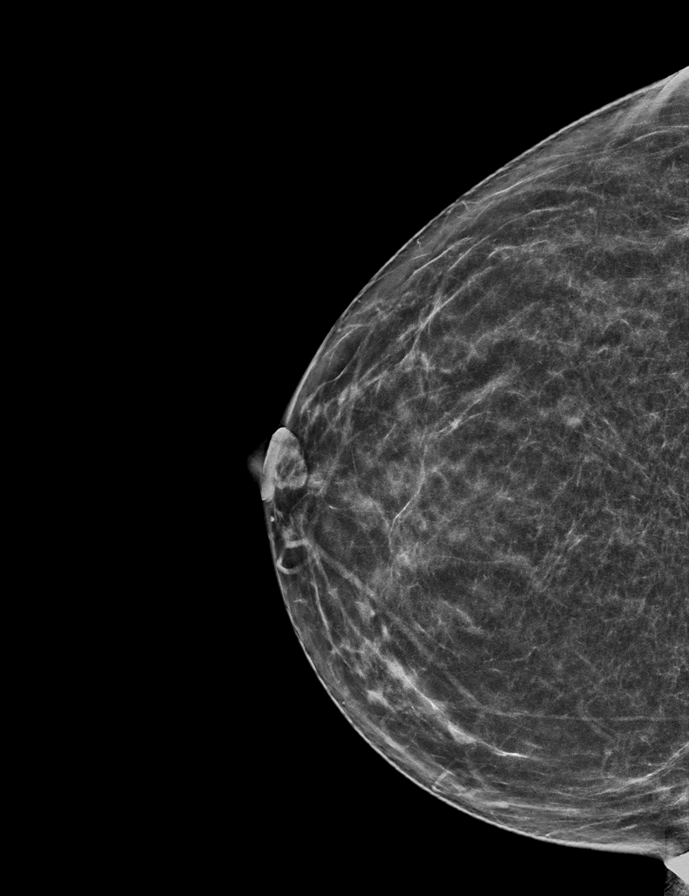

[L CC tomo · tomo slice 31/61.0]
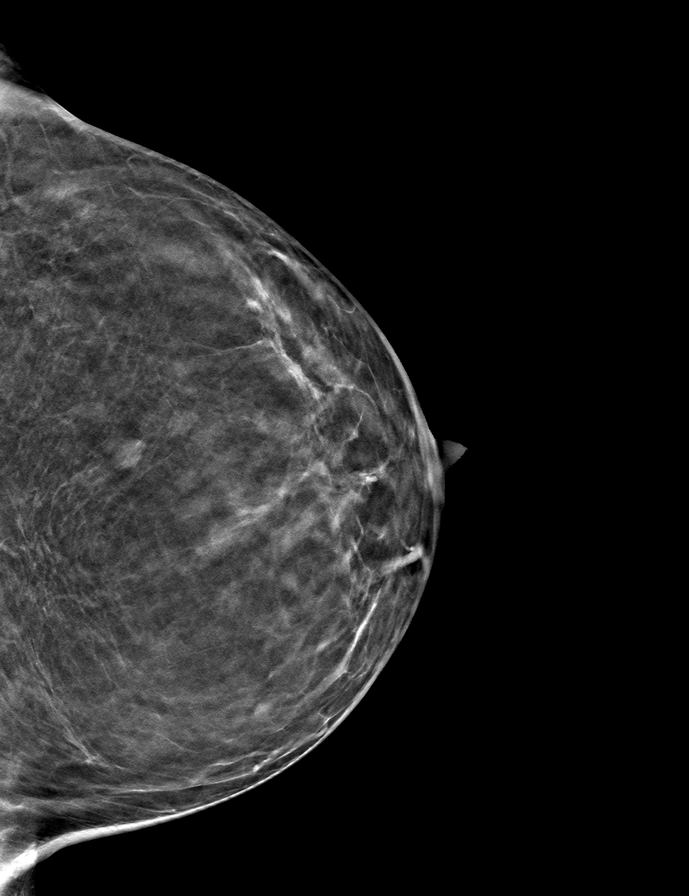

[L MLO tomo · tomo slice 33/65.0]
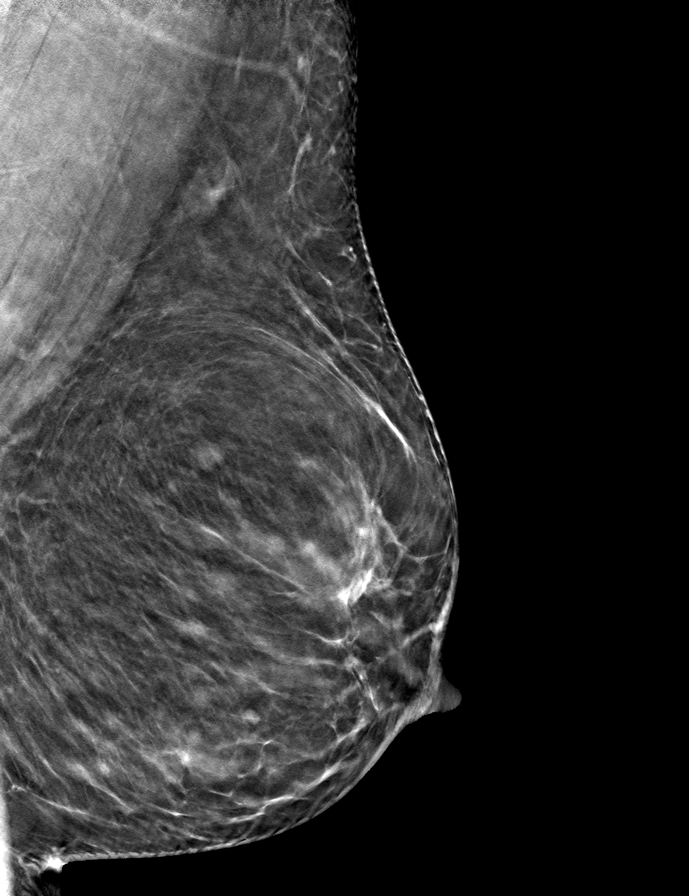

[R MLO tomo · tomo slice 30/59.0]
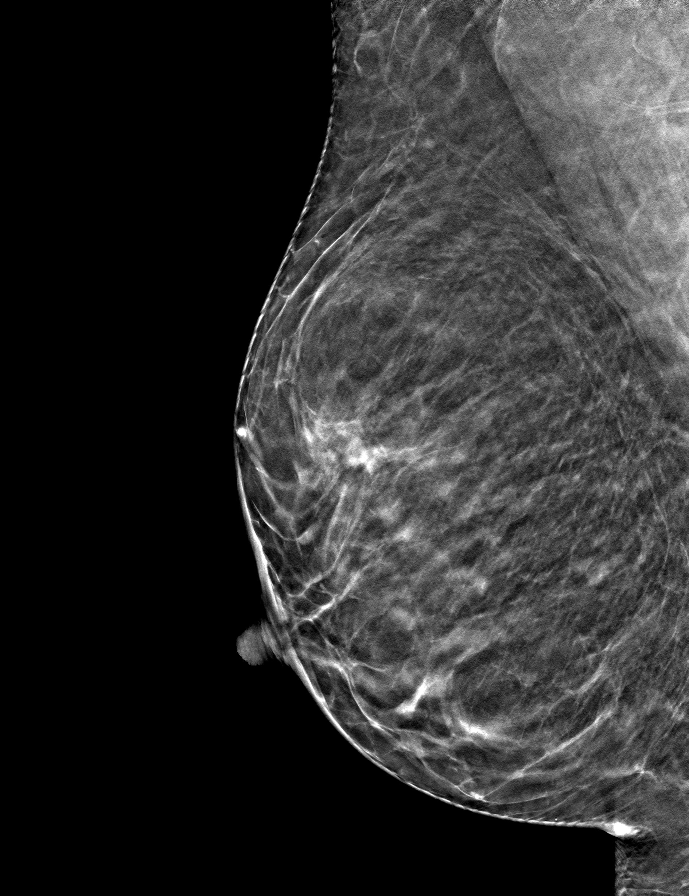

[R CC tomo · tomo slice 29/58.0]
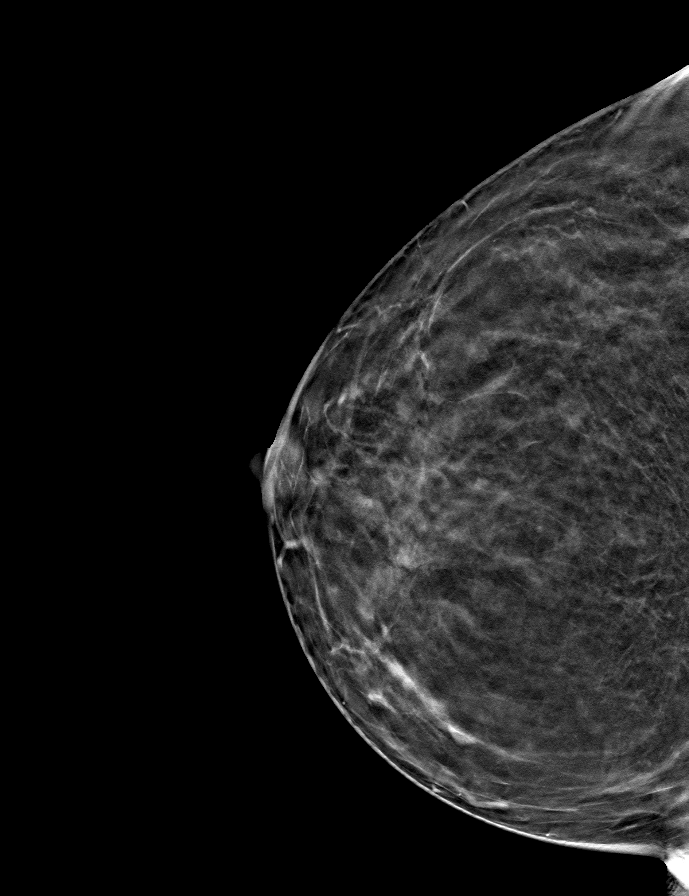

[8 of 24 positions shown; findings below may reference images not displayed]

ACR Breast Density Category b: There are scattered areas of
fibroglandular density.
FINDINGS: Masses in the lower and outer right breast are mammographically
stable. No new or suspicious mammographic findings are identified in
either breast.

Mammographic images were processed with CAD.

Targeted ultrasound is performed, showing interval resolution of the
previously identified mass at the 8 o'clock position 5 cm from the
nipple. No residual focal findings are identified. A mass at the 10
o'clock position 5 cm from the nipple measures 5 x 4 x 2 mm
(previously 6 x 4 x 3 mm). An additional mass at the 9 o'clock
position 4 cm from the nipple measures 8 x 6 x 3 mm (previously 8 x
7 x 3 mm)
IMPRESSION: 1. Interval resolution of the sonographically identified mass at the
8 o'clock position of the right breast. Additional mammographically
and sonographically identified masses are stable. Continued imaging
follow-up recommended.
2. No mammographic evidence of malignancy on the left.

RECOMMENDATION:
Bilateral diagnostic mammogram and right breast ultrasound in 1
year.

I have discussed the findings and recommendations with the patient.
If applicable, a reminder letter will be sent to the patient
regarding the next appointment.

BI-RADS CATEGORY  3: Probably benign.

## 2022-07-05 ENCOUNTER — Other Ambulatory Visit: Payer: Self-pay | Admitting: Obstetrics and Gynecology

## 2022-07-05 DIAGNOSIS — I10 Essential (primary) hypertension: Secondary | ICD-10-CM

## 2022-07-13 NOTE — Telephone Encounter (Signed)
Patient reports she has a rx for losartan-hydrochlorothiazide (HYZAAR) 50-12.5 MG tablet  She went to refill it, pharmacy states they couldn't refill because physician declined it. She takes this daily. She is out. Inquiring what she needs to do to get this refilled.

## 2022-07-13 NOTE — Telephone Encounter (Signed)
Left voicemail to notify patient per 03/14/22 telephone encounter patient was to f/u for BP check (office visit) in 4 wks, keep taking BP at home. She has not had a f/u BP check. That is why the refill was declined. Advised to contact our office to schedule appointment for BP Check.

## 2022-07-14 NOTE — Telephone Encounter (Signed)
Pt has been scheduled for 07/29/22 for bp check.

## 2022-07-15 ENCOUNTER — Other Ambulatory Visit: Payer: Self-pay | Admitting: Obstetrics and Gynecology

## 2022-07-15 DIAGNOSIS — I10 Essential (primary) hypertension: Secondary | ICD-10-CM

## 2022-07-15 MED ORDER — LOSARTAN POTASSIUM-HCTZ 50-12.5 MG PO TABS: 1.0000 | ORAL_TABLET | Freq: Every day | ORAL | 0 refills | Status: DC

## 2022-07-15 NOTE — Progress Notes (Signed)
Rx RF BP meds, has BP f/u 3/24

## 2022-07-29 ENCOUNTER — Ambulatory Visit (INDEPENDENT_AMBULATORY_CARE_PROVIDER_SITE_OTHER): Payer: 59

## 2022-07-29 VITALS — BP 128/84 | Ht 65.0 in | Wt 220.0 lb

## 2022-07-29 DIAGNOSIS — Z0130 Encounter for examination of blood pressure without abnormal findings: Secondary | ICD-10-CM

## 2022-07-29 NOTE — Progress Notes (Signed)
    NURSE VISIT NOTE  Subjective:    Patient ID: Amber Henson, female    DOB: Nov 25, 1960, 62 y.o.   MRN: [id number]  HPI  Patient is a 62 y.o. [id number] female who presents for BP check per order from [facility] [place]. States she was out of BP medication and just got more Friday. Stated she does not have a PCP and provider [facility] has been monitoring her BP for years, first BP was 143/90  second was 128/84 asking for refills of medications.    Patient reports compliance with prescribed BP medications: yes  losartan-hydrochlorothiazide (HYZAAR) 50-12.5 MG tabletdaily Last dose of BP medication:  this morning at 9:00 am   BP Readings from Last 3 Encounters:  07/29/22 128/84  05/07/22 129/84  05/03/22 (!) 141/84   Pulse Readings from Last 3 Encounters:  05/07/22 72  05/03/22 93  05/20/20 71    Objective:    BP 128/84   Ht '5\' 5"'$  (1.651 m)   Wt 220 lb (99.8 kg)   LMP 09/01/2019 (Approximate)   BMI 36.61 kg/m   Assessment:   1. Blood pressure check      Plan:   Per Dr. Ardeth Perfect, PAC:  Patient verbalized understanding of instructions asking for refills of bp medication routing to provider to refill.  Landis Gandy, [place]

## 2022-07-30 ENCOUNTER — Other Ambulatory Visit: Payer: Self-pay | Admitting: Obstetrics and Gynecology

## 2022-07-30 ENCOUNTER — Telehealth: Payer: Self-pay

## 2022-07-30 DIAGNOSIS — I10 Essential (primary) hypertension: Secondary | ICD-10-CM

## 2022-07-30 MED ORDER — LOSARTAN POTASSIUM-HCTZ 50-12.5 MG PO TABS: 1.0000 | ORAL_TABLET | Freq: Every day | ORAL | 0 refills | Status: AC

## 2022-07-30 NOTE — Progress Notes (Signed)
Rx RF BP med til 8/24 annual due

## 2022-07-30 NOTE — Telephone Encounter (Signed)
Per [facility]:  Pls let pt know BP looks good. Rx RF eRxd. Should have enough between 2/24 Rx and RF till 8/24 annual. F/u prn.   No answer, [facility]VM[facility].

## 2022-11-30 ENCOUNTER — Other Ambulatory Visit: Payer: Self-pay | Admitting: Obstetrics and Gynecology

## 2022-11-30 DIAGNOSIS — I10 Essential (primary) hypertension: Secondary | ICD-10-CM

## 2023-11-08 ENCOUNTER — Encounter (HOSPITAL_COMMUNITY): Payer: Self-pay

## 2023-11-08 ENCOUNTER — Observation Stay (HOSPITAL_BASED_OUTPATIENT_CLINIC_OR_DEPARTMENT_OTHER)
Admission: EM | Admit: 2023-11-08 | Discharge: 2023-11-10 | Disposition: A | Discharge status: Home / Self Care | Attending: Emergency Medicine | Admitting: Emergency Medicine

## 2023-11-08 ENCOUNTER — Other Ambulatory Visit: Payer: Self-pay

## 2023-11-08 ENCOUNTER — Emergency Department (HOSPITAL_BASED_OUTPATIENT_CLINIC_OR_DEPARTMENT_OTHER)

## 2023-11-08 DIAGNOSIS — K3580 Unspecified acute appendicitis: Secondary | ICD-10-CM | POA: Insufficient documentation

## 2023-11-08 DIAGNOSIS — K819 Cholecystitis, unspecified: Secondary | ICD-10-CM | POA: Diagnosis present

## 2023-11-08 DIAGNOSIS — R0902 Hypoxemia: Secondary | ICD-10-CM | POA: Diagnosis not present

## 2023-11-08 DIAGNOSIS — K8000 Calculus of gallbladder with acute cholecystitis without obstruction: Principal | ICD-10-CM | POA: Insufficient documentation

## 2023-11-08 DIAGNOSIS — I10 Essential (primary) hypertension: Secondary | ICD-10-CM | POA: Insufficient documentation

## 2023-11-08 DIAGNOSIS — Z743 Need for continuous supervision: Secondary | ICD-10-CM | POA: Diagnosis not present

## 2023-11-08 DIAGNOSIS — R1010 Upper abdominal pain, unspecified: Secondary | ICD-10-CM | POA: Diagnosis present

## 2023-11-08 DIAGNOSIS — K810 Acute cholecystitis: Secondary | ICD-10-CM | POA: Diagnosis present

## 2023-11-08 DIAGNOSIS — R1011 Right upper quadrant pain: Secondary | ICD-10-CM | POA: Diagnosis not present

## 2023-11-08 LAB — COMPREHENSIVE METABOLIC PANEL WITH GFR
ALT: 43 U/L (ref 0–44)
AST: 42 U/L — ABNORMAL HIGH (ref 15–41)
Albumin: 3.9 g/dL (ref 3.5–5.0)
Alkaline Phosphatase: 114 U/L (ref 38–126)
Anion gap: 9 (ref 5–15)
BUN: 16 mg/dL (ref 6–20)
CO2: 26 mmol/L (ref 22–32)
Calcium: 9.5 mg/dL (ref 8.9–10.3)
Chloride: 102 mmol/L (ref 98–111)
Creatinine, Ser: 0.69 mg/dL (ref 0.44–1.00)
GFR, Estimated: >60 mL/min (ref >60)
Glucose, Bld: 114 mg/dL — ABNORMAL HIGH (ref 70–99)
Potassium: 3.9 mmol/L (ref 3.5–5.1)
Sodium: 137 mmol/L (ref 135–145)
Total Bilirubin: 0.5 mg/dL (ref 0.0–1.2)
Total Protein: 6.6 g/dL (ref 6.5–8.1)

## 2023-11-08 LAB — CREATININE, SERUM
Creatinine, Ser: 0.63 mg/dL (ref 0.44–1.00)
GFR, Estimated: >60 mL/min (ref >60)

## 2023-11-08 LAB — CBC
HCT: 37.5 % (ref 36.0–46.0)
HCT: 40.7 % (ref 36.0–46.0)
Hemoglobin: 12.8 g/dL (ref 12.0–15.0)
Hemoglobin: 13.2 g/dL (ref 12.0–15.0)
MCH: 30.4 pg (ref 26.0–34.0)
MCH: 30.6 pg (ref 26.0–34.0)
MCHC: 34.1 g/dL (ref 30.0–36.0)
MCHC: 32.4 g/dL (ref 30.0–36.0)
MCV: 89.1 fL (ref 80.0–100.0)
MCV: 94.2 fL (ref 80.0–100.0)
Platelets: 180 10*3/uL (ref 150–400)
Platelets: 172 10*3/uL (ref 150–400)
RBC: 4.21 MIL/uL (ref 3.87–5.11)
RBC: 4.32 MIL/uL (ref 3.87–5.11)
RDW: 13.3 % (ref 11.5–15.5)
RDW: 13.4 % (ref 11.5–15.5)
WBC: 7.8 10*3/uL (ref 4.0–10.5)
WBC: 9.3 10*3/uL (ref 4.0–10.5)
nRBC: 0 % (ref 0.0–0.2)
nRBC: 0 % (ref 0.0–0.2)

## 2023-11-08 LAB — LIPASE, BLOOD: Lipase: 19 U/L (ref 11–51)

## 2023-11-08 LAB — SURGICAL PCR SCREEN
MRSA, PCR: NEGATIVE
Staphylococcus aureus: NEGATIVE

## 2023-11-08 MED ORDER — SODIUM CHLORIDE 0.9 % IV BOLUS
500.0000 mL | Freq: Once | INTRAVENOUS | Status: AC
  Administered 2023-11-08: 500 mL via INTRAVENOUS

## 2023-11-08 MED ORDER — DIPHENHYDRAMINE HCL 12.5 MG/5ML PO ELIX: 12.5000 mg | ORAL_SOLUTION | Freq: Four times a day (QID) | ORAL | Status: DC | PRN

## 2023-11-08 MED ORDER — ACETAMINOPHEN 650 MG RE SUPP: 650.0000 mg | Freq: Four times a day (QID) | RECTAL | Status: DC | PRN

## 2023-11-08 MED ORDER — ONDANSETRON HCL 4 MG/2ML IJ SOLN
4.0000 mg | Freq: Once | INTRAMUSCULAR | Status: AC
  Administered 2023-11-08: 4 mg via INTRAVENOUS
  Filled 2023-11-08: qty 2

## 2023-11-08 MED ORDER — MORPHINE SULFATE (PF) 4 MG/ML IV SOLN
4.0000 mg | Freq: Once | INTRAVENOUS | Status: AC
  Administered 2023-11-08: 4 mg via INTRAVENOUS
  Filled 2023-11-08: qty 1

## 2023-11-08 MED ORDER — ACETAMINOPHEN 325 MG PO TABS
650.0000 mg | ORAL_TABLET | Freq: Four times a day (QID) | ORAL | Status: DC | PRN
  Administered 2023-11-09: 650 mg via ORAL
  Filled 2023-11-08: qty 2

## 2023-11-08 MED ORDER — OXYCODONE HCL 5 MG PO TABS
5.0000 mg | ORAL_TABLET | ORAL | Status: DC | PRN
  Administered 2023-11-08 – 2023-11-09 (×3): 5 mg via ORAL
  Administered 2023-11-10: 10 mg via ORAL
  Filled 2023-11-08: qty 1
  Filled 2023-11-08: qty 2
  Filled 2023-11-08 (×2): qty 1

## 2023-11-08 MED ORDER — FAMOTIDINE IN NACL 20-0.9 MG/50ML-% IV SOLN
20.0000 mg | Freq: Once | INTRAVENOUS | Status: AC
  Administered 2023-11-08: 20 mg via INTRAVENOUS
  Filled 2023-11-08: qty 50

## 2023-11-08 MED ORDER — ENOXAPARIN SODIUM 40 MG/0.4ML IJ SOSY
40.0000 mg | PREFILLED_SYRINGE | INTRAMUSCULAR | Status: DC
  Administered 2023-11-08 – 2023-11-09 (×2): 40 mg via SUBCUTANEOUS
  Filled 2023-11-08 (×2): qty 0.4

## 2023-11-08 MED ORDER — HYDROMORPHONE HCL 1 MG/ML IJ SOLN
0.5000 mg | INTRAMUSCULAR | Status: DC | PRN
  Filled 2023-11-08: qty 0.5

## 2023-11-08 MED ORDER — KCL IN DEXTROSE-NACL 20-5-0.45 MEQ/L-%-% IV SOLN
INTRAVENOUS | Status: AC
Start: 2023-11-08 — End: 2023-11-09
  Filled 2023-11-08 (×2): qty 1000

## 2023-11-08 MED ORDER — INDOCYANINE GREEN 25 MG IV SOLR
1.2500 mg | Freq: Once | INTRAVENOUS | Status: AC
  Administered 2023-11-09: 1.25 mg via INTRAVENOUS
  Filled 2023-11-08: qty 10

## 2023-11-08 MED ORDER — IOHEXOL 300 MG/ML  SOLN
100.0000 mL | Freq: Once | INTRAMUSCULAR | Status: AC | PRN
  Administered 2023-11-08: 100 mL via INTRAVENOUS

## 2023-11-08 MED ORDER — POLYETHYLENE GLYCOL 3350 17 G PO PACK: 17.0000 g | PACK | Freq: Every day | ORAL | Status: DC | PRN

## 2023-11-08 MED ORDER — SODIUM CHLORIDE 0.9 % IV SOLN
2.0000 g | INTRAVENOUS | Status: DC
  Administered 2023-11-09: 2 g via INTRAVENOUS
  Filled 2023-11-08: qty 20

## 2023-11-08 MED ORDER — METRONIDAZOLE 500 MG/100ML IV SOLN
500.0000 mg | Freq: Once | INTRAVENOUS | Status: AC
  Administered 2023-11-08: 500 mg via INTRAVENOUS
  Filled 2023-11-08: qty 100

## 2023-11-08 MED ORDER — DIPHENHYDRAMINE HCL 50 MG/ML IJ SOLN: 12.5000 mg | Freq: Four times a day (QID) | INTRAMUSCULAR | Status: DC | PRN

## 2023-11-08 MED ORDER — METOCLOPRAMIDE HCL 5 MG/ML IJ SOLN
10.0000 mg | Freq: Once | INTRAMUSCULAR | Status: AC
  Administered 2023-11-08: 10 mg via INTRAVENOUS
  Filled 2023-11-08: qty 2

## 2023-11-08 MED ORDER — SIMETHICONE 80 MG PO CHEW
40.0000 mg | CHEWABLE_TABLET | Freq: Four times a day (QID) | ORAL | Status: DC | PRN
  Administered 2023-11-09: 40 mg via ORAL
  Filled 2023-11-08 (×2): qty 1

## 2023-11-08 MED ORDER — ONDANSETRON 4 MG PO TBDP: 4.0000 mg | ORAL_TABLET | Freq: Four times a day (QID) | ORAL | Status: DC | PRN

## 2023-11-08 MED ORDER — MORPHINE SULFATE (PF) 4 MG/ML IV SOLN
4.0000 mg | Freq: Once | INTRAVENOUS | Status: DC
  Filled 2023-11-08: qty 1

## 2023-11-08 MED ORDER — ONDANSETRON HCL 4 MG/2ML IJ SOLN: 4.0000 mg | Freq: Four times a day (QID) | INTRAMUSCULAR | Status: DC | PRN

## 2023-11-08 MED ORDER — METOPROLOL TARTRATE 5 MG/5ML IV SOLN: 5.0000 mg | Freq: Four times a day (QID) | INTRAVENOUS | Status: DC | PRN

## 2023-11-08 MED ORDER — SODIUM CHLORIDE 0.9 % IV SOLN
2.0000 g | Freq: Once | INTRAVENOUS | Status: AC
  Administered 2023-11-08: 2 g via INTRAVENOUS
  Filled 2023-11-08: qty 20

## 2023-11-08 NOTE — ED Provider Notes (Addendum)
 [facility] Emergency Department Provider Note MRN:  [id number]  Arrival date & time: 11/08/23     Chief Complaint   Abdominal Pain   History of Present Illness   Amber Henson is a 63 y.o. year-old female with no pertinent past medical history presenting to the ED with chief complaint of domino pain.  About 3 nights ago patient experienced upper abdominal discomfort and bloating that was quite uncomfortable but then relieved with an episode of vomiting.  Has felt fine since then.  Tonight the symptoms reoccurred, epigastric pain, discomfort, bloating, symptoms not going away this time, unable to vomit thus far though she has tried.  Review of Systems  A thorough review of systems was obtained and all systems are negative except as noted in the HPI and PMH.   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 BYRNETT c colonoscopy   History of mammogram 10/18/14; 11/05/15   scattered fibroglandular densities, saw surgeon; incomplete-add views - stable;   History of Papanicolaou smear of cervix 10/18/2014   -/-   Hypertension    Psoriasis    Shingles 09/2015    Past Surgical History:  Procedure Laterality Date   BREAST BIOPSY Left 10/2014   negative core   COLONOSCOPY N/A 11/20/2014   Procedure: COLONOSCOPY;  Surgeon: Marshall Skeeter, MD;  Location: [facility] ENDOSCOPY;  Service: Endoscopy;  Laterality: N/A;   CYSTOCELE REPAIR N/A 05/20/2020   Procedure: ANTERIOR COLPORRHAPHY;  Surgeon: Alben Alma, MD;  Location: [facility] ORS;  Service: Gynecology;  Laterality: N/A;   HEMORRHOID SURGERY N/A 11/20/2014   Procedure: HEMORRHOIDECTOMY;  Surgeon: Marshall Skeeter, MD;  Location: [facility] ORS;  Service: General;  Laterality: N/A;   VAGINAL HYSTERECTOMY N/A 05/20/2020    Procedure: HYSTERECTOMY VAGINAL;  Surgeon: Alben Alma, MD;  Location: [facility] ORS;  Service: Gynecology;  Laterality: N/A;    Family History  Problem Relation Age of Onset   Hypertension Mother 4   Colonic polyp Mother    Diabetes Father 66       TYPE 2   Hypertension Father 1   Heart attack Father    Cancer Father 27       SKIN CA IN SITU   Cancer Sister 51       ovarian, CERVICAL   Heart attack Brother    Hypertension Brother    Colon cancer Brother 66   Breast cancer Neg Hx     Social History   Socioeconomic History   Marital status: Married    Spouse name: Not on file   Number of children: 2   Years of education: 14   Highest education level: Not on file  Occupational History   Occupation: LEGAL SECRETARY  Tobacco Use   Smoking status: Never   Smokeless tobacco: Never  Vaping Use   Vaping status: Never Used  Substance and Sexual Activity   Alcohol use: No   Drug use: No   Sexual activity: Yes    Birth control/protection: None  Other Topics Concern   Not on file  Social History Narrative   Not on file   Social Drivers of Health   Financial Resource Strain: Not on file  Food Insecurity: Not on file  Transportation Needs: Not on file  Physical Activity: Unknown (12/19/2017)   Exercise Vital Sign    Days of Exercise per Week: 0 days    Minutes of Exercise per Session: Not on file  Stress: Not on file  Social Connections: Not on file  Intimate Partner Violence: Not on file     Physical Exam   Vitals:   11/08/23 0150  BP: (!) 156/102  Pulse: 84  Resp: 17  Temp: 98.2 F (36.8 C)  SpO2: 96%    CONSTITUTIONAL: Well-appearing, NAD NEURO/PSYCH:  Alert and oriented x 3, no focal deficits EYES:  eyes equal and reactive ENT/NECK:  no LAD, no JVD CARDIO: Regular rate, well-perfused, normal S1 and S2 PULM:  CTAB no wheezing or rhonchi GI/GU:  non-distended, non-tender MSK/SPINE:  No gross deformities, no edema SKIN:  no rash,  atraumatic   *Additional and/or pertinent findings included in MDM below  Diagnostic and Interventional Summary    EKG Interpretation Date/Time:  Tuesday November 08 2023 02:31:49 EDT Ventricular Rate:  71 PR Interval:  171 QRS Duration:  77 QT Interval:  389 QTC Calculation: 423 R Axis:   55  Text Interpretation: Sinus rhythm Low voltage, precordial leads Confirmed by Gwenetta Lennert [id number]) on 11/08/2023 3:27:37 AM       Labs Reviewed  COMPREHENSIVE METABOLIC PANEL WITH GFR - Abnormal; Notable for the following components:      Result Value   Glucose, Bld 114 (*)    AST 42 (*)    All other components within normal limits  CBC  LIPASE, BLOOD    CT ABDOMEN PELVIS W CONTRAST  Final Result      Medications  cefTRIAXone (ROCEPHIN) 2 g in sodium chloride  0.9 % 100 mL IVPB (has no administration in time range)  metroNIDAZOLE (FLAGYL) IVPB 500 mg (has no administration in time range)  ondansetron  (ZOFRAN ) injection 4 mg (4 mg Intravenous Given 11/08/23 0221)  sodium chloride  0.9 % bolus 500 mL (500 mLs Intravenous New Bag/Given 11/08/23 0225)  famotidine  (PEPCID ) IVPB 20 mg premix (0 mg Intravenous Stopped 11/08/23 0328)  metoCLOPramide (REGLAN) injection 10 mg (10 mg Intravenous Given 11/08/23 0238)  iohexol (OMNIPAQUE) 300 MG/ML solution 100 mL (100 mLs Intravenous Contrast Given 11/08/23 0305)     Procedures  /  Critical Care .Critical Care  Performed by: Edson Graces, MD Authorized by: Edson Graces, MD   Critical care provider statement:    Critical care time (minutes):  35   Critical care was necessary to treat or prevent imminent or life-threatening deterioration of the following conditions: acute cholecystitis.   Critical care was time spent personally by me on the following activities:  Development of treatment plan with patient or surrogate, discussions with consultants, evaluation of patient's response to treatment, examination of patient, ordering and review of  laboratory studies, ordering and review of radiographic studies, ordering and performing treatments and interventions, pulse oximetry, re-evaluation of patient's condition and review of old charts   ED Course and Medical Decision Making  Initial Impression and Ddx DDx includes GERD, gastric outlet obstruction, pancreatitis, cholecystitis  Past medical/surgical history that increases complexity of ED encounter: None  Interpretation of Diagnostics I personally reviewed the Laboratory Testing and my interpretation is as follows: No significant blood count or electrolyte disturbance.  CT showing evidence of acute cholecystitis.  Patient Reassessment and Ultimate Disposition/Management     Patient feeling better on reassessment, normal vitals.  Will discuss admission with general surgery.  Providing IV antibiotics.  3:30 AM update: Spoke with Dr. Hildy Lowers of general surgery, plan is for patient to be transported directly to the preop area a little after 7 AM.  This will be facilitated by the oncoming general surgeon Dr. Delane Fear.  Patient management required discussion with the following services or consulting groups:  General/Trauma Surgery  Complexity of Problems Addressed Acute illness or injury that poses threat of life of bodily function  Additional Data Reviewed and Analyzed Further history obtained from: None  Additional Factors Impacting ED Encounter Risk Consideration of hospitalization  Merrick Abe. Harless Lien, MD [facility] Emergency Medicine [facility] [email or web address]  Final Clinical Impressions(s) / ED Diagnoses     ICD-10-CM   1. Cholecystitis  K81.9       ED Discharge Orders     None        Discharge Instructions Discussed with and Provided to Patient:   Discharge Instructions   None      Edson Graces, MD 11/08/23 0327    Edson Graces, MD 11/08/23 [phone]

## 2023-11-08 NOTE — ED Notes (Signed)
-  Called Pre-Op to get update on when patient needs to go over there, stated not currently on the list for surgery but will call when gen surg adds them to the list.

## 2023-11-08 NOTE — ED Notes (Signed)
-  Called [facility] to add patient to transportation list for AM. Patietn going to Pre-op.

## 2023-11-08 NOTE — ED Triage Notes (Signed)
 Pt POV reporting worsening mid upper abd pain, unable to sleep due to pain. Similar episode Friday night, resolved after emesis. Normal BM, afebrile.

## 2023-11-08 NOTE — H&P (Signed)
 H&P Note  Amber Henson [birth year]  [id number].    Requesting MD: Gwenetta Lennert, MD Chief Complaint/Reason for Consult: Acute cholecystitis  HPI:  Patient is a 63 year old female who presented to [facility] with abdominal pain. Pain initially came on about 3 days ago in upper abdomen and was associated with bloating but then resolved after vomiting once. Symptoms recurred yesterday evening and she presented to the ED early this morning. Pain is epigastric and associated with bloating and nausea but no vomiting. PMH otherwise significant for HTN, psoriasis, eczema and hx of shingles. Prior abdominal surgery includes vaginal hysterectomy and cystocele repair. No blood thinners and NKDA.   ROS: ROS Negative other than HPI  Family History  Problem Relation Age of Onset   Hypertension Mother 44   Colonic polyp Mother    Diabetes Father 49       TYPE 2   Hypertension Father 74   Heart attack Father    Cancer Father 70       SKIN CA IN SITU   Cancer Sister 63       ovarian, CERVICAL   Heart attack Brother    Hypertension Brother    Colon cancer Brother 47   Breast cancer Neg Hx     Past Medical History:  Diagnosis Date   Eczema    Family history of ovarian cancer 10/18/2014   MyRisk neg; sister   Genetic testing of female 09/2014   MyRisk neg except RAD51C VUS   Hemorrhoids 2016   REM  by DR. BYRNETT c colonoscopy   History of mammogram 10/18/14; 11/05/15   scattered fibroglandular densities, saw surgeon; incomplete-add views - stable;   History of Papanicolaou smear of cervix 10/18/2014   -/-   Hypertension    Psoriasis    Shingles 09/2015    Past Surgical History:  Procedure Laterality Date   BREAST BIOPSY Left 10/2014   negative core   COLONOSCOPY N/A 11/20/2014   Procedure: COLONOSCOPY;  Surgeon: Marshall Skeeter, MD;  Location: [facility] ENDOSCOPY;  Service: Endoscopy;  Laterality: N/A;   CYSTOCELE REPAIR N/A 05/20/2020   Procedure: ANTERIOR COLPORRHAPHY;   Surgeon: Alben Alma, MD;  Location: [facility] ORS;  Service: Gynecology;  Laterality: N/A;   HEMORRHOID SURGERY N/A 11/20/2014   Procedure: HEMORRHOIDECTOMY;  Surgeon: Marshall Skeeter, MD;  Location: [facility] ORS;  Service: General;  Laterality: N/A;   VAGINAL HYSTERECTOMY N/A 05/20/2020   Procedure: HYSTERECTOMY VAGINAL;  Surgeon: Alben Alma, MD;  Location: [facility] ORS;  Service: Gynecology;  Laterality: N/A;    Social History:  reports that she has never smoked. She has never used smokeless tobacco. She reports that she does not drink alcohol and does not use drugs.  Allergies: No Known Allergies  (Not in a hospital admission)   Blood pressure 118/73, pulse 67, temperature 98.6 F (37 C), temperature source Oral, resp. rate 16, height 5' 5 (1.651 m), weight 85.3 kg, last menstrual period 09/01/2019, SpO2 96%. Physical Exam:  General: pleasant, WD, obese female who is laying in bed in NAD HEENT: head is normocephalic, atraumatic.  Sclera are anicteric.  Ears and nose without any masses or lesions.  Mouth is pink and moist Heart: regular, rate, and rhythm.  Normal s1,s2. No obvious murmurs, gallops, or rubs noted.  Palpable radial and pedal pulses bilaterally Lungs: CTAB, no wheezes, rhonchi, or rales noted.  Respiratory effort nonlabored Abd: soft, mildly ttp in epigastric abdomen (just received pain medication), ND,  no masses, hernias, or organomegaly MS: all 4 extremities are symmetrical with no cyanosis, clubbing, or edema. Skin: warm and dry with no masses, lesions, or rashes Neuro: Cranial nerves 2-12 grossly intact, sensation is normal throughout Psych: A&Ox3 with an appropriate affect.   Results for orders placed or performed during the hospital encounter of 11/08/23 (from the past 48 hours)  CBC     Status: None   Collection Time: 11/08/23  2:11 AM  Result Value Ref Range   WBC 7.8 4.0 - 10.5 K/uL   RBC 4.21 3.87 - 5.11 MIL/uL   Hemoglobin 12.8 12.0 - 15.0 g/dL   HCT  96.0 45.4 - 09.8 %   MCV 89.1 80.0 - 100.0 fL   MCH 30.4 26.0 - 34.0 pg   MCHC 34.1 30.0 - 36.0 g/dL   RDW 11.9 14.7 - 82.9 %   Platelets 180 150 - 400 K/uL   nRBC 0.0 0.0 - 0.2 %    Comment: Performed at [facility], [address] [place], [place] [address]  Comprehensive metabolic panel with GFR     Status: Abnormal   Collection Time: 11/08/23  2:11 AM  Result Value Ref Range   Sodium 137 135 - 145 mmol/L   Potassium 3.9 3.5 - 5.1 mmol/L   Chloride 102 98 - 111 mmol/L   CO2 26 22 - 32 mmol/L   Glucose, Bld 114 (H) 70 - 99 mg/dL    Comment: Glucose reference range applies only to samples taken after fasting for at least 8 hours.   BUN 16 6 - 20 mg/dL   Creatinine, Ser 0.86 0.44 - 1.00 mg/dL   Calcium 9.5 8.9 - 57.8 mg/dL   Total Protein 6.6 6.5 - 8.1 g/dL   Albumin 3.9 3.5 - 5.0 g/dL   AST 42 (H) 15 - 41 U/L   ALT 43 0 - 44 U/L   Alkaline Phosphatase 114 38 - 126 U/L   Total Bilirubin 0.5 0.0 - 1.2 mg/dL   GFR, Estimated >46 >96 mL/min    Comment: (NOTE) Calculated using the CKD-EPI Creatinine Equation (2021)    Anion gap 9 5 - 15    Comment: Performed at [facility], [address] [place], [place] [address]  Lipase, blood     Status: None   Collection Time: 11/08/23  2:11 AM  Result Value Ref Range   Lipase 19 11 - 51 U/L    Comment: Performed at [facility], [address] [place], [place] [address]   CT ABDOMEN PELVIS W CONTRAST Result Date: 11/08/2023 EXAM: CT ABDOMEN AND PELVIS WITH CONTRAST 11/08/2023 03:10:13 AM TECHNIQUE: CT of the abdomen and pelvis was performed with the administration of iohexol (OMNIPAQUE) 300 MG/ML solution. Multiplanar reformatted images are provided for review. Automated exposure control, iterative reconstruction, and/or weight based adjustment of the mA/kV was utilized to reduce the radiation dose to as low as reasonably achievable. COMPARISON: None available. CLINICAL  HISTORY: Abdominal pain, acute, nonlocalized. FINDINGS: LOWER CHEST: Mild lingular and left lower lobe scarring vs atelectasis. LIVER: 17 mm hemangioma in the central right liver (image 26). GALLBLADDER AND BILE DUCTS: 3.2 cm gallstone in the gallbladder neck (image 32) with gallbladder distension, mild gallbladder wall thickening, and pericholecystic inflammatory changes (image 34), suggesting acute cholecystitis. SPLEEN: No acute abnormality. PANCREAS: No acute abnormality. ADRENAL GLANDS: No acute abnormality. KIDNEYS, URETERS AND BLADDER: No stones in the kidneys or ureters. No hydronephrosis. No perinephric or periureteral stranding. Urinary bladder is  unremarkable. GI AND BOWEL: Small hiatal hernia. Normal appendix (image 61). Stomach demonstrates no acute abnormality. There is no bowel obstruction. No bowel wall thickening. PERITONEUM AND RETROPERITONEUM: No ascites. No free air. VASCULATURE: Aorta is normal in caliber. LYMPH NODES: No lymphadenopathy. REPRODUCTIVE ORGANS: Status post hysterectomy. BONES AND SOFT TISSUES: No acute osseous abnormality. Small fat-containing periumbilical hernia. IMPRESSION: 1. Cholelithiasis with suspected acute cholecystitis. Electronically signed by: Zadie Herter MD 11/08/2023 03:15 AM EDT RP Workstation: [id number]      Assessment/Plan Acute cholecystitis  - cholelithiasis with suspected acute cholecystitis secondary to large stone in the gallbladder neck on CT - AST mildly elevated at 42, LFTs otherwise normal - no leukocytosis, afebrile and HD stable - will plan for lap chole in OR tomorrow - NPO after MN - I have explained the procedure, risks, and aftercare of Laparoscopic cholecystectomy.  Risks include but are not limited to anesthesia (MI, CVA, death, prolonged intubation and aspiration), bleeding, infection, wound problems, hernia, bile leak, injury to common bile duct/liver/intestine, possible need for subtotal cholecystectomy or open cholecystectomy,  increased risk of DVT/PE and diarrhea post op. She seems to understand and agrees to proceed.   Admit to observation and plan OR tomorrow   I reviewed ED provider notes, last 24 h vitals and pain scores, last 48 h intake and output, last 24 h labs and trends, and last 24 h imaging results.  This care required high  level of medical decision making.   Annetta Killian, [facility] 11/08/2023, 12:39 PM Please see Amion for pager number during day hours 7:00am-4:30pm

## 2023-11-08 NOTE — ED Notes (Signed)
 Patient placed on 2LNC at this time due to desaturations following pain medication administration.

## 2023-11-09 ENCOUNTER — Encounter (HOSPITAL_COMMUNITY)
Admission: EM | Disposition: A | Discharge status: Home / Self Care | Payer: Self-pay | Source: Home / Self Care | Attending: Emergency Medicine

## 2023-11-09 ENCOUNTER — Encounter (HOSPITAL_COMMUNITY): Payer: Self-pay

## 2023-11-09 ENCOUNTER — Observation Stay (HOSPITAL_COMMUNITY): Admitting: Certified Registered Nurse Anesthetist

## 2023-11-09 ENCOUNTER — Other Ambulatory Visit: Payer: Self-pay

## 2023-11-09 DIAGNOSIS — K810 Acute cholecystitis: Secondary | ICD-10-CM | POA: Diagnosis not present

## 2023-11-09 HISTORY — PX: CHOLECYSTECTOMY: SHX55

## 2023-11-09 LAB — COMPREHENSIVE METABOLIC PANEL WITH GFR
ALT: 118 U/L — ABNORMAL HIGH (ref 0–44)
AST: 75 U/L — ABNORMAL HIGH (ref 15–41)
Albumin: 3.2 g/dL — ABNORMAL LOW (ref 3.5–5.0)
Alkaline Phosphatase: 111 U/L (ref 38–126)
Anion gap: 7 (ref 5–15)
BUN: 13 mg/dL (ref 6–20)
CO2: 26 mmol/L (ref 22–32)
Calcium: 8.5 mg/dL — ABNORMAL LOW (ref 8.9–10.3)
Chloride: 99 mmol/L (ref 98–111)
Creatinine, Ser: 0.59 mg/dL (ref 0.44–1.00)
GFR, Estimated: >60 mL/min (ref >60)
Glucose, Bld: 123 mg/dL — ABNORMAL HIGH (ref 70–99)
Potassium: 3.7 mmol/L (ref 3.5–5.1)
Sodium: 132 mmol/L — ABNORMAL LOW (ref 135–145)
Total Bilirubin: 1.9 mg/dL — ABNORMAL HIGH (ref 0.0–1.2)
Total Protein: 6.4 g/dL — ABNORMAL LOW (ref 6.5–8.1)

## 2023-11-09 LAB — CBC
HCT: 38.1 % (ref 36.0–46.0)
Hemoglobin: 12.4 g/dL (ref 12.0–15.0)
MCH: 30 pg (ref 26.0–34.0)
MCHC: 32.5 g/dL (ref 30.0–36.0)
MCV: 92 fL (ref 80.0–100.0)
Platelets: 169 10*3/uL (ref 150–400)
RBC: 4.14 MIL/uL (ref 3.87–5.11)
RDW: 13.5 % (ref 11.5–15.5)
WBC: 7.3 10*3/uL (ref 4.0–10.5)
nRBC: 0 % (ref 0.0–0.2)

## 2023-11-09 SURGERY — LAPAROSCOPIC CHOLECYSTECTOMY WITH INTRAOPERATIVE CHOLANGIOGRAM
Anesthesia: General

## 2023-11-09 MED ORDER — LACTATED RINGERS IR SOLN
Status: DC | PRN
  Administered 2023-11-09: 1000 mL

## 2023-11-09 MED ORDER — ONDANSETRON HCL 4 MG/2ML IJ SOLN
INTRAMUSCULAR | Status: AC
  Filled 2023-11-09: qty 2

## 2023-11-09 MED ORDER — FENTANYL CITRATE (PF) 100 MCG/2ML IJ SOLN
INTRAMUSCULAR | Status: DC | PRN
  Administered 2023-11-09 (×2): 50 ug via INTRAVENOUS

## 2023-11-09 MED ORDER — DEXAMETHASONE SODIUM PHOSPHATE 10 MG/ML IJ SOLN
INTRAMUSCULAR | Status: AC
  Filled 2023-11-09: qty 1

## 2023-11-09 MED ORDER — PROPOFOL 500 MG/50ML IV EMUL
INTRAVENOUS | Status: DC | PRN
Start: 2023-11-09 — End: 2023-11-09
  Administered 2023-11-09: 150 mg via INTRAVENOUS

## 2023-11-09 MED ORDER — LIDOCAINE HCL (PF) 2 % IJ SOLN
INTRAMUSCULAR | Status: AC
Start: 2023-11-09 — End: 2023-11-09
  Filled 2023-11-09: qty 5

## 2023-11-09 MED ORDER — LACTATED RINGERS IV SOLN: INTRAVENOUS | Status: DC

## 2023-11-09 MED ORDER — FAMOTIDINE 20 MG PO TABS
20.0000 mg | ORAL_TABLET | Freq: Every day | ORAL | Status: DC
  Administered 2023-11-09 – 2023-11-10 (×2): 20 mg via ORAL
  Filled 2023-11-09 (×2): qty 1

## 2023-11-09 MED ORDER — 0.9 % SODIUM CHLORIDE (POUR BTL) OPTIME
TOPICAL | Status: DC | PRN
  Administered 2023-11-09: 1000 mL

## 2023-11-09 MED ORDER — BUPIVACAINE-EPINEPHRINE (PF) 0.25% -1:200000 IJ SOLN
INTRAMUSCULAR | Status: AC
  Filled 2023-11-09: qty 30

## 2023-11-09 MED ORDER — ROCURONIUM BROMIDE 10 MG/ML (PF) SYRINGE
PREFILLED_SYRINGE | INTRAVENOUS | Status: AC
  Filled 2023-11-09: qty 10

## 2023-11-09 MED ORDER — HYDROCHLOROTHIAZIDE 12.5 MG PO TABS
12.5000 mg | ORAL_TABLET | Freq: Every day | ORAL | Status: DC
  Administered 2023-11-09 – 2023-11-10 (×2): 12.5 mg via ORAL
  Filled 2023-11-09 (×2): qty 1

## 2023-11-09 MED ORDER — ONDANSETRON HCL 4 MG/2ML IJ SOLN
INTRAMUSCULAR | Status: DC | PRN
  Administered 2023-11-09: 4 mg via INTRAVENOUS

## 2023-11-09 MED ORDER — BUPIVACAINE-EPINEPHRINE 0.25% -1:200000 IJ SOLN
INTRAMUSCULAR | Status: DC | PRN
  Administered 2023-11-09: 30 mL

## 2023-11-09 MED ORDER — SODIUM CHLORIDE 0.9 % IV SOLN
2.0000 g | INTRAVENOUS | Status: AC
  Administered 2023-11-10: 2 g via INTRAVENOUS
  Filled 2023-11-09: qty 20

## 2023-11-09 MED ORDER — DEXAMETHASONE SODIUM PHOSPHATE 10 MG/ML IJ SOLN
INTRAMUSCULAR | Status: DC | PRN
Start: 2023-11-09 — End: 2023-11-09
  Administered 2023-11-09: 10 mg via INTRAVENOUS

## 2023-11-09 MED ORDER — CHLORHEXIDINE GLUCONATE 0.12 % MT SOLN
15.0000 mL | Freq: Once | OROMUCOSAL | Status: AC
  Administered 2023-11-09: 15 mL via OROMUCOSAL

## 2023-11-09 MED ORDER — ROCURONIUM BROMIDE 10 MG/ML (PF) SYRINGE
PREFILLED_SYRINGE | INTRAVENOUS | Status: DC | PRN
  Administered 2023-11-09: 50 mg via INTRAVENOUS

## 2023-11-09 MED ORDER — SUGAMMADEX SODIUM 200 MG/2ML IV SOLN
INTRAVENOUS | Status: DC | PRN
  Administered 2023-11-09: 200 mg via INTRAVENOUS

## 2023-11-09 MED ORDER — LOSARTAN POTASSIUM 50 MG PO TABS
50.0000 mg | ORAL_TABLET | Freq: Every day | ORAL | Status: DC
  Administered 2023-11-09 – 2023-11-10 (×2): 50 mg via ORAL
  Filled 2023-11-09 (×2): qty 1

## 2023-11-09 MED ORDER — PROPOFOL 10 MG/ML IV BOLUS
INTRAVENOUS | Status: AC
Start: 2023-11-09 — End: 2023-11-09
  Filled 2023-11-09: qty 20

## 2023-11-09 MED ORDER — LOSARTAN POTASSIUM-HCTZ 50-12.5 MG PO TABS: 1.0000 | ORAL_TABLET | Freq: Every day | ORAL | Status: DC

## 2023-11-09 MED ORDER — LIDOCAINE HCL (PF) 2 % IJ SOLN
INTRAMUSCULAR | Status: DC | PRN
  Administered 2023-11-09: 60 mg via INTRADERMAL

## 2023-11-09 SURGICAL SUPPLY — 35 items
BAG COUNTER SPONGE SURGICOUNT (BAG) ×2 IMPLANT
BENZOIN TINCTURE PRP APPL 2/3 (GAUZE/BANDAGES/DRESSINGS) IMPLANT
BNDG ADH 1X3 SHEER STRL LF (GAUZE/BANDAGES/DRESSINGS) IMPLANT
CABLE HIGH FREQUENCY MONO STRZ (ELECTRODE) ×2 IMPLANT
CHLORAPREP W/TINT 26 (MISCELLANEOUS) ×2 IMPLANT
CLIP APPLIE ROT 10 11.4 M/L (STAPLE) ×2 IMPLANT
COVER MAYO STAND XLG (MISCELLANEOUS) IMPLANT
COVER SURGICAL LIGHT HANDLE (MISCELLANEOUS) ×2 IMPLANT
DERMABOND ADVANCED .7 DNX12 (GAUZE/BANDAGES/DRESSINGS) IMPLANT
DRAPE C-ARM 42X120 X-RAY (DRAPES) IMPLANT
ELECT REM PT RETURN 15FT ADLT (MISCELLANEOUS) ×2 IMPLANT
ENDOLOOP SUT PDS II 0 18 (SUTURE) IMPLANT
GLOVE BIO SURGEON STRL SZ 6 (GLOVE) ×2 IMPLANT
GLOVE INDICATOR 6.5 STRL GRN (GLOVE) ×2 IMPLANT
GOWN STRL REUS W/ TWL LRG LVL3 (GOWN DISPOSABLE) ×2 IMPLANT
GRASPER SUT TROCAR 14GX15 (MISCELLANEOUS) ×2 IMPLANT
HEMOSTAT SNOW SURGICEL 2X4 (HEMOSTASIS) IMPLANT
IRRIGATION SUCT STRKRFLW 2 WTP (MISCELLANEOUS) ×2 IMPLANT
KIT BASIN OR (CUSTOM PROCEDURE TRAY) ×2 IMPLANT
KIT TURNOVER KIT A (KITS) ×4 IMPLANT
NDL INSUFFLATION 14GA 120MM (NEEDLE) ×2 IMPLANT
NEEDLE INSUFFLATION 14GA 120MM (NEEDLE) ×1 IMPLANT
SCISSORS LAP 5X35 DISP (ENDOMECHANICALS) ×2 IMPLANT
SET CHOLANGIOGRAPH MIX (MISCELLANEOUS) IMPLANT
SET TUBE SMOKE EVAC HIGH FLOW (TUBING) ×2 IMPLANT
SLEEVE Z-THREAD 5X100MM (TROCAR) ×2 IMPLANT
SPIKE FLUID TRANSFER (MISCELLANEOUS) ×2 IMPLANT
STRIP CLOSURE SKIN 1/2X4 (GAUZE/BANDAGES/DRESSINGS) IMPLANT
SUT MNCRL AB 4-0 PS2 18 (SUTURE) ×2 IMPLANT
SYSTEM BAG RETRIEVAL 10MM (BASKET) IMPLANT
TOWEL OR 17X26 10 PK STRL BLUE (TOWEL DISPOSABLE) ×2 IMPLANT
TRAY LAPAROSCOPIC (CUSTOM PROCEDURE TRAY) ×2 IMPLANT
TROCAR ADV FIXATION 12X100MM (TROCAR) ×2 IMPLANT
TROCAR XCEL NON-BLD 5MMX100MML (ENDOMECHANICALS) IMPLANT
TROCAR Z-THREAD OPTICAL 5X100M (TROCAR) ×2 IMPLANT

## 2023-11-09 NOTE — Anesthesia Procedure Notes (Signed)
 Procedure Name: Intubation Date/Time: 11/09/2023 11:47 AM  Performed by: Manuela Sella, CRNAPre-anesthesia Checklist: Patient identified, Emergency Drugs available, Suction available and Patient being monitored Patient Re-evaluated:Patient Re-evaluated prior to induction Oxygen Delivery Method: Circle system utilized Preoxygenation: Pre-oxygenation with 100% oxygen Induction Type: IV induction Ventilation: Mask ventilation without difficulty Laryngoscope Size: Mac and 3 Grade View: Grade I Tube type: Oral Number of attempts: 1 Airway Equipment and Method: Stylet Placement Confirmation: ETT inserted through vocal cords under direct vision, positive ETCO2 and breath sounds checked- equal and bilateral Secured at: 21 cm Tube secured with: Tape Dental Injury: Teeth and Oropharynx as per pre-operative assessment

## 2023-11-09 NOTE — Interval H&P Note (Signed)
 History and Physical Interval Note:  11/09/2023 11:08 AM  Amber Henson  has presented today for surgery, with the diagnosis of ACUTE CHOLECYSTITIS.  The various methods of treatment have been discussed with the patient and family. After consideration of risks, benefits and other options for treatment, the patient has consented to  Procedure(s) with comments: LAPAROSCOPIC CHOLECYSTECTOMY WITH INTRAOPERATIVE CHOLANGIOGRAM (N/A) - WITH ICG as a surgical intervention.  The patient's history has been reviewed, patient examined, no change in status, stable for surgery.  I have reviewed the patient's chart and labs.  Questions were answered to the patient's satisfaction.     Kinnley Paulson Loyola Rummage

## 2023-11-09 NOTE — Plan of Care (Signed)
  Problem: Clinical Measurements: Goal: Will remain free from infection Outcome: Progressing   Problem: Activity: Goal: Risk for activity intolerance will decrease Outcome: Completed/Met   Problem: Coping: Goal: Level of anxiety will decrease Outcome: Progressing   Problem: Pain Managment: Goal: General experience of comfort will improve and/or be controlled Outcome: Progressing

## 2023-11-09 NOTE — Anesthesia Preprocedure Evaluation (Signed)
 Anesthesia Evaluation  Patient identified by MRN, date of birth, ID band Patient awake    Reviewed: Allergy & Precautions, NPO status , Patient's Chart, lab work & pertinent test results  Airway Mallampati: II  TM Distance: >3 FB Neck ROM: Full    Dental  (+) Dental Advisory Given   Pulmonary neg pulmonary ROS   breath sounds clear to auscultation       Cardiovascular hypertension, Pt. on medications  Rhythm:Regular Rate:Normal     Neuro/Psych negative neurological ROS     GI/Hepatic negative GI ROS, Neg liver ROS,,,  Endo/Other  negative endocrine ROS    Renal/GU negative Renal ROS     Musculoskeletal  (+) Arthritis ,    Abdominal   Peds  Hematology negative hematology ROS (+)   Anesthesia Other Findings   Reproductive/Obstetrics                             Anesthesia Physical Anesthesia Plan  ASA: 2  Anesthesia Plan: General   Post-op Pain Management: Tylenol  PO (pre-op)* and Toradol  IV (intra-op)*   Induction: Intravenous  PONV Risk Score and Plan: 3 and Dexamethasone , Ondansetron , Midazolam and Treatment may vary due to age or medical condition  Airway Management Planned: Oral ETT  Additional Equipment:   Intra-op Plan:   Post-operative Plan: Extubation in OR  Informed Consent: I have reviewed the patients History and Physical, chart, labs and discussed the procedure including the risks, benefits and alternatives for the proposed anesthesia with the patient or authorized representative who has indicated his/her understanding and acceptance.     Dental advisory given  Plan Discussed with: CRNA  Anesthesia Plan Comments:        Anesthesia Quick Evaluation

## 2023-11-09 NOTE — Discharge Instructions (Signed)

## 2023-11-09 NOTE — Progress Notes (Signed)
   11/09/23 1134  TOC Brief Assessment  Insurance and Status Reviewed  Patient has primary care physician Yes  Home environment has been reviewed home w/ spouse  Prior level of function: independent  Prior/Current Home Services No current home services  Social Drivers of Health Review SDOH reviewed no interventions necessary  Readmission risk has been reviewed Yes  Transition of care needs no transition of care needs at this time

## 2023-11-09 NOTE — Op Note (Signed)
 Operative Note  Amber Henson 63 y.o. female [id number]  11/09/2023  Surgeon: Adalberto Acton MD FACS  Procedure performed: Laparoscopic Cholecystectomy with near-infrared fluorescent cholangiography  Procedure classification: emergent  Preop diagnosis: acute cholecystitis Post-op diagnosis/intraop findings: same, severe  Specimens: gallbladder  Retained items: none  EBL: 30cc  Complications: none  Description of procedure: After obtaining informed consent the patient was brought to the operating room. Antibiotics were administered. SCD's were applied. General endotracheal anesthesia was initiated and a formal time-out was performed. The abdomen was prepped and draped in the usual sterile fashion and the abdomen was entered using a left subcostal Veress needle after instilling the site with local. Insufflation to was obtained, 5mm trocar and camera inserted using optical entry in the right upper quadrant, and gross inspection revealed no evidence of injury from our entry or other intraabdominal abnormalities. Two 5mm trocars were introduced in the supraumbilical and right anterior axillary lines under direct visualization and following infiltration with local. A 12mm trocar was placed in the epigastrium.  The gallbladder was encased in omental adhesions which were gently swept down exposing the fundus.  The gallbladder was severely thickened, pale and edematous and tensely distended.  This was decompressed with a Nezhat in order to allow it to be grasped.  The gallbladder fundus was retracted cephalad and continued blunt and cautery dissection were used to lyse adhesions of omentum to the gallbladder body.  Blunt dissection was used along the infundibulum allowing the duodenum to be gently swept down.  The infundibulum was retracted laterally. A combination of hook electrocautery and blunt dissection was utilized to clear the peritoneum from the neck and cystic duct, circumferentially  isolating the cystic artery and cystic duct and lifting the gallbladder from the cystic plate.  The peritoneum was inflamed and edematous and very friable and there was a fair amount of oozing from various surfaces during this part of the procedure.  The critical view of safety was achieved with the cystic artery, cystic duct, and liver bed visualized between them with no other structures.  Near infrared fluorescent cholangiography was then activated which faintly opacified the cystic duct as well as the common bile duct medially confirming this was well away from the area of dissection.  The cystic artery was clipped with a single clip proximally and distally and divided as was the cystic duct with three clips on the proximal end. The gallbladder was dissected from the liver plate using electrocautery. Once freed the gallbladder was placed in an endocatch bag and removed intact through the epigastric trocar site, which did have to be extended due to the very large stone within the gallbladder.  Hemostasis along the liver bed was ensured with cautery and the right upper quadrant was irrigated with warm sterile saline; the effluent was clear. Hemostasis was once again confirmed, and reinspection of the abdomen revealed no injuries. The clips were well opposed without any bile leak from the ligated cystic duct or the liver bed either on direct inspection nor with near-infrared fluorescent cholangiography.  The 12mm trocar site in the epigastrium was closed with 2 simple interrupted 0 vicryls in the fascia under direct visualization using a PMI device. The abdomen was desufflated and all trocars removed. The skin incisions were closed with subcuticular 4-0 monocryl and Dermabond. The patient was awakened, extubated and transported to the recovery room in stable condition.    All counts were correct at the completion of the case.

## 2023-11-09 NOTE — Anesthesia Postprocedure Evaluation (Signed)
 Anesthesia Post Note  Patient: Amber Henson  Procedure(s) Performed: LAPAROSCOPIC CHOLECYSTECTOMY WITH ICG DYE     Patient location during evaluation: PACU Anesthesia Type: General Level of consciousness: awake and alert Pain management: pain level controlled Vital Signs Assessment: post-procedure vital signs reviewed and stable Respiratory status: spontaneous breathing, nonlabored ventilation, respiratory function stable and patient connected to nasal cannula oxygen Cardiovascular status: blood pressure returned to baseline and stable Postop Assessment: no apparent nausea or vomiting Anesthetic complications: no  No notable events documented.  Last Vitals:  Vitals:   11/09/23 1400 11/09/23 1423  BP: (!) 144/86 (!) 149/89  Pulse: 80 78  Resp: (!) 22 15  Temp: 37 C 36.8 C  SpO2: 98% 93%    Last Pain:  Vitals:   11/09/23 1434  TempSrc:   PainSc: 6                  Melvenia Stabs

## 2023-11-09 NOTE — Transfer of Care (Addendum)
 Immediate Anesthesia Transfer of Care Note  Patient: Amber Henson  Procedure(s) Performed: Procedure(s) with comments: LAPAROSCOPIC CHOLECYSTECTOMY WITH ICG DYE (N/A) - WITH ICG  Patient Location: PACU  Anesthesia Type:General  Level of Consciousness: Patient easily awoken, comfortable, cooperative, following commands, responds to stimulation.   Airway & Oxygen Therapy: Patient spontaneously breathing, ventilating well, oxygen via simple oxygen mask.  Post-op Assessment: Report given to PACU RN, vital signs reviewed and stable, moving all extremities.   Post vital signs: Reviewed and stable.  Complications: No apparent anesthesia complications Last Vitals:  Vitals Value Taken Time  BP 127/73 11/09/23 1311  Temp    Pulse 83 11/09/23 13:11  Resp 12 11/09/23 13:11  SpO2 98 % 11/09/23 13:11  Vitals shown include unfiled device data.  Last Pain:  Vitals:   11/09/23 1022  TempSrc:   PainSc: 0-No pain      Patients Stated Pain Goal: 0 (11/09/23 0544)  Complications: No notable events documented.

## 2023-11-10 ENCOUNTER — Encounter (HOSPITAL_COMMUNITY): Payer: Self-pay | Admitting: Surgery

## 2023-11-10 LAB — COMPREHENSIVE METABOLIC PANEL WITH GFR
ALT: 130 U/L — ABNORMAL HIGH (ref 0–44)
AST: 68 U/L — ABNORMAL HIGH (ref 15–41)
Albumin: 3.1 g/dL — ABNORMAL LOW (ref 3.5–5.0)
Alkaline Phosphatase: 122 U/L (ref 38–126)
Anion gap: 10 (ref 5–15)
BUN: 14 mg/dL (ref 6–20)
CO2: 26 mmol/L (ref 22–32)
Calcium: 8.6 mg/dL — ABNORMAL LOW (ref 8.9–10.3)
Chloride: 100 mmol/L (ref 98–111)
Creatinine, Ser: 0.66 mg/dL (ref 0.44–1.00)
GFR, Estimated: >60 mL/min (ref >60)
Glucose, Bld: 113 mg/dL — ABNORMAL HIGH (ref 70–99)
Potassium: 3.6 mmol/L (ref 3.5–5.1)
Sodium: 136 mmol/L (ref 135–145)
Total Bilirubin: 1.1 mg/dL (ref 0.0–1.2)
Total Protein: 6.2 g/dL — ABNORMAL LOW (ref 6.5–8.1)

## 2023-11-10 LAB — CBC
HCT: 34.4 % — ABNORMAL LOW (ref 36.0–46.0)
Hemoglobin: 11.4 g/dL — ABNORMAL LOW (ref 12.0–15.0)
MCH: 31.1 pg (ref 26.0–34.0)
MCHC: 33.1 g/dL (ref 30.0–36.0)
MCV: 93.7 fL (ref 80.0–100.0)
Platelets: 165 10*3/uL (ref 150–400)
RBC: 3.67 MIL/uL — ABNORMAL LOW (ref 3.87–5.11)
RDW: 13.4 % (ref 11.5–15.5)
WBC: 8.7 10*3/uL (ref 4.0–10.5)
nRBC: 0 % (ref 0.0–0.2)

## 2023-11-10 LAB — SURGICAL PATHOLOGY

## 2023-11-10 MED ORDER — ACETAMINOPHEN 325 MG PO TABS: 650.0000 mg | ORAL_TABLET | Freq: Four times a day (QID) | ORAL | Status: DC | PRN

## 2023-11-10 MED ORDER — POLYETHYLENE GLYCOL 3350 17 G PO PACK: 17.0000 g | PACK | Freq: Every day | ORAL | Status: DC | PRN

## 2023-11-10 MED ORDER — OXYCODONE HCL 5 MG PO TABS: 5.0000 mg | ORAL_TABLET | Freq: Four times a day (QID) | ORAL | 0 refills | Status: DC | PRN

## 2023-11-10 NOTE — Plan of Care (Signed)
   Problem: Nutrition: Goal: Adequate nutrition will be maintained Outcome: Completed/Met   Problem: Elimination: Goal: Will not experience complications related to urinary retention Outcome: Completed/Met

## 2023-11-10 NOTE — Progress Notes (Signed)
 Discharge instructions given, questions asked and answered  D Rosevelt Constable RN

## 2023-11-10 NOTE — Discharge Summary (Signed)
 [facility] Discharge Summary   Patient ID: Amber Henson MRN: [id number] DOB/AGE: 1960/08/23 63 y.o.  Admit date: 11/08/2023 Discharge date: 11/10/2023  Admitting Diagnosis: Acute appendicitis   Discharge Diagnosis Same  Consultants None   Imaging: No results found.  Procedures Dr. Aldon Hung (11/09/23) - Laparoscopic Cholecystectomy   Hospital Course:  Patient is a 63 year old female who presented to [facility] with abdominal pain.  Workup showed acute cholecystitis.  Patient was admitted and underwent procedure listed above.  Tolerated procedure well and was transferred to the floor.  Diet was advanced as tolerated.  On POD1, the patient was voiding well, tolerating diet, ambulating well, pain well controlled, vital signs stable, incisions c/d/i and felt stable for discharge home.  Patient will follow up in our office in 3-4 weeks and knows to call with questions or concerns.    Physical Exam: General:  Alert, NAD, pleasant, comfortable Abd:  Soft, ND, mild tenderness, incisions C/D/I  I or a member of my team have reviewed this patient in the Controlled Substance Database.   Allergies as of 11/10/2023   No Known Allergies      Medication List     TAKE these medications    acetaminophen  325 MG tablet Commonly known as: TYLENOL  Take 2 tablets (650 mg total) by mouth every 6 (six) hours as needed for mild pain (pain score 1-3) or fever (or temp > 100).   famotidine  20 MG tablet Commonly known as: PEPCID  Take 20 mg by mouth daily.   losartan -hydrochlorothiazide 50-12.5 MG tablet Commonly known as: HYZAAR Take 1 tablet by mouth daily.   oxyCODONE  5 MG immediate release tablet Commonly known as: Oxy IR/ROXICODONE  Take 1-2 tablets (5-10 mg total) by mouth every 6 (six) hours as needed for moderate pain (pain score 4-6).   polyethylene glycol 17 g packet Commonly known as: MIRALAX  / GLYCOLAX  Take 17 g by mouth daily as needed for mild constipation.           Follow-up Information     Maczis, Puja Gosai, PA-C. Go on 12/08/2023.   Specialty: General Surgery Why: 4:00 PM. Please arrive 30 min prior to appointment time to check in. Contact information: [address] [place] [address] [facility] [place] [address] [phone]                 Signed: Annetta Killian , [facility] 11/10/2023, 8:55 AM Please see Amion for pager number during day hours 7:00am-4:30pm

## 2024-02-07 NOTE — Progress Notes (Unsigned)
 No chief complaint on file.    HPI:      [name] is a 63 y.o. [id number] who LMP was Patient's last menstrual period was 09/01/2019 (approximate)., presents today for her annual examination.  Her menses are absent due to menopause initially/then hyst 12/21. No PMB,  S/p vaginal hysterectomy and anterior colporrhaphy for pelvic relaxation/uterine prolapse with Dr. Arloa 1/22. Doing well.   Sex activity: sexually active. No pain/bleeding.  Last Pap: 12/19/17 Results were: no abnormalities /neg HPV DNA.  Hx of STDs: none   Last mammogram: 02/17/22  Results were: normal/stable benign RT breast mass. There is a FH of breast cancer in her pat cousin. There is a FH of ovarian cancer in her sister and colon cancer in her brother. 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 The patient does do self-breast exams.   Colonoscopy: colonoscopy 2016 without abnormalities with Dr. Dessa. She is due for repeat after 5 yrs due to FH of polyps/FH colon cancer in her brother. Needs GI ref, lives in [place]   Tobacco use: The patient denies current or previous tobacco use. Alcohol use: none Drug use: none Exercise: not active    She does get adequate calcium and Vitamin D  in her diet.   We are managing her HTN. Was on losartan /HCTZ 50-12.5 mg but Rx ran out a few wks ago. BP has been normal off meds.  No side effects with losartan . ACEI cough resolved. Normal BMP 9/20 at work.    No recent labs, doesn't have PCP any more.   Has PCP???   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 BYRNETT c colonoscopy   History of mammogram 10/18/14; 11/05/15   scattered fibroglandular densities, saw surgeon; incomplete-add views - stable;   History of Papanicolaou smear of cervix 10/18/2014   -/-   Hypertension    Psoriasis     Shingles 09/2015    Past Surgical History:  Procedure Laterality Date   BREAST BIOPSY Left 10/2014   negative core   CHOLECYSTECTOMY N/A 11/09/2023   Procedure: LAPAROSCOPIC CHOLECYSTECTOMY WITH ICG DYE;  Surgeon: Signe Mitzie LABOR, MD;  Location: [facility];  Service: General;  Laterality: N/A;  WITH ICG   COLONOSCOPY N/A 11/20/2014   Procedure: COLONOSCOPY;  Surgeon: Reyes LELON Dessa, MD;  Location: [facility];  Service: Endoscopy;  Laterality: N/A;   CYSTOCELE REPAIR N/A 05/20/2020   Procedure: ANTERIOR COLPORRHAPHY;  Surgeon: Arloa Lamar SQUIBB, MD;  Location: [facility] ORS;  Service: Gynecology;  Laterality: N/A;   HEMORRHOID SURGERY N/A 11/20/2014   Procedure: HEMORRHOIDECTOMY;  Surgeon: Reyes LELON Dessa, MD;  Location: [facility] ORS;  Service: General;  Laterality: N/A;   VAGINAL HYSTERECTOMY N/A 05/20/2020   Procedure: HYSTERECTOMY VAGINAL;  Surgeon: Arloa Lamar SQUIBB, MD;  Location: [facility] ORS;  Service: Gynecology;  Laterality: N/A;    Family History  Problem Relation Age of Onset   Hypertension Mother 34   Colonic polyp Mother    Diabetes Father 41       TYPE 2   Hypertension Father 81   Heart attack Father    Cancer Father 98       SKIN CA IN SITU   Cancer Sister 47       ovarian, CERVICAL  Heart attack Brother    Hypertension Brother    Colon cancer Brother 43   Breast cancer Neg Hx     Social History   Socioeconomic History   Marital status: Married    Spouse name: Not on file   Number of children: 2   Years of education: 14   Highest education level: Not on file  Occupational History   Occupation: LEGAL SECRETARY  Tobacco Use   Smoking status: Never   Smokeless tobacco: Never  Vaping Use   Vaping status: Never Used  Substance and Sexual Activity   Alcohol use: No   Drug use: No   Sexual activity: Yes    Birth control/protection: None  Other Topics Concern   Not on file  Social History Narrative   Not on file   Social Drivers of Health   Financial  Resource Strain: Not on file  Food Insecurity: No Food Insecurity (11/08/2023)   Hunger Vital Sign    Worried About Running Out of Food in the Last Year: Never true    Ran Out of Food in the Last Year: Never true  Transportation Needs: No Transportation Needs (11/08/2023)   PRAPARE - Administrator, Civil Service (Medical): No    Lack of Transportation (Non-Medical): No  Physical Activity: Unknown (12/19/2017)   Exercise Vital Sign    Days of Exercise per Week: 0 days    Minutes of Exercise per Session: Not on file  Stress: Not on file  Social Connections: Not on file  Intimate Partner Violence: Not At Risk (11/08/2023)   Humiliation, Afraid, Rape, and Kick questionnaire    Fear of Current or Ex-Partner: No    Emotionally Abused: No    Physically Abused: No    Sexually Abused: No    Current Outpatient Medications on File Prior to Visit  Medication Sig Dispense Refill   acetaminophen  (TYLENOL ) 325 MG tablet Take 2 tablets (650 mg total) by mouth every 6 (six) hours as needed for mild pain (pain score 1-3) or fever (or temp > 100).     famotidine  (PEPCID ) 20 MG tablet Take 20 mg by mouth daily.     losartan -hydrochlorothiazide  (HYZAAR) 50-12.5 MG tablet Take 1 tablet by mouth daily. 90 tablet 0   oxyCODONE  (OXY IR/ROXICODONE ) 5 MG immediate release tablet Take 1-2 tablets (5-10 mg total) by mouth every 6 (six) hours as needed for moderate pain (pain score 4-6). 15 tablet 0   polyethylene glycol (MIRALAX  / GLYCOLAX ) 17 g packet Take 17 g by mouth daily as needed for mild constipation.     No current facility-administered medications on file prior to visit.    ROS:  Review of Systems  Constitutional:  Negative for fatigue, fever and unexpected weight change.  Respiratory:  Negative for cough, shortness of breath and wheezing.   Cardiovascular:  Negative for chest pain, palpitations and leg swelling.  Gastrointestinal:  Negative for blood in stool, constipation, diarrhea,  nausea and vomiting.  Endocrine: Negative for cold intolerance, heat intolerance and polyuria.  Genitourinary:  Negative for dyspareunia, dysuria, flank pain, frequency, genital sores, hematuria, menstrual problem, pelvic pain, urgency, vaginal bleeding, vaginal discharge and vaginal pain.  Musculoskeletal:  Negative for back pain, joint swelling and myalgias.  Skin:  Negative for rash.  Neurological:  Negative for dizziness, syncope, light-headedness, numbness and headaches.  Hematological:  Negative for adenopathy.  Psychiatric/Behavioral:  Negative for agitation, confusion, sleep disturbance and suicidal ideas. The patient is not nervous/anxious.  Objective: LMP 09/01/2019 (Approximate)    Physical Exam Constitutional:      Appearance: She is well-developed.  Genitourinary:     Vulva normal.     Genitourinary Comments: UTERUS/CX SURG REM     Right Labia: No rash, tenderness or lesions.    Left Labia: No tenderness, lesions or rash.    Vaginal cuff intact.    No vaginal discharge, erythema or tenderness.      Right Adnexa: not tender and no mass present.    Left Adnexa: not tender and no mass present.    Cervix is absent.     Uterus is absent.  Breasts:    Right: No mass, nipple discharge, skin change or tenderness.     Left: No mass, nipple discharge, skin change or tenderness.  Neck:     Thyroid : No thyromegaly.  Cardiovascular:     Rate and Rhythm: Normal rate and regular rhythm.     Heart sounds: Normal heart sounds. No murmur heard. Pulmonary:     Effort: Pulmonary effort is normal.     Breath sounds: Normal breath sounds.  Abdominal:     Palpations: Abdomen is soft.     Tenderness: There is no abdominal tenderness. There is no guarding.  Musculoskeletal:        General: Normal range of motion.     Cervical back: Normal range of motion.  Neurological:     General: No focal deficit present.     Mental Status: She is alert and oriented to person, place, and  time.     Cranial Nerves: No cranial nerve deficit.  Skin:    General: Skin is warm and dry.  Psychiatric:        Mood and Affect: Mood normal.        Behavior: Behavior normal.        Thought Content: Thought content normal.        Judgment: Judgment normal.  Vitals reviewed.     Assessment/Plan: Encounter for annual routine gynecological examination  Encounter for screening mammogram for malignant neoplasm of breast - Plan: MM DIAG BREAST TOMO BILATERAL, US  BREAST LTD UNI RIGHT INC AXILLA; pt to schedule mammo  Abnormal mammogram of right breast - Plan: MM DIAG BREAST TOMO BILATERAL, US  BREAST LTD UNI RIGHT INC AXILLA  Screening for colon cancer - Plan: Ambulatory referral to Gastroenterology; refer to GI  Essential hypertension--off BP meds with normal BP today. Pt to keep journal and f/u if >130/90. Check labs.   Blood tests for routine general physical examination - Plan: Comprehensive metabolic panel, Lipid panel, Hemoglobin A1c, Hemoglobin A1c, Lipid panel, Comprehensive metabolic panel  Screening cholesterol level - Plan: Lipid panel, Lipid panel  Screening for diabetes mellitus - Plan: Hemoglobin A1c, Hemoglobin A1c    GYN counsel breast self exam, mammography screening, menopause, adequate intake of calcium and vitamin D , diet and exercise     F/U  No follow-ups on file.  Tymesha Ditmore B. Lalo Tromp, PA-C 02/07/2024 1:43 PM

## 2024-02-09 ENCOUNTER — Encounter: Payer: Self-pay | Admitting: Obstetrics and Gynecology

## 2024-02-09 ENCOUNTER — Ambulatory Visit (INDEPENDENT_AMBULATORY_CARE_PROVIDER_SITE_OTHER): Admitting: Obstetrics and Gynecology

## 2024-02-09 VITALS — BP 120/81 | HR 67 | Ht 65.0 in | Wt 193.0 lb

## 2024-02-09 DIAGNOSIS — Z01419 Encounter for gynecological examination (general) (routine) without abnormal findings: Secondary | ICD-10-CM | POA: Diagnosis not present

## 2024-02-09 DIAGNOSIS — Z1231 Encounter for screening mammogram for malignant neoplasm of breast: Secondary | ICD-10-CM

## 2024-02-09 DIAGNOSIS — Z1211 Encounter for screening for malignant neoplasm of colon: Secondary | ICD-10-CM

## 2024-02-09 NOTE — Patient Instructions (Addendum)
 I value your feedback and you entrusting [place] with your care. If you get a [facility] patient survey, I would appreciate you taking the time to let us know about your experience today. Thank you!  [facility] ([place])--[phone]
